# Patient Record
Sex: Male | Born: 1935 | Race: White | Hispanic: No | Marital: Married | Smoking: Never smoker
Health system: Southern US, Community
[De-identification: ages and names within clinical notes are randomized; demographics above are authoritative.]

## PROBLEM LIST (undated history)

## (undated) DIAGNOSIS — G4733 Obstructive sleep apnea (adult) (pediatric): Secondary | ICD-10-CM

## (undated) DIAGNOSIS — I10 Essential (primary) hypertension: Secondary | ICD-10-CM

## (undated) DIAGNOSIS — F32A Depression, unspecified: Secondary | ICD-10-CM

## (undated) DIAGNOSIS — G2581 Restless legs syndrome: Secondary | ICD-10-CM

## (undated) DIAGNOSIS — K810 Acute cholecystitis: Secondary | ICD-10-CM

## (undated) DIAGNOSIS — G4700 Insomnia, unspecified: Secondary | ICD-10-CM

## (undated) DIAGNOSIS — N179 Acute kidney failure, unspecified: Secondary | ICD-10-CM

## (undated) DIAGNOSIS — F0390 Unspecified dementia without behavioral disturbance: Secondary | ICD-10-CM

## (undated) DIAGNOSIS — R6520 Severe sepsis without septic shock: Secondary | ICD-10-CM

## (undated) DIAGNOSIS — A419 Sepsis, unspecified organism: Secondary | ICD-10-CM

## (undated) DIAGNOSIS — K219 Gastro-esophageal reflux disease without esophagitis: Secondary | ICD-10-CM

## (undated) DIAGNOSIS — F0280 Dementia in other diseases classified elsewhere without behavioral disturbance: Secondary | ICD-10-CM

## (undated) DIAGNOSIS — G4730 Sleep apnea, unspecified: Secondary | ICD-10-CM

## (undated) DIAGNOSIS — F329 Major depressive disorder, single episode, unspecified: Secondary | ICD-10-CM

## (undated) DIAGNOSIS — E785 Hyperlipidemia, unspecified: Secondary | ICD-10-CM

## (undated) DIAGNOSIS — R001 Bradycardia, unspecified: Secondary | ICD-10-CM

## (undated) DIAGNOSIS — I2510 Atherosclerotic heart disease of native coronary artery without angina pectoris: Secondary | ICD-10-CM

## (undated) DIAGNOSIS — G309 Alzheimer's disease, unspecified: Secondary | ICD-10-CM

## (undated) HISTORY — PX: EYE SURGERY: SHX253

## (undated) HISTORY — DX: Atherosclerotic heart disease of native coronary artery without angina pectoris: I25.10

## (undated) HISTORY — PX: CATARACT EXTRACTION, BILATERAL: SHX1313

## (undated) HISTORY — DX: Other disorders of bilirubin metabolism: E80.6

## (undated) HISTORY — DX: Severe sepsis without septic shock: R65.20

## (undated) HISTORY — PX: CORONARY ARTERY BYPASS GRAFT: SHX141

## (undated) HISTORY — DX: Acute kidney failure, unspecified: N17.9

## (undated) HISTORY — DX: Obstructive sleep apnea (adult) (pediatric): G47.33

## (undated) HISTORY — DX: Unspecified dementia without behavioral disturbance: F03.90

## (undated) HISTORY — DX: Sepsis, unspecified organism: A41.9

## (undated) HISTORY — PX: CARDIAC CATHETERIZATION: SHX172

## (undated) HISTORY — DX: Bradycardia, unspecified: R00.1

## (undated) HISTORY — DX: Acute cholecystitis: K81.0

## (undated) HISTORY — PX: BACK SURGERY: SHX140

---

## 2016-12-17 ENCOUNTER — Emergency Department (HOSPITAL_COMMUNITY): Payer: Medicare Other

## 2016-12-17 ENCOUNTER — Encounter (HOSPITAL_COMMUNITY): Payer: Self-pay | Admitting: Obstetrics and Gynecology

## 2016-12-17 ENCOUNTER — Inpatient Hospital Stay (HOSPITAL_COMMUNITY)
Admission: EM | Admit: 2016-12-17 | Discharge: 2016-12-25 | DRG: 854 | Disposition: A | Discharge status: Home / Self Care | Payer: Medicare Other | Attending: Internal Medicine | Admitting: Internal Medicine

## 2016-12-17 DIAGNOSIS — K829 Disease of gallbladder, unspecified: Secondary | ICD-10-CM

## 2016-12-17 DIAGNOSIS — G4700 Insomnia, unspecified: Secondary | ICD-10-CM | POA: Diagnosis present

## 2016-12-17 DIAGNOSIS — G309 Alzheimer's disease, unspecified: Secondary | ICD-10-CM | POA: Diagnosis present

## 2016-12-17 DIAGNOSIS — K812 Acute cholecystitis with chronic cholecystitis: Secondary | ICD-10-CM | POA: Diagnosis present

## 2016-12-17 DIAGNOSIS — I2510 Atherosclerotic heart disease of native coronary artery without angina pectoris: Secondary | ICD-10-CM | POA: Diagnosis present

## 2016-12-17 DIAGNOSIS — F0390 Unspecified dementia without behavioral disturbance: Secondary | ICD-10-CM

## 2016-12-17 DIAGNOSIS — Z6830 Body mass index (BMI) 30.0-30.9, adult: Secondary | ICD-10-CM | POA: Diagnosis not present

## 2016-12-17 DIAGNOSIS — R410 Disorientation, unspecified: Secondary | ICD-10-CM

## 2016-12-17 DIAGNOSIS — K810 Acute cholecystitis: Secondary | ICD-10-CM | POA: Diagnosis present

## 2016-12-17 DIAGNOSIS — K830 Cholangitis: Secondary | ICD-10-CM | POA: Diagnosis present

## 2016-12-17 DIAGNOSIS — K219 Gastro-esophageal reflux disease without esophagitis: Secondary | ICD-10-CM | POA: Diagnosis present

## 2016-12-17 DIAGNOSIS — E785 Hyperlipidemia, unspecified: Secondary | ICD-10-CM | POA: Diagnosis present

## 2016-12-17 DIAGNOSIS — I10 Essential (primary) hypertension: Secondary | ICD-10-CM | POA: Diagnosis present

## 2016-12-17 DIAGNOSIS — F03918 Unspecified dementia, unspecified severity, with other behavioral disturbance: Secondary | ICD-10-CM | POA: Diagnosis present

## 2016-12-17 DIAGNOSIS — G2581 Restless legs syndrome: Secondary | ICD-10-CM | POA: Diagnosis present

## 2016-12-17 DIAGNOSIS — G4730 Sleep apnea, unspecified: Secondary | ICD-10-CM | POA: Diagnosis present

## 2016-12-17 DIAGNOSIS — E872 Acidosis: Secondary | ICD-10-CM | POA: Diagnosis present

## 2016-12-17 DIAGNOSIS — K819 Cholecystitis, unspecified: Secondary | ICD-10-CM

## 2016-12-17 DIAGNOSIS — G4733 Obstructive sleep apnea (adult) (pediatric): Secondary | ICD-10-CM | POA: Diagnosis present

## 2016-12-17 DIAGNOSIS — K8309 Other cholangitis: Secondary | ICD-10-CM | POA: Diagnosis present

## 2016-12-17 DIAGNOSIS — Z79899 Other long term (current) drug therapy: Secondary | ICD-10-CM | POA: Diagnosis not present

## 2016-12-17 DIAGNOSIS — E876 Hypokalemia: Secondary | ICD-10-CM | POA: Diagnosis present

## 2016-12-17 DIAGNOSIS — Z7982 Long term (current) use of aspirin: Secondary | ICD-10-CM

## 2016-12-17 DIAGNOSIS — E669 Obesity, unspecified: Secondary | ICD-10-CM | POA: Diagnosis present

## 2016-12-17 DIAGNOSIS — R4182 Altered mental status, unspecified: Secondary | ICD-10-CM | POA: Diagnosis not present

## 2016-12-17 DIAGNOSIS — A419 Sepsis, unspecified organism: Secondary | ICD-10-CM | POA: Diagnosis present

## 2016-12-17 DIAGNOSIS — Z951 Presence of aortocoronary bypass graft: Secondary | ICD-10-CM | POA: Diagnosis not present

## 2016-12-17 DIAGNOSIS — R6520 Severe sepsis without septic shock: Secondary | ICD-10-CM | POA: Diagnosis present

## 2016-12-17 DIAGNOSIS — R400 Somnolence: Secondary | ICD-10-CM | POA: Diagnosis not present

## 2016-12-17 DIAGNOSIS — F0280 Dementia in other diseases classified elsewhere without behavioral disturbance: Secondary | ICD-10-CM | POA: Diagnosis present

## 2016-12-17 DIAGNOSIS — Z792 Long term (current) use of antibiotics: Secondary | ICD-10-CM | POA: Diagnosis not present

## 2016-12-17 DIAGNOSIS — F0391 Unspecified dementia with behavioral disturbance: Secondary | ICD-10-CM | POA: Diagnosis present

## 2016-12-17 DIAGNOSIS — R32 Unspecified urinary incontinence: Secondary | ICD-10-CM | POA: Diagnosis present

## 2016-12-17 DIAGNOSIS — K651 Peritoneal abscess: Secondary | ICD-10-CM

## 2016-12-17 DIAGNOSIS — N179 Acute kidney failure, unspecified: Secondary | ICD-10-CM

## 2016-12-17 DIAGNOSIS — R001 Bradycardia, unspecified: Secondary | ICD-10-CM

## 2016-12-17 HISTORY — DX: Atherosclerotic heart disease of native coronary artery without angina pectoris: I25.10

## 2016-12-17 HISTORY — DX: Alzheimer's disease, unspecified: G30.9

## 2016-12-17 HISTORY — DX: Acute cholecystitis: K81.0

## 2016-12-17 HISTORY — DX: Sepsis, unspecified organism: A41.9

## 2016-12-17 HISTORY — DX: Obstructive sleep apnea (adult) (pediatric): G47.33

## 2016-12-17 HISTORY — DX: Dementia in other diseases classified elsewhere, unspecified severity, without behavioral disturbance, psychotic disturbance, mood disturbance, and anxiety: F02.80

## 2016-12-17 HISTORY — DX: Restless legs syndrome: G25.81

## 2016-12-17 HISTORY — DX: Sleep apnea, unspecified: G47.30

## 2016-12-17 HISTORY — DX: Major depressive disorder, single episode, unspecified: F32.9

## 2016-12-17 HISTORY — DX: Insomnia, unspecified: G47.00

## 2016-12-17 HISTORY — DX: Unspecified dementia, unspecified severity, without behavioral disturbance, psychotic disturbance, mood disturbance, and anxiety: F03.90

## 2016-12-17 HISTORY — DX: Essential (primary) hypertension: I10

## 2016-12-17 HISTORY — DX: Gastro-esophageal reflux disease without esophagitis: K21.9

## 2016-12-17 HISTORY — DX: Hyperlipidemia, unspecified: E78.5

## 2016-12-17 HISTORY — DX: Depression, unspecified: F32.A

## 2016-12-17 HISTORY — DX: Sepsis, unspecified organism: R65.20

## 2016-12-17 LAB — CBC WITH DIFFERENTIAL/PLATELET
BASOS ABS: 0 10*3/uL (ref 0.0–0.1)
Basophils Relative: 0 %
EOS PCT: 0 %
Eosinophils Absolute: 0 10*3/uL (ref 0.0–0.7)
HEMATOCRIT: 44.8 % (ref 39.0–52.0)
Hemoglobin: 14.7 g/dL (ref 13.0–17.0)
LYMPHS ABS: 0.5 10*3/uL — AB (ref 0.7–4.0)
LYMPHS PCT: 4 %
MCH: 30.6 pg (ref 26.0–34.0)
MCHC: 32.8 g/dL (ref 30.0–36.0)
MCV: 93.1 fL (ref 78.0–100.0)
Monocytes Absolute: 0.7 10*3/uL (ref 0.1–1.0)
Monocytes Relative: 5 %
NEUTROS ABS: 11.7 10*3/uL — AB (ref 1.7–7.7)
Neutrophils Relative %: 91 %
PLATELETS: 159 10*3/uL (ref 150–400)
RBC: 4.81 MIL/uL (ref 4.22–5.81)
RDW: 13.5 % (ref 11.5–15.5)
WBC: 12.9 10*3/uL — AB (ref 4.0–10.5)

## 2016-12-17 LAB — URINALYSIS, ROUTINE W REFLEX MICROSCOPIC
BILIRUBIN URINE: NEGATIVE
Glucose, UA: NEGATIVE mg/dL
HGB URINE DIPSTICK: NEGATIVE
KETONES UR: NEGATIVE mg/dL
LEUKOCYTES UA: NEGATIVE
Nitrite: NEGATIVE
PROTEIN: 30 mg/dL — AB
SPECIFIC GRAVITY, URINE: 1.026 (ref 1.005–1.030)
Squamous Epithelial / HPF: NONE SEEN
pH: 5 (ref 5.0–8.0)

## 2016-12-17 LAB — COMPREHENSIVE METABOLIC PANEL
ALBUMIN: 3.7 g/dL (ref 3.5–5.0)
ALT: 16 U/L — ABNORMAL LOW (ref 17–63)
ALT: 14 U/L — AB (ref 17–63)
ANION GAP: 9 (ref 5–15)
AST: 26 U/L (ref 15–41)
AST: 24 U/L (ref 15–41)
Albumin: 3.1 g/dL — ABNORMAL LOW (ref 3.5–5.0)
Alkaline Phosphatase: 105 U/L (ref 38–126)
Alkaline Phosphatase: 86 U/L (ref 38–126)
Anion gap: 9 (ref 5–15)
BILIRUBIN TOTAL: 1.4 mg/dL — AB (ref 0.3–1.2)
BUN: 26 mg/dL — ABNORMAL HIGH (ref 6–20)
BUN: 26 mg/dL — AB (ref 6–20)
CALCIUM: 8 mg/dL — AB (ref 8.9–10.3)
CHLORIDE: 104 mmol/L (ref 101–111)
CO2: 24 mmol/L (ref 22–32)
CO2: 24 mmol/L (ref 22–32)
CREATININE: 1.45 mg/dL — AB (ref 0.61–1.24)
Calcium: 8.7 mg/dL — ABNORMAL LOW (ref 8.9–10.3)
Chloride: 106 mmol/L (ref 101–111)
Creatinine, Ser: 1.42 mg/dL — ABNORMAL HIGH (ref 0.61–1.24)
GFR calc Af Amer: 51 mL/min — ABNORMAL LOW (ref >60)
GFR calc Af Amer: 50 mL/min — ABNORMAL LOW (ref >60)
GFR, EST NON AFRICAN AMERICAN: 44 mL/min — AB (ref >60)
GFR, EST NON AFRICAN AMERICAN: 43 mL/min — AB (ref >60)
GLUCOSE: 143 mg/dL — AB (ref 65–99)
Glucose, Bld: 141 mg/dL — ABNORMAL HIGH (ref 65–99)
POTASSIUM: 4.8 mmol/L (ref 3.5–5.1)
POTASSIUM: 4.6 mmol/L (ref 3.5–5.1)
Sodium: 137 mmol/L (ref 135–145)
Sodium: 139 mmol/L (ref 135–145)
TOTAL PROTEIN: 7.2 g/dL (ref 6.5–8.1)
TOTAL PROTEIN: 6.4 g/dL — AB (ref 6.5–8.1)
Total Bilirubin: 1.9 mg/dL — ABNORMAL HIGH (ref 0.3–1.2)

## 2016-12-17 LAB — BLOOD GAS, VENOUS
Acid-base deficit: 0.4 mmol/L (ref 0.0–2.0)
BICARBONATE: 24.4 mmol/L (ref 20.0–28.0)
O2 Saturation: 62.1 %
PATIENT TEMPERATURE: 98.6
PH VEN: 7.373 (ref 7.250–7.430)
pCO2, Ven: 43 mmHg — ABNORMAL LOW (ref 44.0–60.0)
pO2, Ven: 32.3 mmHg (ref 32.0–45.0)

## 2016-12-17 LAB — I-STAT CG4 LACTIC ACID, ED
LACTIC ACID, VENOUS: 2.23 mmol/L — AB (ref 0.5–1.9)
Lactic Acid, Venous: 2.25 mmol/L (ref 0.5–1.9)

## 2016-12-17 LAB — PROTIME-INR
INR: 1.17
Prothrombin Time: 15 seconds (ref 11.4–15.2)

## 2016-12-17 LAB — LACTIC ACID, PLASMA: LACTIC ACID, VENOUS: 1.6 mmol/L (ref 0.5–1.9)

## 2016-12-17 LAB — LIPASE, BLOOD: Lipase: 32 U/L (ref 11–51)

## 2016-12-17 LAB — PROCALCITONIN: Procalcitonin: 2.39 ng/mL

## 2016-12-17 LAB — APTT: aPTT: 32 seconds (ref 24–36)

## 2016-12-17 MED ORDER — SODIUM CHLORIDE 0.9% FLUSH
3.0000 mL | Freq: Two times a day (BID) | INTRAVENOUS | Status: DC
  Administered 2016-12-17 – 2016-12-24 (×9): 3 mL via INTRAVENOUS

## 2016-12-17 MED ORDER — HYDROMORPHONE HCL 1 MG/ML IJ SOLN: 0.5000 mg | INTRAMUSCULAR | Status: DC | PRN

## 2016-12-17 MED ORDER — CHLORHEXIDINE GLUCONATE 0.12 % MT SOLN
15.0000 mL | Freq: Two times a day (BID) | OROMUCOSAL | Status: DC
  Administered 2016-12-18 – 2016-12-25 (×12): 15 mL via OROMUCOSAL
  Filled 2016-12-17 (×12): qty 15

## 2016-12-17 MED ORDER — IOPAMIDOL (ISOVUE-300) INJECTION 61%
INTRAVENOUS | Status: AC
  Filled 2016-12-17: qty 75

## 2016-12-17 MED ORDER — VANCOMYCIN HCL 10 G IV SOLR
1500.0000 mg | Freq: Once | INTRAVENOUS | Status: AC
  Administered 2016-12-17: 1500 mg via INTRAVENOUS
  Filled 2016-12-17: qty 1500

## 2016-12-17 MED ORDER — ACETAMINOPHEN 325 MG PO TABS
650.0000 mg | ORAL_TABLET | Freq: Four times a day (QID) | ORAL | Status: DC | PRN
  Administered 2016-12-20: 650 mg via ORAL
  Filled 2016-12-17: qty 2

## 2016-12-17 MED ORDER — ORAL CARE MOUTH RINSE
15.0000 mL | Freq: Two times a day (BID) | OROMUCOSAL | Status: DC
  Administered 2016-12-18 – 2016-12-24 (×8): 15 mL via OROMUCOSAL

## 2016-12-17 MED ORDER — ACETAMINOPHEN 650 MG RE SUPP
650.0000 mg | Freq: Four times a day (QID) | RECTAL | Status: DC | PRN
  Administered 2016-12-18: 650 mg via RECTAL
  Filled 2016-12-17: qty 1

## 2016-12-17 MED ORDER — PIPERACILLIN-TAZOBACTAM 3.375 G IVPB
3.3750 g | Freq: Three times a day (TID) | INTRAVENOUS | Status: AC
  Administered 2016-12-17 – 2016-12-24 (×20): 3.375 g via INTRAVENOUS
  Filled 2016-12-17 (×19): qty 50

## 2016-12-17 MED ORDER — PIPERACILLIN-TAZOBACTAM 3.375 G IVPB 30 MIN
3.3750 g | Freq: Once | INTRAVENOUS | Status: AC
  Administered 2016-12-17: 3.375 g via INTRAVENOUS
  Filled 2016-12-17: qty 50

## 2016-12-17 MED ORDER — ONDANSETRON HCL 4 MG PO TABS
4.0000 mg | ORAL_TABLET | Freq: Four times a day (QID) | ORAL | Status: DC | PRN
  Filled 2016-12-17: qty 1

## 2016-12-17 MED ORDER — SODIUM CHLORIDE 0.9 % IV SOLN
INTRAVENOUS | Status: AC
  Administered 2016-12-17: 23:00:00 via INTRAVENOUS

## 2016-12-17 MED ORDER — ONDANSETRON HCL 4 MG/2ML IJ SOLN: 4.0000 mg | Freq: Four times a day (QID) | INTRAMUSCULAR | Status: DC | PRN

## 2016-12-17 MED ORDER — SODIUM CHLORIDE 0.9 % IV SOLN
Freq: Once | INTRAVENOUS | Status: AC
  Administered 2016-12-17: 22:00:00 via INTRAVENOUS

## 2016-12-17 MED ORDER — SODIUM CHLORIDE 0.9 % IV BOLUS (SEPSIS)
500.0000 mL | Freq: Once | INTRAVENOUS | Status: AC
  Administered 2016-12-17: 500 mL via INTRAVENOUS

## 2016-12-17 MED ORDER — IOPAMIDOL (ISOVUE-300) INJECTION 61%
75.0000 mL | Freq: Once | INTRAVENOUS | Status: AC | PRN
  Administered 2016-12-17: 75 mL via INTRAVENOUS

## 2016-12-17 MED ORDER — QUETIAPINE FUMARATE 50 MG PO TABS: 25.0000 mg | ORAL_TABLET | Freq: Every day | ORAL | Status: DC

## 2016-12-17 NOTE — ED Notes (Signed)
Hospitalist at bedside 

## 2016-12-17 NOTE — ED Notes (Signed)
Patient transported to CT 

## 2016-12-17 NOTE — Progress Notes (Signed)
Pharmacy Antibiotic Note  Travis MoloneyJohn Wallace is a 83 y.o. male with hx dementia presented to the ED on 12/17/2016 with c/o AMS. Abd CT with findings consistent with acute cholecystitis.  To start zosyn for intra-abdominal infection.   Plan: - zosyn 3.375 gm IV q8h (infuse over 4 hours) - monitor renal function  ______________________________  Height: 5\' 4"  (162.6 cm) Weight: 180 lb (81.6 kg) IBW/kg (Calculated) : 59.2  Temp (24hrs), Avg:97.7 F (36.5 C), Min:97.7 F (36.5 C), Max:97.7 F (36.5 C)   Recent Labs Lab 12/17/16 1154 12/17/16 1205  WBC 12.9*  --   CREATININE 1.42*  --   LATICACIDVEN  --  2.25*    Estimated Creatinine Clearance: 38 mL/min (A) (by C-G formula based on SCr of 1.42 mg/dL (H)).    No Known Allergies  Thank you for allowing pharmacy to be a part of this patient's care.  Travis Wallace, Travis Wallace 12/17/2016 4:36 PM

## 2016-12-17 NOTE — ED Notes (Signed)
Attempted an IV x 2 with no success. Will place for IV team since two nurses has attempted to obtain an IV.

## 2016-12-17 NOTE — ED Provider Notes (Signed)
Notified by surgery recommendation to admit to hospitalist. Labs, records reviewed in detail. Patient is here with acute, uncomplicated cholecystitis with concern for developing cholangitis, with fever, leukocytosis, and lactic acidosis. I have ordered IVF, mIVF, and Zosyn has been given. Awaiting Hospitalist admit.   Shaune PollackIsaacs, Leyland Kenna, MD 12/17/16 [phone]
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
## 2016-12-17 NOTE — ED Notes (Signed)
Attempted IV X2. Unsuccessful. 

## 2016-12-17 NOTE — ED Triage Notes (Signed)
Per EMS:  Pt coming from [place]: Pt is lethargic and has been sleeping more Pt is able to get up and walk around normally, but today he is not   116/47 97% on 2L 64 HR 136 CBG Temp: 101.4

## 2016-12-17 NOTE — ED Notes (Signed)
Pt's family reports that the pt "is sun downing" and he needs his Seroquel and something for restless leg.

## 2016-12-17 NOTE — H&P (Signed)
Travis Wallace QIO:[id number] DOB: 08/02/1930 DOA: 12/17/2016     PCP: Doreen Salvage, PA-C   Outpatient Specialists: none Patient coming from:  From facility [facility]  Chief Complaint: Fever increased confusion and weakness generalized  HPI: Travis Wallace is a 83 y.o. male with medical history significant of CAD,  HTN, HLD, dementia  Presented with 1-2 day history of worsening confusion and lethargy fevers he has not been abdomen endorsing chest pain or abdominal pain but unable to provide detailed history. Family 2 days ago she was noted to be more lethargic and was seen at [facility] hospital day 5 that he may have some sludge in his gallbladder but at that time showed no acute cholecystitis he was at that point complaining of some epigastric/chest pain and evaluated for that small from a standpoint of ACS rule out. He continued to she'll poorly and developed a fever he was too weak to stand up    Regarding pertinent Chronic problems: History of coronary artery disease had CABG at age 30 his been living in memory unit for past 3 years secondary to dementia   IN ER:  Temp (24hrs), Avg:98.5 F (36.9 C), Min:97.7 F (36.5 C), Max:99.3 F (37.4 C)      on arrival  ED Triage Vitals  Enc Vitals Group     BP 12/17/16 1147 (!) 135/48     Pulse Rate 12/17/16 1147 71     Resp 12/17/16 1147 17     Temp 12/17/16 1147 97.7 F (36.5 C)     Temp Source 12/17/16 1147 Oral     SpO2 12/17/16 1147 93 %     Weight 12/17/16 1437 180 lb (81.6 kg)     Height 12/17/16 1437 5\' 4"  (1.626 m)     Head Circumference --      Peak Flow --      Pain Score --      Pain Loc --      Pain Edu? --      Excl. in GC? --   RRR 12 satting 91% HR 77 BP 106/59 Lactic acid 2.25 now 2.23 Sodium 139  K4.6 creatinine 1.4 to a plan 1.1   2 days ago WBC 12.9 hemoglobin 14.7 VBG 7.373/43 CT abdomen acute cholecystitis Chest x-ray nonacute CT non acute Following Medications were ordered in ER: Medications    iopamidol (ISOVUE-300) 61 % injection (not administered)  piperacillin-tazobactam (ZOSYN) IVPB 3.375 g (not administered)  QUEtiapine (SEROQUEL) tablet 25 mg (not administered)  0.9 %  sodium chloride infusion (not administered)  sodium chloride 0.9 % bolus 500 mL (not administered)  piperacillin-tazobactam (ZOSYN) IVPB 3.375 g (0 g Intravenous Stopped 12/17/16 1533)  vancomycin (VANCOCIN) 1,500 mg in sodium chloride 0.9 % 500 mL IVPB (0 mg Intravenous Stopped 12/17/16 1833)  iopamidol (ISOVUE-300) 61 % injection 75 mL (75 mLs Intravenous Contrast Given 12/17/16 1406)     ER provider discussed case with:  General surgery who recommended admission to medicine on  Antibiotics and they will follow  Hospitalist was called for admission for acute cholecystitis  Review of Systems:    Pertinent positives include: Fevers, chills, fatigue,   Constitutional:  No weight loss, night sweats,weight loss  HEENT:  No headaches, Difficulty swallowing,Tooth/dental problems,Sore throat,  No sneezing, itching, ear ache, nasal congestion, post nasal drip,  Cardio-vascular:  No chest pain, Orthopnea, PND, anasarca, dizziness, palpitations.no Bilateral lower extremity swelling  GI:  No heartburn, indigestion, abdominal pain, nausea, vomiting, diarrhea, change in bowel habits,  loss of appetite, melena, blood in stool, hematemesis Resp:  no shortness of breath at rest. No dyspnea on exertion, No excess mucus, no productive cough, No non-productive cough, No coughing up of blood.No change in color of mucus.No wheezing. Skin:  no rash or lesions. No jaundice GU:  no dysuria, change in color of urine, no urgency or frequency. No straining to urinate.  No flank pain.  Musculoskeletal:  No joint pain or no joint swelling. No decreased range of motion. No back pain.  Psych:  No change in mood or affect. No depression or anxiety. No memory loss.  Neuro: no localizing neurological complaints, no tingling, no  weakness, no double vision, no gait abnormality, no slurred speech, no confusion  As per HPI otherwise 10 point review of systems negative.   Past Medical History: Past Medical History:  Diagnosis Date  . Alzheimer's dementia   . Coronary artery disease   . Depression   . Dyslipidemia   . GERD (gastroesophageal reflux disease)   . Hypertension   . Insomnia   . Restless leg   . Sleep apnea    Past Surgical History:  Procedure Laterality Date  . BACK SURGERY     x2  . CARDIAC CATHETERIZATION    . CATARACT EXTRACTION, BILATERAL    . CORONARY ARTERY BYPASS GRAFT     X 3 VESSELS  . EYE SURGERY       Social History:  Ambulatory  walker       reports that he has never smoked. He has never used smokeless tobacco. He reports that he does not drink alcohol or use drugs.  Allergies:  No Known Allergies     Family History:   Family History  Problem Relation Age of Onset  . Pancreatic cancer Mother   . Hypertension Father   . CAD Father   . Stroke Father   . CAD Sister   . Diabetes Neg Hx     Medications: Prior to Admission medications   Medication Sig Start Date End Date Taking? Authorizing Provider  amLODipine-benazepril (LOTREL) 5-20 MG capsule Take 1 capsule by mouth daily. 11/03/14  Yes [provider]  aspirin EC 81 MG tablet Take 81 mg by mouth daily.   Yes [provider]  atorvastatin (LIPITOR) 20 MG tablet Take 20 mg by mouth at bedtime.   Yes [provider]  cetirizine (ZYRTEC) 10 MG tablet Take 10 mg by mouth daily.   Yes [provider]  doxazosin (CARDURA) 4 MG tablet Take 4 mg by mouth daily.   Yes [provider]  lactulose (CHRONULAC) 10 GM/15ML solution Take 30 mLs by mouth daily. 11/13/16  Yes [provider]  lansoprazole (PREVACID) 30 MG capsule Take 30 mg by mouth daily.   Yes [provider]  magnesium gluconate (MAGONATE) 500 MG tablet Take 500 mg by mouth daily.   Yes [provider]  Memantine HCl-Donepezil HCl (NAMZARIC) 28-10 MG CP24 Take 1 capsule by mouth daily.   Yes [provider]  niacin 500 MG tablet Take 500 mg by mouth daily.   Yes [provider]  QUEtiapine (SEROQUEL) 25 MG tablet Take 75 mg by mouth at bedtime. 12/05/16  Yes [provider]  QUEtiapine (SEROQUEL) 25 MG tablet Take 25 mg by mouth daily as needed (restlessness).   Yes [provider]  REQUIP 1 MG tablet Take 1 mg by mouth at bedtime.  11/07/16  Yes [provider]  traMADol Janean Sark) 50  MG tablet Take 50 mg by mouth 2 (two) times daily. 12/04/16  Yes [provider]    Physical Exam: Patient Vitals for the past 24 hrs:  BP Temp Temp src Pulse Resp SpO2 Height Weight  12/17/16 1943 (!) 104/59 99.3 F (37.4 C) Oral 77 12 91 % - -  12/17/16 1530 116/62 - - 85 (!) 25 92 % - -  12/17/16 1506 114/62 - - - 17 - - -  12/17/16 1449 (!) 139/54 - - 68 19 94 % - -  12/17/16 1437 - - - - - - 5\' 4"  (1.626 m) 81.6 kg (180 lb)  12/17/16 1147 (!) 135/48 97.7 F (36.5 C) Oral 71 17 93 % - -    1. General: Patient moaning 2. Psychological: Alert but not Oriented 3. Head/ENT:   Dry Mucous Membranes                          Head Non traumatic, neck supple                          Poor Dentition 4. SKIN: decreased Skin turgor,  Skin clean Dry and intact no rash 5. Heart: Regular rate and rhythm no Murmur, Rub or gallop 6. Lungs:   no wheezes or crackles   7. Abdomen: Soft, RUQ tenderness,  Distended With diminished bowel sounds 8. Lower extremities: no clubbing, cyanosis, or edema 9. Neurologically Grossly intact, moving all 4 extremities equally  10. MSK: Normal range of motion   body mass index is 30.9 kg/m.  Labs on Admission:   Labs on Admission: I have personally reviewed following labs and imaging studies  CBC:  Recent Labs Lab 12/17/16 1154  WBC 12.9*  NEUTROABS 11.7*  HGB 14.7  HCT 44.8  MCV 93.1  PLT 159   Basic  Metabolic Panel:  Recent Labs Lab 12/17/16 1154 12/17/16 1700  NA 137 139  K 4.8 4.6  CL 104 106  CO2 24 24  GLUCOSE 143* 141*  BUN 26* 26*  CREATININE 1.42* 1.45*  CALCIUM 8.7* 8.0*   GFR: Estimated Creatinine Clearance: 37.2 mL/min (A) (by C-G formula based on SCr of 1.45 mg/dL (H)). Liver Function Tests:  Recent Labs Lab 12/17/16 1154 12/17/16 1700  AST 26 24  ALT 16* 14*  ALKPHOS 105 86  BILITOT 1.9* 1.4*  PROT 7.2 6.4*  ALBUMIN 3.7 3.1*    Recent Labs Lab 12/17/16 1154  LIPASE 32   No results for input(s): AMMONIA in the last 168 hours. Coagulation Profile: No results for input(s): INR, PROTIME in the last 168 hours. Cardiac Enzymes: No results for input(s): CKTOTAL, CKMB, CKMBINDEX, TROPONINI in the last 168 hours. BNP (last 3 results) No results for input(s): PROBNP in the last 8760 hours. HbA1C: No results for input(s): HGBA1C in the last 72 hours. CBG: No results for input(s): GLUCAP in the last 168 hours. Lipid Profile: No results for input(s): CHOL, HDL, LDLCALC, TRIG, CHOLHDL, LDLDIRECT in the last 72 hours. Thyroid Function Tests: No results for input(s): TSH, T4TOTAL, FREET4, T3FREE, THYROIDAB in the last 72 hours. Anemia Panel: No results for input(s): VITAMINB12, FOLATE, FERRITIN, TIBC, IRON, RETICCTPCT in the last 72 hours. Urine analysis:    Component Value Date/Time   COLORURINE YELLOW 12/17/2016 1154   APPEARANCEUR CLEAR 12/17/2016 1154   LABSPEC 1.026 12/17/2016 1154   PHURINE 5.0 12/17/2016 1154   GLUCOSEU NEGATIVE 12/17/2016 1154  HGBUR NEGATIVE 12/17/2016 1154   BILIRUBINUR NEGATIVE 12/17/2016 1154   KETONESUR NEGATIVE 12/17/2016 1154   PROTEINUR 30 (A) 12/17/2016 1154   NITRITE NEGATIVE 12/17/2016 1154   LEUKOCYTESUR NEGATIVE 12/17/2016 1154   Sepsis Labs: @LABRCNTIP (procalcitonin:4,lacticidven:4) )No results found for this or any previous visit (from the past 240 hour(s)).     UA no evidence of UTI    No results  found for: HGBA1C  Estimated Creatinine Clearance: 37.2 mL/min (A) (by C-G formula based on SCr of 1.45 mg/dL (H)).  BNP (last 3 results) No results for input(s): PROBNP in the last 8760 hours.   ECG REPORT  Independently reviewed Rate:57  Rhythm: Sinus bradycardia and PACs ST&T Change: No acute ischemic changes  QTC 372  Filed Weights   12/17/16 1437  Weight: 81.6 kg (180 lb)     Cultures: No results found for: SDES, SPECREQUEST, CULT, REPTSTATUS   Radiological Exams on Admission: Dg Chest 2 View  Result Date: 12/17/2016 CLINICAL DATA:  Sepsis.  Lethargic. EXAM: CHEST  2 VIEW COMPARISON:  None FINDINGS: Two views of the chest demonstrate low lung volumes. No focal airspace disease or frank pulmonary edema. Prior median sternotomy. Surgical hardware in the lumbar spine. Aortic atherosclerosis. Heart size is within normal limits. IMPRESSION: Low lung volumes.  No acute findings. Electronically Signed   By: Richarda OverlieAdam  Henn M.D.   On: 12/17/2016 13:17   Ct Head Wo Contrast  Result Date: 12/17/2016 CLINICAL DATA:  Sepsis EXAM: CT HEAD WITHOUT CONTRAST TECHNIQUE: Contiguous axial images were obtained from the base of the skull through the vertex without intravenous contrast. COMPARISON:  None. FINDINGS: Brain: Moderate atrophy. Negative for hydrocephalus. Mild chronic microvascular ischemic change in the white matter and basal ganglia. Negative for acute infarct.  Negative for hemorrhage or mass. Vascular: Negative for hyperdense vessel Skull: Negative Sinuses/Orbits: Mucous retention cyst right maxillary sinus. Air-fluid level in the sphenoid sinus. Other: None IMPRESSION: Atrophy and chronic microvascular ischemic change. No acute intracranial abnormality Air-fluid level sphenoid sinus. Electronically Signed   By: Marlan Palauharles  Clark M.D.   On: 12/17/2016 12:37   Ct Abdomen Pelvis W Contrast  Result Date: 12/17/2016 CLINICAL DATA:  Upper quadrant pain. EXAM: CT ABDOMEN AND PELVIS WITH CONTRAST  TECHNIQUE: Multidetector CT imaging of the abdomen and pelvis was performed using the standard protocol following bolus administration of intravenous contrast. CONTRAST:  75mL ISOVUE-300 IOPAMIDOL (ISOVUE-300) INJECTION 61% COMPARISON:  None available FINDINGS: Lower chest: Trace bilateral effusions greater on the RIGHT. Hepatobiliary: Several low-density lesions in liver consistent benign hepatic cyst. No biliary duct dilatation. Gallbladder is distended to 4 cm. There is pericholecystic inflammation in the fat adjacent to the gallbladder. There is a calculus in the neck of the gallbladder measuring 10 mm (image 25, series 3). Small amount pericholecystic fluid. The distal common bile duct is upper limits normal at 6 mm. No common bile duct stone identified by CT. Pancreas: No pancreatic inflammation or duct dilatation. Spleen: Normal spleen Adrenals/urinary tract: Adrenal glands and kidneys are normal. The ureters and bladder normal. Stomach/Bowel: Stomach and small bowel are normal. Appendix not identified. Surgical clips in the cecal region. Colon rectosigmoid colon normal. Vascular/Lymphatic: Abdominal aorta is normal caliber with atherosclerotic calcification. There is no retroperitoneal or periportal lymphadenopathy. No pelvic lymphadenopathy. Reproductive: Prostate normal Other: No free fluid. Musculoskeletal: No aggressive osseous lesion. IMPRESSION: 1. Findings consistent with acute cholecystitis. 2. Common bile duct upper limits of normal. 3.  Aortic Atherosclerosis (ICD10-I70.0). Electronically Signed   By: Genevive BiStewart  Edmunds  M.D.   On: 12/17/2016 14:35    Chart has been reviewed    Assessment/Plan  83 y.o. male with medical history significant of CAD,  HTN, HLD, dementia Admitted for sepsis due to cholecystitis surgery is aware  Present on Admission: . Acute cholecystitis - appreciate surgical consult, will treat with IV antibiotics were today continue Zosyn, await results of blood cultures,  keep nothing by mouth and rehydrate. defer to surgery  regarding    timing of cholecystectomy  . Severe sepsis (HCC) -  Admit per Sepsis protocol likely source being  intra-abdominal infection  - rehydrate with 25ml/kg  - initiate broad spectrum antibiotics  Zosyn  -  obtain blood cultures  - Obtain serial lactic acid  - Obtain procalcitonin level  - Admit and monitor vital signs closely  - PCCM has been made aware  Sepsis - Repeat Assessment  Performed at:    2100  Vitals     Blood pressure (!) 128/110, pulse 72, temperature 98.8 F (37.1 C), temperature source Oral, resp. rate (!) 21, height 5\' 4"  (1.626 m), weight 80.2 kg (176 lb 12.9 oz), SpO2 96 %.  Heart:     Tachycardic  Lungs:    CTA  Capillary Refill:   <2 sec  Peripheral Pulse:   Radial pulse palpable  Skin:     Flushed    . CAD (coronary artery disease) - stable, hold home medications while NPO, per wife has been asymptomatic since original CABG years ago,  . HTN (hypertension) - hold home medications while septic allow  permissive hypertension rehydrate . OSA (obstructive sleep apnea)- patient does not tolerate C Pap  . Dementia - expect some degree of sundowning, patient altered at baseline but due to infection have been more significantly confused.   Other plan as per orders.  DVT prophylaxis:  SCD    Code Status:  FULL COD as per family   Family Communication:   Family   at  Bedside  plan of care was discussed with   Daughter,   Disposition Plan:                              Back to current facility when stable                                                   Social Work    consulted                          Consults called: General surgery, PCCM aware for E-link  Admission status:   inpatient      Level of care       SDU      I have spent a total of 66 min on this admission  time was taken to discuss case with consultants  Natally Ribera 12/18/2016, 12:29 AM    [facility]    Pager [phone]   after 2 AM please page floor coverage PA If 7AM-7PM, please contact the day team taking care of the patient  [email or web address]  Password [email or web address]

## 2016-12-17 NOTE — ED Provider Notes (Signed)
[facility]-EMERGENCY DEPT Provider Note   CSN: [id number] Arrival date & time: 12/17/16  1131     History   Chief Complaint Chief Complaint  Patient presents with  . Fatigue    HPI Travis MoloneyJohn Wallace is a 83 y.o. male.  83 yo M with chief complaints of fatigue. Level V caveat altered mental status. Per the nursing home the patient has been febrile and sleeping more than normal. No further history obtained by the nursing home.   The history is provided by the patient.  Illness  This is a new problem. The current episode started 12 to 24 hours ago. The problem occurs constantly. The problem has not changed since onset.Pertinent negatives include no chest pain, no abdominal pain, no headaches and no shortness of breath. Nothing aggravates the symptoms. Nothing relieves the symptoms. He has tried nothing for the symptoms. The treatment provided no relief.    History reviewed. No pertinent past medical history.  There are no active problems to display for this patient.   History reviewed. No pertinent surgical history.     Home Medications    Prior to Admission medications   Medication Sig Start Date End Date Taking? Authorizing Provider  amLODipine-benazepril (LOTREL) 5-20 MG capsule Take 1 capsule by mouth daily. 11/03/14  Yes [provider]  aspirin EC 81 MG tablet Take 81 mg by mouth daily.   Yes [provider]  atorvastatin (LIPITOR) 20 MG tablet Take 20 mg by mouth at bedtime.   Yes [provider]  cetirizine (ZYRTEC) 10 MG tablet Take 10 mg by mouth daily.   Yes [provider]  doxazosin (CARDURA) 4 MG tablet Take 4 mg by mouth daily.   Yes [provider]  lactulose (CHRONULAC) 10 GM/15ML solution Take 30 mLs by mouth daily. 11/13/16  Yes [provider]  lansoprazole (PREVACID) 30 MG capsule Take 30 mg by mouth daily.   Yes [provider]  magnesium gluconate (MAGONATE) 500 MG tablet Take 500 mg by mouth daily.    Yes [provider]  Memantine HCl-Donepezil HCl (NAMZARIC) 28-10 MG CP24 Take 1 capsule by mouth daily.   Yes [provider]  niacin 500 MG tablet Take 500 mg by mouth daily.   Yes [provider]  QUEtiapine (SEROQUEL) 25 MG tablet Take 75 mg by mouth at bedtime. 12/05/16  Yes [provider]  QUEtiapine (SEROQUEL) 25 MG tablet Take 25 mg by mouth daily as needed (restlessness).   Yes [provider]  REQUIP 1 MG tablet Take 1 mg by mouth at bedtime.  11/07/16  Yes [provider]  traMADol (ULTRAM) 50 MG tablet Take 50 mg by mouth 2 (two) times daily. 12/04/16  Yes [provider]    Family History No family history on file.  Social History Social History  Substance Use Topics  . Smoking status: Not on file  . Smokeless tobacco: Not on file  . Alcohol use No     Allergies   Patient has no known allergies.   Review of Systems Review of Systems  Unable to perform ROS: Mental status change  Constitutional: Negative for chills and fever.  HENT: Negative for congestion and facial swelling.   Eyes: Negative for discharge and visual disturbance.  Respiratory: Negative for shortness of breath.   Cardiovascular: Negative for chest pain and palpitations.  Gastrointestinal: Negative for abdominal pain, diarrhea and vomiting.  Musculoskeletal: Negative for arthralgias and myalgias.  Skin: Negative for color change and rash.  Neurological: Negative for tremors, syncope and headaches.  Psychiatric/Behavioral: Negative for confusion and dysphoric mood.     Physical Exam Updated Vital Signs BP (!) 139/54 (BP Location: Right Arm)   Pulse 68   Temp 97.7 F (36.5 C) (Oral)   Resp 19   Ht 5\' 4"  (1.626 m)   Wt 81.6 kg (180 lb)   SpO2 94%   BMI 30.90 kg/m   Physical Exam  Constitutional: He is oriented to person, place, and time. He appears well-developed and well-nourished.  HENT:  Head: Normocephalic and atraumatic.    Eyes: EOM are normal. Pupils are equal, round, and reactive to light.  Neck: Normal range of motion. Neck supple. No JVD present.  Cardiovascular: Normal rate and regular rhythm.  Exam reveals no gallop and no friction rub.   No murmur heard. Pulmonary/Chest: No respiratory distress. He has no wheezes.  Abdominal: He exhibits no distension and no mass. There is tenderness (RUQ). There is no rebound and no guarding.  Musculoskeletal: Normal range of motion.  Neurological: He is alert and oriented to person, place, and time.  Skin: No rash noted. No pallor.  Psychiatric: He has a normal mood and affect. His behavior is normal.  Nursing note and vitals reviewed.    ED Treatments / Results  Labs (all labs ordered are listed, but only abnormal results are displayed) Labs Reviewed  COMPREHENSIVE METABOLIC PANEL - Abnormal; Notable for the following:       Result Value   Glucose, Bld 143 (*)    BUN 26 (*)    Creatinine, Ser 1.42 (*)    Calcium 8.7 (*)    ALT 16 (*)    Total Bilirubin 1.9 (*)    GFR calc non Af Amer 44 (*)    GFR calc Af Amer 51 (*)    All other components within normal limits  CBC WITH DIFFERENTIAL/PLATELET - Abnormal; Notable for the following:    WBC 12.9 (*)    Neutro Abs 11.7 (*)    Lymphs Abs 0.5 (*)    All other components within normal limits  URINALYSIS, ROUTINE W REFLEX MICROSCOPIC - Abnormal; Notable for the following:    Protein, ur 30 (*)    Bacteria, UA MANY (*)    All other components within normal limits  BLOOD GAS, VENOUS - Abnormal; Notable for the following:    pCO2, Ven 43.0 (*)    All other components within normal limits  I-STAT CG4 LACTIC ACID, ED - Abnormal; Notable for the following:    Lactic Acid, Venous 2.25 (*)    All other components within normal limits  CULTURE, BLOOD (ROUTINE X 2)  CULTURE, BLOOD (ROUTINE X 2)  URINE CULTURE  LIPASE, BLOOD  I-STAT CG4 LACTIC ACID, ED    EKG  EKG Interpretation  Date/Time:  Wednesday  December 17 2016 12:50:51 EDT Ventricular Rate:  57 PR Interval:    QRS Duration: 85 QT Interval:  382 QTC Calculation: 372 R Axis:   4 Text Interpretation:  Sinus rhythm Atrial premature complexes No old tracing to compare Confirmed by Melene Plan [phone]) on 12/17/2016 2:19:27 PM       Radiology Dg Chest 2 View  Result Date: 12/17/2016 CLINICAL DATA:  Sepsis.  Lethargic. EXAM: CHEST  2 VIEW COMPARISON:  None FINDINGS: Two views of the chest demonstrate low lung volumes. No focal airspace disease or frank pulmonary edema. Prior median sternotomy. Surgical hardware in the lumbar spine. Aortic atherosclerosis. Heart size is within normal  limits. IMPRESSION: Low lung volumes.  No acute findings. Electronically Signed   By: Richarda Overlie M.D.   On: 12/17/2016 13:17   Ct Head Wo Contrast  Result Date: 12/17/2016 CLINICAL DATA:  Sepsis EXAM: CT HEAD WITHOUT CONTRAST TECHNIQUE: Contiguous axial images were obtained from the base of the skull through the vertex without intravenous contrast. COMPARISON:  None. FINDINGS: Brain: Moderate atrophy. Negative for hydrocephalus. Mild chronic microvascular ischemic change in the white matter and basal ganglia. Negative for acute infarct.  Negative for hemorrhage or mass. Vascular: Negative for hyperdense vessel Skull: Negative Sinuses/Orbits: Mucous retention cyst right maxillary sinus. Air-fluid level in the sphenoid sinus. Other: None IMPRESSION: Atrophy and chronic microvascular ischemic change. No acute intracranial abnormality Air-fluid level sphenoid sinus. Electronically Signed   By: Marlan Palau M.D.   On: 12/17/2016 12:37   Ct Abdomen Pelvis W Contrast  Result Date: 12/17/2016 CLINICAL DATA:  Upper quadrant pain. EXAM: CT ABDOMEN AND PELVIS WITH CONTRAST TECHNIQUE: Multidetector CT imaging of the abdomen and pelvis was performed using the standard protocol following bolus administration of intravenous contrast. CONTRAST:  75mL ISOVUE-300 IOPAMIDOL  (ISOVUE-300) INJECTION 61% COMPARISON:  None available FINDINGS: Lower chest: Trace bilateral effusions greater on the RIGHT. Hepatobiliary: Several low-density lesions in liver consistent benign hepatic cyst. No biliary duct dilatation. Gallbladder is distended to 4 cm. There is pericholecystic inflammation in the fat adjacent to the gallbladder. There is a calculus in the neck of the gallbladder measuring 10 mm (image 25, series 3). Small amount pericholecystic fluid. The distal common bile duct is upper limits normal at 6 mm. No common bile duct stone identified by CT. Pancreas: No pancreatic inflammation or duct dilatation. Spleen: Normal spleen Adrenals/urinary tract: Adrenal glands and kidneys are normal. The ureters and bladder normal. Stomach/Bowel: Stomach and small bowel are normal. Appendix not identified. Surgical clips in the cecal region. Colon rectosigmoid colon normal. Vascular/Lymphatic: Abdominal aorta is normal caliber with atherosclerotic calcification. There is no retroperitoneal or periportal lymphadenopathy. No pelvic lymphadenopathy. Reproductive: Prostate normal Other: No free fluid. Musculoskeletal: No aggressive osseous lesion. IMPRESSION: 1. Findings consistent with acute cholecystitis. 2. Common bile duct upper limits of normal. 3.  Aortic Atherosclerosis (ICD10-I70.0). Electronically Signed   By: Genevive Bi M.D.   On: 12/17/2016 14:35    Procedures Procedures (including critical care time) Procedure note: Ultrasound Guided Peripheral IV Ultrasound guided peripheral 1.88 inch angiocath IV placement performed by me. Indications: Nursing unable to place IV. Details: The antecubital fossa and upper arm were evaluated with a multifrequency linear probe. Patent brachial veins were noted. 1 attempt was made to cannulate a vein under realtime US guidance with successful cannulation of the vein and catheter placement. There is return of non-pulsatile dark red blood. The patient  tolerated the procedure well without complications. Images archived electronically.  CPT codes: 11914 and [phone]  Medications Ordered in ED Medications  piperacillin-tazobactam (ZOSYN) IVPB 3.375 g (3.375 g Intravenous New Bag/Given 12/17/16 1441)  vancomycin (VANCOCIN) 1,500 mg in sodium chloride 0.9 % 500 mL IVPB (not administered)  iopamidol (ISOVUE-300) 61 % injection (not administered)  iopamidol (ISOVUE-300) 61 % injection 75 mL (75 mLs Intravenous Contrast Given 12/17/16 1406)     Initial Impression / Assessment and Plan / ED Course  I have reviewed the triage vital signs and the nursing notes.  Pertinent labs & imaging results that were available during my care of the patient were reviewed by me and considered in my medical decision making (see chart for  details).     83 yo M with fever.  Will obtain cxr, ua, labs, reassess.    Further history from the family who arrives. Stated that the patient a couple days ago had severe right upper quadrant pain and went to the [facility] emergency department. Had an ultrasound done there that showed sludge but no acute cholecystitis. At that point he was discharged home. He has right upper quadrant tenderness on my repeat exam. Will obtain a CT of the abdomen and pelvis.   CT With acute cholecystitis. Discussed with general surgery, Will Marlyne Beards, PA,  He'll come and see the patient. Recommended medical admission.  The patients results and plan were reviewed and discussed.   Any x-rays performed were independently reviewed by myself.   Differential diagnosis were considered with the presenting HPI.  Medications  piperacillin-tazobactam (ZOSYN) IVPB 3.375 g (3.375 g Intravenous New Bag/Given 12/17/16 1441)  vancomycin (VANCOCIN) 1,500 mg in sodium chloride 0.9 % 500 mL IVPB (not administered)  iopamidol (ISOVUE-300) 61 % injection (not administered)  iopamidol (ISOVUE-300) 61 % injection 75 mL (75 mLs Intravenous Contrast Given  12/17/16 1406)    Vitals:   12/17/16 1147 12/17/16 1437 12/17/16 1449  BP: (!) 135/48  (!) 139/54  Pulse: 71  68  Resp: 17  19  Temp: 97.7 F (36.5 C)    TempSrc: Oral    SpO2: 93%  94%  Weight:  81.6 kg (180 lb)   Height:  5\' 4"  (1.626 m)     Final diagnoses:  Cholecystitis  Altered mental status, unspecified altered mental status type    Admission/ observation were discussed with the admitting physician, patient and/or family and they are comfortable with the plan.      Final Clinical Impressions(s) / ED Diagnoses   Final diagnoses:  Cholecystitis  Altered mental status, unspecified altered mental status type    New Prescriptions New Prescriptions   No medications on file     Melene Plan, DO 12/17/16 1504

## 2016-12-18 DIAGNOSIS — R4182 Altered mental status, unspecified: Secondary | ICD-10-CM

## 2016-12-18 DIAGNOSIS — K819 Cholecystitis, unspecified: Secondary | ICD-10-CM

## 2016-12-18 LAB — MRSA PCR SCREENING: MRSA by PCR: NEGATIVE

## 2016-12-18 LAB — COMPREHENSIVE METABOLIC PANEL
ALT: 13 U/L — AB (ref 17–63)
AST: 21 U/L (ref 15–41)
Albumin: 2.8 g/dL — ABNORMAL LOW (ref 3.5–5.0)
Alkaline Phosphatase: 76 U/L (ref 38–126)
Anion gap: 6 (ref 5–15)
BILIRUBIN TOTAL: 1.3 mg/dL — AB (ref 0.3–1.2)
BUN: 22 mg/dL — ABNORMAL HIGH (ref 6–20)
CHLORIDE: 110 mmol/L (ref 101–111)
CO2: 24 mmol/L (ref 22–32)
CREATININE: 1.19 mg/dL (ref 0.61–1.24)
Calcium: 7.7 mg/dL — ABNORMAL LOW (ref 8.9–10.3)
GFR, EST NON AFRICAN AMERICAN: 55 mL/min — AB (ref >60)
Glucose, Bld: 124 mg/dL — ABNORMAL HIGH (ref 65–99)
POTASSIUM: 4.3 mmol/L (ref 3.5–5.1)
Sodium: 140 mmol/L (ref 135–145)
TOTAL PROTEIN: 5.7 g/dL — AB (ref 6.5–8.1)

## 2016-12-18 LAB — CBC
HEMATOCRIT: 37.3 % — AB (ref 39.0–52.0)
Hemoglobin: 12 g/dL — ABNORMAL LOW (ref 13.0–17.0)
MCH: 30.2 pg (ref 26.0–34.0)
MCHC: 32.2 g/dL (ref 30.0–36.0)
MCV: 93.7 fL (ref 78.0–100.0)
Platelets: 139 10*3/uL — ABNORMAL LOW (ref 150–400)
RBC: 3.98 MIL/uL — AB (ref 4.22–5.81)
RDW: 13.4 % (ref 11.5–15.5)
WBC: 11.2 10*3/uL — ABNORMAL HIGH (ref 4.0–10.5)

## 2016-12-18 LAB — CREATININE, URINE, RANDOM: CREATININE, URINE: 250.92 mg/dL

## 2016-12-18 LAB — MAGNESIUM: Magnesium: 2.1 mg/dL (ref 1.7–2.4)

## 2016-12-18 LAB — LACTIC ACID, PLASMA
Lactic Acid, Venous: 1.3 mmol/L (ref 0.5–1.9)
Lactic Acid, Venous: 1.9 mmol/L (ref 0.5–1.9)

## 2016-12-18 LAB — PHOSPHORUS: Phosphorus: 2.5 mg/dL (ref 2.5–4.6)

## 2016-12-18 LAB — TSH: TSH: 1.394 u[IU]/mL (ref 0.350–4.500)

## 2016-12-18 LAB — URINE CULTURE: CULTURE: NO GROWTH

## 2016-12-18 LAB — SODIUM, URINE, RANDOM: Sodium, Ur: 12 mmol/L

## 2016-12-18 LAB — LIPASE, BLOOD: Lipase: 32 U/L (ref 11–51)

## 2016-12-18 MED ORDER — SODIUM CHLORIDE 0.9 % IV SOLN
INTRAVENOUS | Status: AC
  Administered 2016-12-18: 20:00:00 via INTRAVENOUS

## 2016-12-18 MED ORDER — SODIUM CHLORIDE 0.9 % IV BOLUS (SEPSIS)
1000.0000 mL | Freq: Once | INTRAVENOUS | Status: AC
  Administered 2016-12-18: 1000 mL via INTRAVENOUS

## 2016-12-18 MED ORDER — LORAZEPAM 2 MG/ML IJ SOLN
0.5000 mg | Freq: Four times a day (QID) | INTRAMUSCULAR | Status: DC | PRN
  Administered 2016-12-22: 0.5 mg via INTRAVENOUS
  Filled 2016-12-18: qty 1

## 2016-12-18 NOTE — Progress Notes (Signed)
PROGRESS NOTE    Travis Wallace  ZOX:[id number] DOB: 1930-10-08 DOA: 12/17/2016 PCP: Doreen Salvage, PA-C   Brief Narrative:  Travis Wallace is a 83 y.o. male with medical history significant of CAD,  HTN, HLD, dementia and other comorbids who presented with 1-2 day history of worsening confusion and lethargy. He has had fevers but unable to provide detailed history. He was evaluated 5 days ago for Chest Pain and was found to have gallbladder sludge but no active cholecystitis at that time in [facility]. He presented to [facility] for worsening confusion and lethargy was worked up and found to be septic 2/2 to Acute Cholecystitis. General Surgery Consulted and are planning on taking the patient for Surgery later today or tomorrow.   Assessment & Plan:   Active Problems:   Acute cholecystitis   Cholangitis   Severe sepsis (HCC)   CAD (coronary artery disease)   HTN (hypertension)   OSA (obstructive sleep apnea)   Sepsis (HCC)   Dementia  Severe sepsis (HCC) from Acute Cholecystitis  -Admitted per Sepsis protocol likely source being  intra-abdominal infection -Rehydrated with 6ml/kg -Initiated Broad spectrum antibiotics with IV Zosyn -Obtained Blood cultures -Lactic Acid level went from 2.23 -> 1.9 -Obtain procalcitonin level -Admit and monitor vital signs closely -PCCM has been made aware -CT Abdomen and Pelvis shows Gallbladder is distended to 4 cm. There is pericholecystic inflammation in the fat adjacent to the gallbladder. There is a calculus in the neck of the gallbladder measuring 10 mm. Small amount pericholecystic fluid. The distal common bile duct is upper limits normal at 6 mm. No common bile duct stone identified by CT. Findings were consistent with acute cholecystitis.  -Pain Control with 0.5 mg IV Dilaudid q4hprn for Severe Pain and Zofran po/IV for Nausea/Vomiting -C/w IVF Rehydration with NS at 100 mL/hr x 10 hours and then 75 mL/hr x 10 hours -General Surgery Consulted  for further evaluation and Management -General Surgery to take the patient for cholecystectomy later today or Friday December 19, 2016  Acute cholecystitis  -Appreciated surgical consult,  -Will treat with IV antibiotics and continue Zosyn,  -Await results of blood cultures, k -Keep nothing by mouth and rehydrate.  -Defer to surgery  regarding timing of cholecystectomy  Lactic Acidosis 2/2 to Sepsis -Improved.  -Lactic Acid level went from 2.23 -> 1.9 -C/w IVF Rehydration with NS at 100 mL/hr x 10 hours and then 75 mL/hr x 10 hours  CAD (coronary artery disease)  -Stable,  -Hold home medications while NPO,  -per wife has been asymptomatic since original CABG years ago,   HTN (hypertension)  -Hold home medications while septic  -Allow permissive hypertension  -Rehydrate  OSA (obstructive sleep apnea) -Patient does not tolerate CPAP  Dementia  -Expect some degree of sundowning,  -Patient altered at baseline but due to infection have been more significantly confused.  DVT prophylaxis: SCDs Code Status: FULL CODE Family Communication: No Family present at bedside Disposition Plan: Remain inpatient in SDU as patient is ill-appearing and will go for Cholecystectomy later today or tomorrow.   Consultants:   General Surgery Dr. Ezzard Standing   Procedures:    Antimicrobials:  Anti-infectives    Start     Dose/Rate Route Frequency Ordered Stop   12/17/16 2100  piperacillin-tazobactam (ZOSYN) IVPB 3.375 g     3.375 g 12.5 mL/hr over 240 Minutes Intravenous Every 8 hours 12/17/16 1640     12/17/16 1345  piperacillin-tazobactam (ZOSYN) IVPB 3.375 g  3.375 g 100 mL/hr over 30 Minutes Intravenous  Once 12/17/16 1338 12/17/16 1533   12/17/16 1345  vancomycin (VANCOCIN) 1,500 mg in sodium chloride 0.9 % 500 mL IVPB     1,500 mg 250 mL/hr over 120 Minutes Intravenous  Once 12/17/16 1338 12/17/16 1833     Subjective: Seen and examined and was still confused. Had some abdominal pain  and was incontinent. No nausea or vomiting per patient.   Objective: Vitals:   12/18/16 0331 12/18/16 0400 12/18/16 0500 12/18/16 0600  BP:  (!) 127/36 (!) 121/49 (!) 119/46  Pulse:  68 (!) 36 (!) 42  Resp:  (!) 21 20 (!) 24  Temp: 98.2 F (36.8 C)     TempSrc: Axillary     SpO2:  94% 96% 95%  Weight:      Height:        Intake/Output Summary (Last 24 hours) at 12/18/16 0728 Last data filed at 12/18/16 0600  Gross per 24 hour  Intake          1303.33 ml  Output                0 ml  Net          1303.33 ml   Filed Weights   12/17/16 1437 12/17/16 2235  Weight: 81.6 kg (180 lb) 80.2 kg (176 lb 12.9 oz)   Examination: Physical Exam:  Constitutional:  NAD and appears calm and comfortable laying in bed Eyes: Lids and conjunctivae normal, sclerae anicteric  ENMT: External Ears, Nose appear normal. Grossly normal hearing.  Neck: Appears normal, supple, no cervical masses, normal ROM, no appreciable thyromegaly, no JVD Respiratory: Diminished to auscultation bilaterally, no wheezing, rales, rhonchi or crackles. Normal respiratory effort and patient is not tachypenic. No accessory muscle use.  Cardiovascular: RRR, no murmurs / rubs / gallops. S1 and S2 auscultated. No extremity edema.  Abdomen: Soft, tender to palpate especially RUQ, non-distended. No masses palpated. No appreciable hepatosplenomegaly. Bowel sounds positive.  GU: Deferred. Musculoskeletal: No clubbing / cyanosis of digits/nails. No joint deformity upper and lower extremities.  Skin: No rashes, lesions, ulcers on a limited skin eval. No induration; Warm and dry.  Neurologic: CN 2-12 grossly intact with no focal deficits. Romberg sign cerebellar reflexes not assessed.  Psychiatric: Impaired judgment and insight. Alert but not oriented x 3.   Data Reviewed: I have personally reviewed following labs and imaging studies  CBC:  Recent Labs Lab 12/17/16 1154 12/18/16 0327  WBC 12.9* 11.2*  NEUTROABS 11.7*  --     HGB 14.7 12.0*  HCT 44.8 37.3*  MCV 93.1 93.7  PLT 159 139*   Basic Metabolic Panel:  Recent Labs Lab 12/17/16 1154 12/17/16 1700 12/18/16 0327  NA 137 139 140  K 4.8 4.6 4.3  CL 104 106 110  CO2 24 24 24   GLUCOSE 143* 141* 124*  BUN 26* 26* 22*  CREATININE 1.42* 1.45* 1.19  CALCIUM 8.7* 8.0* 7.7*  MG  --   --  2.1  PHOS  --   --  2.5   GFR: Estimated Creatinine Clearance: 45 mL/min (by C-G formula based on SCr of 1.19 mg/dL). Liver Function Tests:  Recent Labs Lab 12/17/16 1154 12/17/16 1700 12/18/16 0327  AST 26 24 21   ALT 16* 14* 13*  ALKPHOS 105 86 76  BILITOT 1.9* 1.4* 1.3*  PROT 7.2 6.4* 5.7*  ALBUMIN 3.7 3.1* 2.8*    Recent Labs Lab 12/17/16 1154 12/18/16 0327  LIPASE 32  32   No results for input(s): AMMONIA in the last 168 hours. Coagulation Profile:  Recent Labs Lab 12/17/16 2223  INR 1.17   Cardiac Enzymes: No results for input(s): CKTOTAL, CKMB, CKMBINDEX, TROPONINI in the last 168 hours. BNP (last 3 results) No results for input(s): PROBNP in the last 8760 hours. HbA1C: No results for input(s): HGBA1C in the last 72 hours. CBG: No results for input(s): GLUCAP in the last 168 hours. Lipid Profile: No results for input(s): CHOL, HDL, LDLCALC, TRIG, CHOLHDL, LDLDIRECT in the last 72 hours. Thyroid Function Tests:  Recent Labs  12/18/16 0327  TSH 1.394   Anemia Panel: No results for input(s): VITAMINB12, FOLATE, FERRITIN, TIBC, IRON, RETICCTPCT in the last 72 hours. Sepsis Labs:  Recent Labs Lab 12/17/16 1205 12/17/16 1724 12/17/16 2043 12/17/16 2210 12/18/16 0015  PROCALCITON  --   --  2.39  --   --   LATICACIDVEN 2.25* 2.23*  --  1.6 1.9    Recent Results (from the past 240 hour(s))  MRSA PCR Screening     Status: None   Collection Time: 12/17/16 10:36 PM  Result Value Ref Range Status   MRSA by PCR NEGATIVE NEGATIVE Final    Comment:        The GeneXpert MRSA Assay (FDA approved for NASAL specimens only), is  one component of a comprehensive MRSA colonization surveillance program. It is not intended to diagnose MRSA infection nor to guide or monitor treatment for MRSA infections.     Radiology Studies: Dg Chest 2 View  Result Date: 12/17/2016 CLINICAL DATA:  Sepsis.  Lethargic. EXAM: CHEST  2 VIEW COMPARISON:  None FINDINGS: Two views of the chest demonstrate low lung volumes. No focal airspace disease or frank pulmonary edema. Prior median sternotomy. Surgical hardware in the lumbar spine. Aortic atherosclerosis. Heart size is within normal limits. IMPRESSION: Low lung volumes.  No acute findings. Electronically Signed   By: Richarda Overlie M.D.   On: 12/17/2016 13:17   Ct Head Wo Contrast  Result Date: 12/17/2016 CLINICAL DATA:  Sepsis EXAM: CT HEAD WITHOUT CONTRAST TECHNIQUE: Contiguous axial images were obtained from the base of the skull through the vertex without intravenous contrast. COMPARISON:  None. FINDINGS: Brain: Moderate atrophy. Negative for hydrocephalus. Mild chronic microvascular ischemic change in the white matter and basal ganglia. Negative for acute infarct.  Negative for hemorrhage or mass. Vascular: Negative for hyperdense vessel Skull: Negative Sinuses/Orbits: Mucous retention cyst right maxillary sinus. Air-fluid level in the sphenoid sinus. Other: None IMPRESSION: Atrophy and chronic microvascular ischemic change. No acute intracranial abnormality Air-fluid level sphenoid sinus. Electronically Signed   By: Marlan Palau M.D.   On: 12/17/2016 12:37   Ct Abdomen Pelvis W Contrast  Result Date: 12/17/2016 CLINICAL DATA:  Upper quadrant pain. EXAM: CT ABDOMEN AND PELVIS WITH CONTRAST TECHNIQUE: Multidetector CT imaging of the abdomen and pelvis was performed using the standard protocol following bolus administration of intravenous contrast. CONTRAST:  75mL ISOVUE-300 IOPAMIDOL (ISOVUE-300) INJECTION 61% COMPARISON:  None available FINDINGS: Lower chest: Trace bilateral effusions  greater on the RIGHT. Hepatobiliary: Several low-density lesions in liver consistent benign hepatic cyst. No biliary duct dilatation. Gallbladder is distended to 4 cm. There is pericholecystic inflammation in the fat adjacent to the gallbladder. There is a calculus in the neck of the gallbladder measuring 10 mm (image 25, series 3). Small amount pericholecystic fluid. The distal common bile duct is upper limits normal at 6 mm. No common bile duct stone identified by CT.  Pancreas: No pancreatic inflammation or duct dilatation. Spleen: Normal spleen Adrenals/urinary tract: Adrenal glands and kidneys are normal. The ureters and bladder normal. Stomach/Bowel: Stomach and small bowel are normal. Appendix not identified. Surgical clips in the cecal region. Colon rectosigmoid colon normal. Vascular/Lymphatic: Abdominal aorta is normal caliber with atherosclerotic calcification. There is no retroperitoneal or periportal lymphadenopathy. No pelvic lymphadenopathy. Reproductive: Prostate normal Other: No free fluid. Musculoskeletal: No aggressive osseous lesion. IMPRESSION: 1. Findings consistent with acute cholecystitis. 2. Common bile duct upper limits of normal. 3.  Aortic Atherosclerosis (ICD10-I70.0). Electronically Signed   By: Genevive Bi M.D.   On: 12/17/2016 14:35   Scheduled Meds: . chlorhexidine  15 mL Mouth Rinse BID  . mouth rinse  15 mL Mouth Rinse q12n4p  . sodium chloride flush  3 mL Intravenous Q12H   Continuous Infusions: . sodium chloride 100 mL/hr at 12/18/16 0600  . piperacillin-tazobactam (ZOSYN)  IV 3.375 g (12/18/16 0512)    LOS: 1 day   Merlene Laughter, DO [facility] Pager [phone]  If 7PM-7AM, please contact night-coverage [email or web address] Password [facility] 12/18/2016, 7:28 AM

## 2016-12-18 NOTE — Progress Notes (Signed)
LOS: 1 day   Subjective: Per RN patient has complained of abdominal pain on and off, no pain currently. Patient sleeping in bed. Patient making urine but is incontinent.    Objective: Vital signs in last 24 hours: Temp:  [97.7 F (36.5 C)-99.3 F (37.4 C)] 98.2 F (36.8 C) (06/21 0331) Pulse Rate:  [36-85] 42 (06/21 0600) Resp:  [12-25] 24 (06/21 0600) BP: (102-139)/(36-110) 119/46 (06/21 0600) SpO2:  [91 %-96 %] 95 % (06/21 0600) Weight:  [80.2 kg (176 lb 12.9 oz)-81.6 kg (180 lb)] 80.2 kg (176 lb 12.9 oz) (06/20 2235)    Laboratory CBC  Recent Labs  12/17/16 1154 12/18/16 0327  WBC 12.9* 11.2*  HGB 14.7 12.0*  HCT 44.8 37.3*  PLT 159 139*   BMET  Recent Labs  12/17/16 1700 12/18/16 0327  NA 139 140  K 4.6 4.3  CL 106 110  CO2 24 24  GLUCOSE 141* 124*  BUN 26* 22*  CREATININE 1.45* 1.19  CALCIUM 8.0* 7.7*   Lactic Acid, Venous    Component Value Date/Time   LATICACIDVEN 1.9 12/18/2016 0015    Radiology EXAM: CT ABDOMEN AND PELVIS WITH CONTRAST  TECHNIQUE: Multidetector CT imaging of the abdomen and pelvis was performed using the standard protocol following bolus administration of intravenous contrast.  CONTRAST:  75mL ISOVUE-300 IOPAMIDOL (ISOVUE-300) INJECTION 61%  COMPARISON:  None available  FINDINGS: Lower chest: Trace bilateral effusions greater on the RIGHT.  Hepatobiliary: Several low-density lesions in liver consistent benign hepatic cyst. No biliary duct dilatation.  Gallbladder is distended to 4 cm. There is pericholecystic inflammation in the fat adjacent to the gallbladder. There is a calculus in the neck of the gallbladder measuring 10 mm (image 25, series 3). Small amount pericholecystic fluid.  The distal common bile duct is upper limits normal at 6 mm. No common bile duct stone identified by CT.  Pancreas: No pancreatic inflammation or duct dilatation.  Spleen: Normal spleen  Adrenals/urinary tract:  Adrenal glands and kidneys are normal. The ureters and bladder normal.  Stomach/Bowel: Stomach and small bowel are normal. Appendix not identified. Surgical clips in the cecal region. Colon rectosigmoid colon normal.  Vascular/Lymphatic: Abdominal aorta is normal caliber with atherosclerotic calcification. There is no retroperitoneal or periportal lymphadenopathy. No pelvic lymphadenopathy.  Reproductive: Prostate normal  Other: No free fluid.  Musculoskeletal: No aggressive osseous lesion.  IMPRESSION: 1. Findings consistent with acute cholecystitis. 2. Common bile duct upper limits of normal. 3.  Aortic Atherosclerosis (ICD10-I70.0).   Electronically Signed   By: Genevive Bi M.D.   On: 12/17/2016 14:35  Physical Exam General: Sleeping, easily roused. NAD Cardio: regular rate and rhythm. No M/G/R appreciated. Pulm: normal effort, CTAB GI: soft, generalized TTP, distended and tympanic. Bowel sounds present. Skin: Warm, dry, intact   ID Results for orders placed or performed during the hospital encounter of 12/17/16  MRSA PCR Screening     Status: None   Collection Time: 12/17/16 10:36 PM  Result Value Ref Range Status   MRSA by PCR NEGATIVE NEGATIVE Final    Comment:        The GeneXpert MRSA Assay (FDA approved for NASAL specimens only), is one component of a comprehensive MRSA colonization surveillance program. It is not intended to diagnose MRSA infection nor to guide or monitor treatment for MRSA infections.     Assessment/Plan: Acute Cholecystitis - WBC 11.2 - continue IV Zosyn - keep patient NPO - plan for laparoscopic cholecystectomy late today or early tomorrow -  consent patient  Sepsis  FEN - NPO, IVF.  VTE - SCD ID - IV Zosyn (6/20>>)  Plan: to OR for cholecystectomy either today or tomorrow  Wells GuilesKelly Rayburn , [facility] 12/18/2016, 7:37 AM Pager: [phone] Consults:[phone]  Surgery delayed till  tomorrow because I ran out of time in the OR.  Ovidio Kinavid Jaspal Pultz, MD, [facility] Pager: [phone] Office phone:  [phone]

## 2016-12-18 NOTE — Care Management Note (Signed)
Case Management Note  Patient Details  Name: Travis Wallace MRN: [id number] Date of Birth: 07/28/1930  Subjective/Objective:                  83 y.o. male with medical history significant of CAD,  HTN, HLD, dementia  Presented with 1-2 day history of worsening confusion and lethargy fevers he has not been abdomen endorsing chest pain or abdominal pain but unable to provide detailed history. Family 2 days ago she was noted to be more lethargic and [facility] that he may have some sludge in his gallbladder but at that time showed no acute cholecystitis he was at that point complaining of some epigastric/chest pain and evaluated for that small from a standpoint of ACS rule out. He continued to she'll poorly and developed a fever he was too weak to stand up   Action/Plan: Date:  December 18, 2016 Chart reviewed for concurrent status and case management needs. Will continue to follow patient progress. Discharge Planning: following for needs Expected discharge date: [id number] Marcelle SmilingRhonda Maston Wight, BSN, [place]ardRN3, [place]CCM   [phone]  Expected Discharge Date:   (UNKNOWN)               Expected Discharge Plan:  Assisted Living / Rest Home  In-House Referral:  Clinical Social Work  Discharge planning Services  CM Consult  Post Acute Care Choice:    Choice offered to:     DME Arranged:    DME Agency:     HH Arranged:    HH Agency:     Status of Service:  In process, will continue to follow  If discussed at Long Length of Stay Meetings, dates discussed:    Additional Comments:  Golda AcreDavis, Tierre Netto Lynn, RN 12/18/2016, 9:38 AM

## 2016-12-19 ENCOUNTER — Encounter (HOSPITAL_COMMUNITY)
Admission: EM | Disposition: A | Discharge status: Home / Self Care | Payer: Self-pay | Source: Home / Self Care | Attending: Internal Medicine

## 2016-12-19 ENCOUNTER — Inpatient Hospital Stay (HOSPITAL_COMMUNITY): Payer: Medicare Other

## 2016-12-19 ENCOUNTER — Encounter (HOSPITAL_COMMUNITY): Payer: Self-pay | Admitting: Certified Registered"

## 2016-12-19 ENCOUNTER — Inpatient Hospital Stay (HOSPITAL_COMMUNITY): Payer: Medicare Other | Admitting: Certified Registered"

## 2016-12-19 DIAGNOSIS — I2510 Atherosclerotic heart disease of native coronary artery without angina pectoris: Secondary | ICD-10-CM

## 2016-12-19 DIAGNOSIS — K829 Disease of gallbladder, unspecified: Secondary | ICD-10-CM

## 2016-12-19 DIAGNOSIS — N179 Acute kidney failure, unspecified: Secondary | ICD-10-CM

## 2016-12-19 DIAGNOSIS — K810 Acute cholecystitis: Secondary | ICD-10-CM

## 2016-12-19 DIAGNOSIS — R001 Bradycardia, unspecified: Secondary | ICD-10-CM

## 2016-12-19 DIAGNOSIS — F0280 Dementia in other diseases classified elsewhere without behavioral disturbance: Secondary | ICD-10-CM

## 2016-12-19 DIAGNOSIS — K819 Cholecystitis, unspecified: Secondary | ICD-10-CM

## 2016-12-19 HISTORY — PX: CHOLECYSTECTOMY: SHX55

## 2016-12-19 HISTORY — DX: Acute cholecystitis: K81.0

## 2016-12-19 LAB — CBC WITH DIFFERENTIAL/PLATELET
BASOS ABS: 0 10*3/uL (ref 0.0–0.1)
BASOS PCT: 0 %
Eosinophils Absolute: 0 10*3/uL (ref 0.0–0.7)
Eosinophils Relative: 0 %
HEMATOCRIT: 37.4 % — AB (ref 39.0–52.0)
Hemoglobin: 12 g/dL — ABNORMAL LOW (ref 13.0–17.0)
Lymphocytes Relative: 6 %
Lymphs Abs: 0.6 10*3/uL — ABNORMAL LOW (ref 0.7–4.0)
MCH: 30 pg (ref 26.0–34.0)
MCHC: 32.1 g/dL (ref 30.0–36.0)
MCV: 93.5 fL (ref 78.0–100.0)
MONO ABS: 0.8 10*3/uL (ref 0.1–1.0)
Monocytes Relative: 8 %
NEUTROS ABS: 8.7 10*3/uL — AB (ref 1.7–7.7)
NEUTROS PCT: 86 %
Platelets: 142 10*3/uL — ABNORMAL LOW (ref 150–400)
RBC: 4 MIL/uL — AB (ref 4.22–5.81)
RDW: 13.2 % (ref 11.5–15.5)
WBC: 10.1 10*3/uL (ref 4.0–10.5)

## 2016-12-19 LAB — PHOSPHORUS: Phosphorus: 3 mg/dL (ref 2.5–4.6)

## 2016-12-19 LAB — COMPREHENSIVE METABOLIC PANEL
ALBUMIN: 2.4 g/dL — AB (ref 3.5–5.0)
ALT: 14 U/L — AB (ref 17–63)
AST: 26 U/L (ref 15–41)
Alkaline Phosphatase: 67 U/L (ref 38–126)
Anion gap: 5 (ref 5–15)
BILIRUBIN TOTAL: 1.5 mg/dL — AB (ref 0.3–1.2)
BUN: 26 mg/dL — AB (ref 6–20)
CHLORIDE: 115 mmol/L — AB (ref 101–111)
CO2: 22 mmol/L (ref 22–32)
Calcium: 7.4 mg/dL — ABNORMAL LOW (ref 8.9–10.3)
Creatinine, Ser: 1.22 mg/dL (ref 0.61–1.24)
GFR calc Af Amer: >60 mL/min (ref >60)
GFR calc non Af Amer: 53 mL/min — ABNORMAL LOW (ref >60)
GLUCOSE: 110 mg/dL — AB (ref 65–99)
POTASSIUM: 4.1 mmol/L (ref 3.5–5.1)
Sodium: 142 mmol/L (ref 135–145)
TOTAL PROTEIN: 5.1 g/dL — AB (ref 6.5–8.1)

## 2016-12-19 LAB — MAGNESIUM: Magnesium: 2.3 mg/dL (ref 1.7–2.4)

## 2016-12-19 LAB — LACTIC ACID, PLASMA: Lactic Acid, Venous: 1.1 mmol/L (ref 0.5–1.9)

## 2016-12-19 SURGERY — LAPAROSCOPIC CHOLECYSTECTOMY WITH INTRAOPERATIVE CHOLANGIOGRAM
Anesthesia: General | Site: Abdomen

## 2016-12-19 MED ORDER — ROCURONIUM BROMIDE 10 MG/ML (PF) SYRINGE
PREFILLED_SYRINGE | INTRAVENOUS | Status: DC | PRN
Start: 2016-12-19 — End: 2016-12-19
  Administered 2016-12-19: 50 mg via INTRAVENOUS
  Administered 2016-12-19: 10 mg via INTRAVENOUS
  Administered 2016-12-19: 5 mg via INTRAVENOUS

## 2016-12-19 MED ORDER — LACTATED RINGERS IR SOLN
Status: DC | PRN
  Administered 2016-12-19: 3000 mL

## 2016-12-19 MED ORDER — LIDOCAINE 2% (20 MG/ML) 5 ML SYRINGE
INTRAMUSCULAR | Status: AC
  Filled 2016-12-19: qty 5

## 2016-12-19 MED ORDER — SODIUM CHLORIDE 0.9 % IV SOLN
INTRAVENOUS | Status: DC | PRN
  Administered 2016-12-19: 11:00:00 via INTRAVENOUS

## 2016-12-19 MED ORDER — ATROPINE SULFATE 1 MG/10ML IJ SOSY
1.0000 mg | PREFILLED_SYRINGE | Freq: Once | INTRAMUSCULAR | Status: AC
  Administered 2016-12-19: 0.2 mg via INTRAVENOUS
  Filled 2016-12-19: qty 10

## 2016-12-19 MED ORDER — TRAMADOL HCL 50 MG PO TABS: 100.0000 mg | ORAL_TABLET | Freq: Two times a day (BID) | ORAL | Status: DC | PRN

## 2016-12-19 MED ORDER — ROCURONIUM BROMIDE 50 MG/5ML IV SOSY
PREFILLED_SYRINGE | INTRAVENOUS | Status: AC
  Filled 2016-12-19: qty 10

## 2016-12-19 MED ORDER — ETOMIDATE 2 MG/ML IV SOLN
INTRAVENOUS | Status: DC | PRN
  Administered 2016-12-19: 14 mg via INTRAVENOUS

## 2016-12-19 MED ORDER — FENTANYL CITRATE (PF) 100 MCG/2ML IJ SOLN: 25.0000 ug | INTRAMUSCULAR | Status: DC | PRN

## 2016-12-19 MED ORDER — SUCCINYLCHOLINE CHLORIDE 200 MG/10ML IV SOSY
PREFILLED_SYRINGE | INTRAVENOUS | Status: AC
  Filled 2016-12-19: qty 10

## 2016-12-19 MED ORDER — IOPAMIDOL (ISOVUE-300) INJECTION 61%
INTRAVENOUS | Status: AC
  Filled 2016-12-19: qty 50

## 2016-12-19 MED ORDER — ROCURONIUM BROMIDE 50 MG/5ML IV SOSY
PREFILLED_SYRINGE | INTRAVENOUS | Status: AC
  Filled 2016-12-19: qty 5

## 2016-12-19 MED ORDER — DOPAMINE-DEXTROSE 3.2-5 MG/ML-% IV SOLN
INTRAVENOUS | Status: AC
  Filled 2016-12-19: qty 250

## 2016-12-19 MED ORDER — FENTANYL CITRATE (PF) 250 MCG/5ML IJ SOLN
INTRAMUSCULAR | Status: AC
  Filled 2016-12-19: qty 5

## 2016-12-19 MED ORDER — SUGAMMADEX SODIUM 200 MG/2ML IV SOLN
INTRAVENOUS | Status: AC
  Filled 2016-12-19: qty 2

## 2016-12-19 MED ORDER — LIDOCAINE 2% (20 MG/ML) 5 ML SYRINGE
INTRAMUSCULAR | Status: DC | PRN
  Administered 2016-12-19: 80 mg via INTRAVENOUS

## 2016-12-19 MED ORDER — ONDANSETRON HCL 4 MG/2ML IJ SOLN
INTRAMUSCULAR | Status: AC
  Filled 2016-12-19: qty 2

## 2016-12-19 MED ORDER — ONDANSETRON HCL 4 MG/2ML IJ SOLN
INTRAMUSCULAR | Status: DC | PRN
  Administered 2016-12-19: 4 mg via INTRAVENOUS

## 2016-12-19 MED ORDER — PIPERACILLIN-TAZOBACTAM 3.375 G IVPB
INTRAVENOUS | Status: AC
  Filled 2016-12-19: qty 50

## 2016-12-19 MED ORDER — BUPIVACAINE-EPINEPHRINE 0.25% -1:200000 IJ SOLN
INTRAMUSCULAR | Status: DC | PRN
  Administered 2016-12-19: 30 mL

## 2016-12-19 MED ORDER — SUGAMMADEX SODIUM 200 MG/2ML IV SOLN
INTRAVENOUS | Status: DC | PRN
  Administered 2016-12-19: 175 mg via INTRAVENOUS

## 2016-12-19 MED ORDER — SODIUM CHLORIDE 0.9 % IV SOLN
INTRAVENOUS | Status: AC
  Administered 2016-12-19: 16:00:00 via INTRAVENOUS

## 2016-12-19 MED ORDER — PROMETHAZINE HCL 25 MG/ML IJ SOLN: 6.2500 mg | INTRAMUSCULAR | Status: DC | PRN

## 2016-12-19 MED ORDER — PROPOFOL 10 MG/ML IV BOLUS
INTRAVENOUS | Status: AC
  Filled 2016-12-19: qty 20

## 2016-12-19 MED ORDER — LACTATED RINGERS IV SOLN
INTRAVENOUS | Status: DC | PRN
  Administered 2016-12-19: 13:00:00 via INTRAVENOUS

## 2016-12-19 MED ORDER — SODIUM CHLORIDE 0.9 % IV SOLN
INTRAVENOUS | Status: DC | PRN
  Administered 2016-12-19: 5 mL

## 2016-12-19 MED ORDER — FENTANYL CITRATE (PF) 250 MCG/5ML IJ SOLN
INTRAMUSCULAR | Status: DC | PRN
  Administered 2016-12-19 (×3): 25 ug via INTRAVENOUS
  Administered 2016-12-19 (×2): 50 ug via INTRAVENOUS

## 2016-12-19 MED ORDER — BUPIVACAINE-EPINEPHRINE (PF) 0.25% -1:200000 IJ SOLN
INTRAMUSCULAR | Status: AC
  Filled 2016-12-19: qty 30

## 2016-12-19 MED ORDER — DEXAMETHASONE SODIUM PHOSPHATE 10 MG/ML IJ SOLN
INTRAMUSCULAR | Status: DC | PRN
  Administered 2016-12-19: 5 mg via INTRAVENOUS

## 2016-12-19 SURGICAL SUPPLY — 46 items
APPLIER CLIP 5 13 M/L LIGAMAX5 (MISCELLANEOUS)
APPLIER CLIP ROT 10 11.4 M/L (STAPLE)
BENZOIN TINCTURE PRP APPL 2/3 (GAUZE/BANDAGES/DRESSINGS) IMPLANT
BINDER ABDOMINAL 12 ML 46-62 (SOFTGOODS) ×2 IMPLANT
CABLE HIGH FREQUENCY MONO STRZ (ELECTRODE) ×2 IMPLANT
CHLORAPREP W/TINT 26ML (MISCELLANEOUS) ×2 IMPLANT
CHOLANGIOGRAM CATH TAUT (CATHETERS) ×2 IMPLANT
CLIP APPLIE 5 13 M/L LIGAMAX5 (MISCELLANEOUS) IMPLANT
CLIP APPLIE ROT 10 11.4 M/L (STAPLE) IMPLANT
COVER MAYO STAND STRL (DRAPES) ×2 IMPLANT
COVER SURGICAL LIGHT HANDLE (MISCELLANEOUS) ×2 IMPLANT
DECANTER SPIKE VIAL GLASS SM (MISCELLANEOUS) ×2 IMPLANT
DERMABOND ADVANCED (GAUZE/BANDAGES/DRESSINGS)
DERMABOND ADVANCED .7 DNX12 (GAUZE/BANDAGES/DRESSINGS) IMPLANT
DRAIN CHANNEL 19F RND (DRAIN) ×2 IMPLANT
DRAPE C-ARM 42X120 X-RAY (DRAPES) ×2 IMPLANT
DRAPE SHEET LG 3/4 BI-LAMINATE (DRAPES) ×2 IMPLANT
ELECT REM PT RETURN 15FT ADLT (MISCELLANEOUS) ×2 IMPLANT
EVACUATOR SILICONE 100CC (DRAIN) ×2 IMPLANT
FILTER SMOKE EVAC LAPAROSHD (FILTER) ×2 IMPLANT
GLOVE SURG SIGNA 7.5 PF LTX (GLOVE) ×2 IMPLANT
GOWN STRL REUS W/TWL XL LVL3 (GOWN DISPOSABLE) ×6 IMPLANT
HEMOSTAT SURGICEL 4X8 (HEMOSTASIS) ×2 IMPLANT
IRRIG SUCT STRYKERFLOW 2 WTIP (MISCELLANEOUS) ×2
IRRIGATION SUCT STRKRFLW 2 WTP (MISCELLANEOUS) ×1 IMPLANT
IV CATH 14GX2 1/4 (CATHETERS) ×2 IMPLANT
IV SET EXTENSION CATH 6 NF (IV SETS) ×2 IMPLANT
KIT BASIN OR (CUSTOM PROCEDURE TRAY) ×2 IMPLANT
POUCH RETRIEVAL ECOSAC 10 (ENDOMECHANICALS) ×1 IMPLANT
POUCH RETRIEVAL ECOSAC 10MM (ENDOMECHANICALS) ×1
POUCH SPECIMEN RETRIEVAL 10MM (ENDOMECHANICALS) ×2 IMPLANT
SCISSORS LAP 5X35 DISP (ENDOMECHANICALS) ×2 IMPLANT
SLEEVE ADV FIXATION 5X100MM (TROCAR) ×2 IMPLANT
SLEEVE SURGEON STRL (DRAPES) ×2 IMPLANT
STOPCOCK 4 WAY LG BORE MALE ST (IV SETS) ×2 IMPLANT
STRIP CLOSURE SKIN 1/4X4 (GAUZE/BANDAGES/DRESSINGS) IMPLANT
SUT ETHILON 2 0 PS N (SUTURE) ×2 IMPLANT
SUT MNCRL AB 4-0 PS2 18 (SUTURE) ×4 IMPLANT
SYR 10ML ECCENTRIC (SYRINGE) ×2 IMPLANT
TOWEL OR 17X26 10 PK STRL BLUE (TOWEL DISPOSABLE) ×2 IMPLANT
TOWEL OR NON WOVEN STRL DISP B (DISPOSABLE) ×2 IMPLANT
TRAY LAPAROSCOPIC (CUSTOM PROCEDURE TRAY) ×2 IMPLANT
TROCAR ADV FIXATION 11X100MM (TROCAR) ×4 IMPLANT
TROCAR ADV FIXATION 5X100MM (TROCAR) ×2 IMPLANT
TROCAR XCEL BLUNT TIP 100MML (ENDOMECHANICALS) ×2 IMPLANT
TUBING INSUF HEATED (TUBING) ×2 IMPLANT

## 2016-12-19 NOTE — Progress Notes (Signed)
CSW consulted to assist with d/c planning. Pt is presently out of room having surgery. CSW will return to assessed and assist with d/c planning needs.  Cori RazorJamie Konrad Hoak LCSW [phone]

## 2016-12-19 NOTE — Anesthesia Procedure Notes (Signed)
Procedure Name: Intubation Date/Time: 12/19/2016 12:18 PM Performed by: Minerva EndsMIRARCHI, Wister Hoefle M Pre-anesthesia Checklist: Patient identified, Emergency Drugs available, Suction available and Patient being monitored Patient Re-evaluated:Patient Re-evaluated prior to inductionOxygen Delivery Method: Circle System Utilized Preoxygenation: Pre-oxygenation with 100% oxygen Intubation Type: IV induction Ventilation: Mask ventilation without difficulty Laryngoscope Size: Miller and 2 Tube type: Oral Number of attempts: 1 Airway Equipment and Method: Stylet Placement Confirmation: ETT inserted through vocal cords under direct vision,  positive ETCO2 and breath sounds checked- equal and bilateral Secured at: 22 cm Tube secured with: Tape Dental Injury: Teeth and Oropharynx as per pre-operative assessment  Comments: Smooth IV induction Rose--- Roc--- intubation AM CRNA atraumatic--- teeth and mouth as preop --- bilat BS Rose

## 2016-12-19 NOTE — Anesthesia Preprocedure Evaluation (Addendum)
Anesthesia Evaluation  Patient identified by MRN, date of birth, ID band Patient confused    Reviewed: Allergy & Precautions, NPO status , Patient's Chart, lab work & pertinent test results, Unable to perform ROS - Chart review only  Airway Mallampati: II  TM Distance: >3 FB Neck ROM: Limited    Dental no notable dental hx. (+) Dental Advisory Given, Partial Upper   Pulmonary neg pulmonary ROS,    Pulmonary exam normal breath sounds clear to auscultation       Cardiovascular hypertension, + CAD and + CABG   Rhythm:Regular Rate:Bradycardia  Severe bradycardia into 30's  Cleared by cardiology   Neuro/Psych Dementia severe negative psych ROS   GI/Hepatic negative GI ROS, Neg liver ROS,   Endo/Other  negative endocrine ROS  Renal/GU negative Renal ROS  negative genitourinary   Musculoskeletal negative musculoskeletal ROS (+)   Abdominal   Peds negative pediatric ROS (+)  Hematology negative hematology ROS (+)   Anesthesia Other Findings   Reproductive/Obstetrics negative OB ROS                            Anesthesia Physical Anesthesia Plan  ASA: IV  Anesthesia Plan: General   Post-op Pain Management:    Induction: Intravenous  PONV Risk Score and Plan: 1 and Ondansetron, Dexamethasone and Treatment may vary due to age or medical condition  Airway Management Planned: Oral ETT  Additional Equipment:   Intra-op Plan:   Post-operative Plan: Extubation in OR  Informed Consent: I have reviewed the patients History and Physical, chart, labs and discussed the procedure including the risks, benefits and alternatives for the proposed anesthesia with the patient or authorized representative who has indicated his/her understanding and acceptance.   Dental advisory given  Plan Discussed with: CRNA and Surgeon  Anesthesia Plan Comments:         Anesthesia Quick Evaluation

## 2016-12-19 NOTE — Progress Notes (Addendum)
[facility] Progress Note     Subjective: CC: abdominal pain Patient with pain when anyone touches abdomen, no n/v. Per RN not making much urine Bradycardic overnight, DBP very soft  Objective: Vital signs in last 24 hours: Temp:  [97.4 F (36.3 C)-101.9 F (38.8 C)] 98.4 F (36.9 C) (06/22 0322) Pulse Rate:  [31-88] 32 (06/22 0700) Resp:  [12-29] 14 (06/22 0700) BP: (75-160)/(23-46) 138/40 (06/22 0700) SpO2:  [88 %-100 %] 96 % (06/22 0700)    Intake/Output from previous day: 06/21 0701 - 06/22 0700 In: 1270.5 [I.V.:680.5; IV Piggyback:150] Out: -  Intake/Output this shift: No intake/output data recorded.  PE: General: Sleeping, easily roused. NAD Cardio: bradycardia and irregular rhythm. No M/G/R appreciated. Distal pulses all 2+ Pulm: normal effort, CTAB GI: soft, generalized TTP, distended and tympanic. Bowel sounds present. Skin: Warm, dry, intact  Lab Results:   Recent Labs  12/18/16 0327 12/19/16 0026  WBC 11.2* 10.1  HGB 12.0* 12.0*  HCT 37.3* 37.4*  PLT 139* 142*   BMET  Recent Labs  12/18/16 0327 12/19/16 0026  NA 140 142  K 4.3 4.1  CL 110 115*  CO2 24 22  GLUCOSE 124* 110*  BUN 22* 26*  CREATININE 1.19 1.22  CALCIUM 7.7* 7.4*   PT/INR  Recent Labs  12/17/16 2223  LABPROT 15.0  INR 1.17   CMP     Component Value Date/Time   NA 142 12/19/2016 0026   K 4.1 12/19/2016 0026   CL 115 (H) 12/19/2016 0026   CO2 22 12/19/2016 0026   GLUCOSE 110 (H) 12/19/2016 0026   BUN 26 (H) 12/19/2016 0026   CREATININE 1.22 12/19/2016 0026   CALCIUM 7.4 (L) 12/19/2016 0026   PROT 5.1 (L) 12/19/2016 0026   ALBUMIN 2.4 (L) 12/19/2016 0026   AST 26 12/19/2016 0026   ALT 14 (L) 12/19/2016 0026   ALKPHOS 67 12/19/2016 0026   BILITOT 1.5 (H) 12/19/2016 0026   GFRNONAA 53 (L) 12/19/2016 0026   GFRAA >60 12/19/2016 0026   Lipase     Component Value Date/Time   LIPASE 32 12/18/2016 0327     Studies/Results: Dg Chest 2  View  Result Date: 12/17/2016 CLINICAL DATA:  Sepsis.  Lethargic. EXAM: CHEST  2 VIEW COMPARISON:  None FINDINGS: Two views of the chest demonstrate low lung volumes. No focal airspace disease or frank pulmonary edema. Prior median sternotomy. Surgical hardware in the lumbar spine. Aortic atherosclerosis. Heart size is within normal limits. IMPRESSION: Low lung volumes.  No acute findings. Electronically Signed   By: Richarda Overlie M.D.   On: 12/17/2016 13:17   Ct Head Wo Contrast  Result Date: 12/17/2016 CLINICAL DATA:  Sepsis EXAM: CT HEAD WITHOUT CONTRAST TECHNIQUE: Contiguous axial images were obtained from the base of the skull through the vertex without intravenous contrast. COMPARISON:  None. FINDINGS: Brain: Moderate atrophy. Negative for hydrocephalus. Mild chronic microvascular ischemic change in the white matter and basal ganglia. Negative for acute infarct.  Negative for hemorrhage or mass. Vascular: Negative for hyperdense vessel Skull: Negative Sinuses/Orbits: Mucous retention cyst right maxillary sinus. Air-fluid level in the sphenoid sinus. Other: None IMPRESSION: Atrophy and chronic microvascular ischemic change. No acute intracranial abnormality Air-fluid level sphenoid sinus. Electronically Signed   By: Marlan Palau M.D.   On: 12/17/2016 12:37   Ct Abdomen Pelvis W Contrast  Result Date: 12/17/2016 CLINICAL DATA:  Upper quadrant pain. EXAM: CT ABDOMEN AND PELVIS WITH CONTRAST TECHNIQUE: Multidetector CT imaging of the abdomen  and pelvis was performed using the standard protocol following bolus administration of intravenous contrast. CONTRAST:  75mL ISOVUE-300 IOPAMIDOL (ISOVUE-300) INJECTION 61% COMPARISON:  None available FINDINGS: Lower chest: Trace bilateral effusions greater on the RIGHT. Hepatobiliary: Several low-density lesions in liver consistent benign hepatic cyst. No biliary duct dilatation. Gallbladder is distended to 4 cm. There is pericholecystic inflammation in the fat  adjacent to the gallbladder. There is a calculus in the neck of the gallbladder measuring 10 mm (image 25, series 3). Small amount pericholecystic fluid. The distal common bile duct is upper limits normal at 6 mm. No common bile duct stone identified by CT. Pancreas: No pancreatic inflammation or duct dilatation. Spleen: Normal spleen Adrenals/urinary tract: Adrenal glands and kidneys are normal. The ureters and bladder normal. Stomach/Bowel: Stomach and small bowel are normal. Appendix not identified. Surgical clips in the cecal region. Colon rectosigmoid colon normal. Vascular/Lymphatic: Abdominal aorta is normal caliber with atherosclerotic calcification. There is no retroperitoneal or periportal lymphadenopathy. No pelvic lymphadenopathy. Reproductive: Prostate normal Other: No free fluid. Musculoskeletal: No aggressive osseous lesion. IMPRESSION: 1. Findings consistent with acute cholecystitis. 2. Common bile duct upper limits of normal. 3.  Aortic Atherosclerosis (ICD10-I70.0). Electronically Signed   By: Genevive BiStewart  Edmunds M.D.   On: 12/17/2016 14:35    Anti-infectives: Anti-infectives    Start     Dose/Rate Route Frequency Ordered Stop   12/17/16 2100  piperacillin-tazobactam (ZOSYN) IVPB 3.375 g     3.375 g 12.5 mL/hr over 240 Minutes Intravenous Every 8 hours 12/17/16 1640     12/17/16 1345  piperacillin-tazobactam (ZOSYN) IVPB 3.375 g     3.375 g 100 mL/hr over 30 Minutes Intravenous  Once 12/17/16 1338 12/17/16 1533   12/17/16 1345  vancomycin (VANCOCIN) 1,500 mg in sodium chloride 0.9 % 500 mL IVPB     1,500 mg 250 mL/hr over 120 Minutes Intravenous  Once 12/17/16 1338 12/17/16 1833       Assessment/Plan Acute Cholecystitis - WBC 10.1, afebrile  - continue IV Zosyn - LFTs ok, Tbili 1.5 - keep patient NPO - spoke with Anesthesia about bradycardia and soft DBP - they recommend cards consult Sepsis Creatinine up - 1.22 Bradycardia/soft BP  FEN - NPO, IVF.  VTE - SCD ID - IV  Zosyn (6/20>>)  Plan: Cardiology consulted. Delay lap chole until patient is cleared by cardiology  LOS: 2 days    Wells GuilesKelly Rayburn , [facility] 12/19/2016, 7:45 AM Pager: [phone] Consults: [phone]  Agree with above. Wife,Sara, and daughter, Jay SchlichterDina Kuisting, at bedside. According to them, they think he looks and is acting better.  Addendum: Appreciate Dr. Anne FuSkains input.  Increased risk, but with acute cholecystitis - this seems the best option.  Talked to daughter about a perc drain, but we agree that he would pull out any tube in short order.  Will go ahead with cholecystectomy today.  Ovidio Kinavid Antwane Grose, MD, [facility] Pager: [phone] Office phone:  [phone]

## 2016-12-19 NOTE — Transfer of Care (Signed)
Immediate Anesthesia Transfer of Care Note  Patient: Travis MoloneyJohn Wallace  Procedure(s) Performed: Procedure(s): LAPAROSCOPIC CHOLECYSTECTOMY WITH INTRAOPERATIVE CHOLANGIOGRAM (N/A)  Patient Location: PACU  Anesthesia Type:General  Level of Consciousness: sedated  Airway & Oxygen Therapy: Patient Spontanous Breathing and Patient connected to face mask oxygen  Post-op Assessment: Report given to RN and Post -op Vital signs reviewed and stable  Post vital signs: Reviewed and stable  Last Vitals:  Vitals:   12/19/16 1200 12/19/16 1445  BP: (!) 140/53 (!) 99/43  Pulse: (!) 37   Resp: (!) 0 17  Temp:  36.7 C    Last Pain:  Vitals:   12/19/16 0800  TempSrc:   PainSc: 0-No pain         Complications: No apparent anesthesia complications

## 2016-12-19 NOTE — Op Note (Signed)
12/17/2016 - 12/19/2016  2:10 PM  PATIENT:  Travis MoloneyJohn Babe, 83 y.o., male, MRN: [id number]  PREOP DIAGNOSIS:  Cholecystitis, cholelithiasis  POSTOP DIAGNOSIS:   Gangrenous cholecystitis with focal abscess, cholelithiasis  PROCEDURE:   Procedure(s): LAPAROSCOPIC CHOLECYSTECTOMY WITH INTRAOPERATIVE CHOLANGIOGRAM (5 port)  SURGEON:   Ovidio Kinavid Savian Mazon, M.D.  Threasa HeadsASSISTANElveria Rising:   T. Rosenbower, M.D.  ANESTHESIA:   general  Anesthesiologist: Eilene Ghaziose, George, MD CRNA: Theodosia QuayKey, Kristopher, CRNA; Minerva EndsMirarchi, Angela M, CRNA  General  ASA: 4E  EBL:  75  ml  BLOOD ADMINISTERED: none  DRAINS: 19 F Blake drain  LOCAL MEDICATIONS USED:   30 cc 1/4% marcaine  SPECIMEN:   Gall bladder  COUNTS CORRECT:  YES  INDICATIONS FOR PROCEDURE:  Travis Wallace is a 83 y.o. (DOB: 01/07/1931) white male whose primary care physician is Bulla, Dorinda Hillonald, [place]-C and comes for cholecystectomy.  The patient has dementia and the conversations peri-operatively have been with his wife and daughter.   Because of bradycardia, he was seen by Dr. Michele RockersM. Skains, cardiology, pre op.  It was considered that he had a moderate risk for surgery, but the benefit exceeded the risk.  Because of his dementia, it was thought that he would not keep a percutaneous drain alone.   The indications and risks of the gall bladder surgery were explained to the patient.  The risks include, but are not limited to, infection, bleeding, common bile duct injury and open surgery.  SURGERY:  The [facility]  The abdomen was prepped with chloroprep.  The patient was on Zosyn prior to the beginning of the operation.   A time out was held and the surgical checklist run.   An infraumbilical incision was made into the abdominal cavity.  A 12 mm Hasson trocar was inserted into the abdominal cavity through the infraumbilical incision and secured with a 0 Vicryl suture.  Three additional trocars were inserted: a 10 mm trocar in the  sub-xiphoid location, a 5 mm trocar in the right mid subcostal area, and a 5 mm trocar in the right lateral subcostal area.  A 5th 5 mm port was placed in the upper abdomen between the umbilicus and xiphoid.   The abdomen was explored and the liver, stomach, and bowel that could be seen were unremarkable.  He has a lot of central obesity and omental fat.   The gall bladder was caked in the omentum.  I had to peel the omentum off the gall bladder.  The gall bladder was gangrenous, was infarcted with a focal abscess, was chronically and acutely inflamed.  I decompressed the gall bladder.  I grasped the gall bladder and rotated it cephalad.  Disssection was carried down to the gall bladder/cystic duct junction and the cystic duct isolated.  The cystic duct was partially necrotic.  A clip was placed on the gall bladder side of the cystic duct.   An intra-operative cholangiogram was shot.   The intra-operative cholangiogram was shot using a cut off Taut catheter placed through a 14 gauge angiocath in the RUQ.  The Taut catheter was inserted in the cut cystic duct and secured with an endoclip.  A cholangiogram was shot twice with 15 cc of 1/2 strength Isoview.  Using fluoroscopy, the cholangiogram showed the flow of contrast into the common bile duct, up the hepatic radicals, and into the duodenum.  The flow was poor, but showed normal anatomy.  The cholangiogram could have missed small stones.  There  was no mass or obstruction.  This was a normal intra-operative cholangiogram.   The Taut catheter was removed.  The cystic duct was tripley endoclipped and the cystic artery was identified and clipped.  The gall bladder was bluntly and sharpley dissected from the gall bladder bed.   After the gall bladder was removed from the liver, the gall bladder bed and Tri[place] of [place] were inspected.  There was no bleeding or bile leak.  But the gall bladder bed was beat up.  I placed Surgicel in the bad of the gall bladder.   The gall bladder was placed in a endocatch bag and delivered through the umbilicus.  The abdomen was irrigated with 3,000 cc saline.   The trocars were then removed.  I placed a 45 F Blake drain through the lateral infracostal port.  The tip of the drain was placed in the bed of the gall bladder.  I sewed the drain in with 2-0 nylon.   I infiltrated 30cc of 1/4% Marcaine into the incisions.  The umbilical port closed with a 0 Vicryl suture and the skin closed with 4-0 Monocryl.  The skin was painted with DermaBond.  The patient's sponge and needle count were correct.  The patient was transported to the RR in good condition.  Ovidio Kin, MD, [facility] Pager: [phone] Office phone:  [phone]

## 2016-12-19 NOTE — Progress Notes (Signed)
Initial Nutrition Assessment  DOCUMENTATION CODES:   Not applicable  INTERVENTION:  - Diet advancement as medically feasible. - RD will monitor for nutrition needs at follow-up.   NUTRITION DIAGNOSIS:   Inadequate oral intake related to inability to eat as evidenced by NPO status.  GOAL:   Patient will meet greater than or equal to 90% of their needs  MONITOR:   Diet advancement, Weight trends, Labs, Skin  REASON FOR ASSESSMENT:   Low Braden  ASSESSMENT:   83 y.o. male with medical history significant of CAD, HTN, HLD, dementia. Presented with 1-2 day history of worsening confusion and lethargy and fevers. Two days PTA family noted pt to be more lethargic. [facility] PTA and at that time was told he may have some sludge in his gallbladder but at that time showed no acute cholecystitis he was at that point complaining of some epigastric/chest pain.  Pt seen for low Braden. BMI indicates obesity. No family/visitors present at this time. Per doc flow sheet, pt a/o to self only. He is currently out of the room to diagnostic radiology and then to go to OR for cholecystectomy today; reported during rounds this AM.   Physical assessment unable to be done at this time but will be attempted at follow-up. Per chart review, [facility] and no other weight hx available in the chart.  Medications reviewed. Labs reviewed; Cl: 115 mmol/L, BUN: 26 mg/dL, Ca: 7.4 mg/dL, GFR: 53 mL/min.    Diet Order:  Diet NPO time specified  Skin:  Reviewed, no issues  Last BM:  PTA/unknown  Height:   Ht Readings from Last 1 Encounters:  12/17/16 5\' 4"  (1.626 m)    Weight:   Wt Readings from Last 1 Encounters:  12/17/16 176 lb 12.9 oz (80.2 kg)    Ideal Body Weight:  59.09 kg  BMI:  Body mass index is 30.35 kg/m.  Estimated Nutritional Needs:   Kcal:  1445-1685 (18-21 kcal/kg)  Protein:  70-80 grams  Fluid:  >/= 1.7  L/day  EDUCATION NEEDS:   No education needs identified at this time    Trenton GammonJessica Airi Copado, MS, RD, LDN, CNSC Inpatient Clinical Dietitian Pager # [phone] After hours/weekend pager # [phone]

## 2016-12-19 NOTE — Progress Notes (Signed)
PROGRESS NOTE    Travis Wallace  FAO:[id number] DOB: May 25, 1931 DOA: 12/17/2016 PCP: Doreen Salvage, PA-C   Brief Narrative:  Travis Wallace is a 83 y.o. male with medical history significant of CAD,  HTN, HLD, dementia and other comorbids who presented with 1-2 day history of worsening confusion and lethargy. He has had fevers but unable to provide detailed history. He was evaluated 5 days ago for Chest Pain and was found to have gallbladder sludge but no active cholecystitis at that time in [facility]. He presented to [facility] for worsening confusion and lethargy was worked up and found to be septic 2/2 to Acute Cholecystitis. General Surgery Consulted and are planning on taking the patient for Surgery later today (12/19/16). Patient became bradycardic overnight so Cardiology was consulted by General Surgery prior to his surgery for Cardiac Clearance.  Assessment & Plan:   Active Problems:   Acute cholecystitis   Cholangitis   Severe sepsis (HCC)   CAD (coronary artery disease)   HTN (hypertension)   OSA (obstructive sleep apnea)   Sepsis (HCC)   Dementia   Altered mental status   Cholecystitis   Sinus bradycardia   Hyperbilirubinemia   AKI (acute kidney injury) (HCC)  Severe sepsis (HCC) from Acute Cholecystitis  -Admitted per Sepsis protocol likely source being  intra-abdominal infection -Sepsis Physiology improving -Rehydrated with 58ml/kg -Initiated Broad spectrum antibiotics with IV Zosyn and will continue  -Obtained Blood Cultures and showed NGTD at 2 days -Lactic Acid level went from 2.23 -> 1.9 -> 1.3 -> 1.1 -Obtained procalcitonin level and was 2.39 -Admit and monitor vital signs closely -PCCM has been made aware -CT Abdomen and Pelvis shows Gallbladder is distended to 4 cm. There is pericholecystic inflammation in the fat adjacent to the gallbladder. There is a calculus in the neck of the gallbladder measuring 10 mm. Small amount pericholecystic fluid. The distal common  bile duct is upper limits normal at 6 mm. No common bile duct stone identified by CT. Findings were consistent with acute cholecystitis.  -WBC went from 11.2 -> 10.1 -Pain Control with 0.5 mg IV Dilaudid q4hprn for Severe Pain and Zofran po/IV for Nausea/Vomiting -C/w IVF Rehydration with NS at 100 mL/hr x 10 hours and then 75 mL/hr x 10 hours -General Surgery Consulted for further evaluation and Management -General Surgery to take the patient for cholecystectomy later today on Friday December 19, 2016 -Cardiology was consulted for Pre-Op Clearance by General Surgery and he is at a moderate risk from a Cardiac Standpoint for Cholecystectomy -Repeat CBC in AM  Acute cholecystitis  -Appreciated surgical consult,  -Will treat with IV antibiotics and continue Zosyn,  -Await results of blood cultures; Showed NGTD at 1 Day -Keep nothing by mouth and rehydrate.  -Defer to Surgery regarding timing of cholecystectomy -As Above  Lactic Acidosis 2/2 to Sepsis -Improved.  -Lactic Acid level went from 2.23 -> 1.9 -> 1.3 -> 1.1 -C/w IVF Rehydration with NS at 100 mL/hr x 10 hours and then 75 mL/hr x 10 hours  CAD (coronary artery disease)  -Stable,  -Hold home medications while NPO,  -per wife has been asymptomatic since original CABG years ago -Cardiology Evaluated and appreciated Recc's    Transient Hypotension but has Hx of HTN  -Hold home medications while septic  -Allow permissive hypertension  -Was Bolused with 1 Liter of NS last night -Most Recent BP was 140/53 -Continue to Monitor  Sinus Bradycardia -Not on any AV Nodal Blocking Agents -Donepezil discontinued -Cardiology  feels as if it is likely enhanced by increased vagal tone -May need Atropine at times or a Beta Agonist such as Dopamine -Patient received 1 mg Atropine preeoperatively -Possible ECHOCardiogram but will defer to Cardiology  OSA (obstructive sleep apnea) -Patient does not tolerate CPAP  Dementia  -Expect some  degree of sundowning,  -Patient altered at baseline but due to infection have been more significantly confused. -Donepezil Held during admission   Mild Hyperbilirubinemia -T Bili went from 1.4 -> 1.3 -> 1.5 -Likely from Cholecystitis  -Continue to Monitor and repeat CMP in AM  ? AKI -No Prior Baseline -Patient's BUN/Cr went from 26/1.45 -> 22/1.19 -> 26/1.22 -Continue to Monitor and Repeat CMP in AM  DVT prophylaxis: SCDs Code Status: FULL CODE Family Communication: No Family present at bedside Disposition Plan: Remain inpatient in SDU as patient is going for Cholecystectomy    Consultants:   General Surgery Dr. Ezzard StandingNewman  Cardiology Dr. Anne FuSkains   Procedures:    Antimicrobials:  Anti-infectives    Start     Dose/Rate Route Frequency Ordered Stop   12/19/16 1201  piperacillin-tazobactam (ZOSYN) 3.375 GM/50ML IVPB    Comments:  Wylene SimmerShepherd, Karen   : cabinet override      12/19/16 1201 12/19/16 1239   12/17/16 2100  [MAR Hold]  piperacillin-tazobactam (ZOSYN) IVPB 3.375 g     (MAR Hold since 12/19/16 1119)   3.375 g 12.5 mL/hr over 240 Minutes Intravenous Every 8 hours 12/17/16 1640     12/17/16 1345  piperacillin-tazobactam (ZOSYN) IVPB 3.375 g     3.375 g 100 mL/hr over 30 Minutes Intravenous  Once 12/17/16 1338 12/17/16 1533   12/17/16 1345  vancomycin (VANCOCIN) 1,500 mg in sodium chloride 0.9 % 500 mL IVPB     1,500 mg 250 mL/hr over 120 Minutes Intravenous  Once 12/17/16 1338 12/17/16 1833     Subjective: Seen and examined and was was more alert but still confused. Per family he appeared improved. Complained of Abdominal Pain when touched. He started having Sinus Bradycardia overnight so Cardiology was consulted by General Surgery. Patient denied any Chest Pain currently.   Objective: Vitals:   12/19/16 1130 12/19/16 1131 12/19/16 1132 12/19/16 1200  BP:    (!) 140/53  Pulse: 62 (!) 57 60 (!) 37  Resp: 16 (!) 23 (!) 21 (!) 0  Temp:      TempSrc:      SpO2: 94%  93% 93% 93%  Weight:      Height:        Intake/Output Summary (Last 24 hours) at 12/19/16 1248 Last data filed at 12/19/16 1229  Gross per 24 hour  Intake           1797.5 ml  Output                0 ml  Net           1797.5 ml   Filed Weights   12/17/16 1437 12/17/16 2235  Weight: 81.6 kg (180 lb) 80.2 kg (176 lb 12.9 oz)   Examination: Physical Exam:  Constitutional: Pleasantly confused and demented elderly Caucasian male appears to be in NAD.  Eyes: Sclerae Anicteric. Conjunctiva Non-injected ENMT:  Grossly normal hearing. External ears and nose appear normal Neck: Supple with no visible JVD. Respiratory: CTAB. No wheezing/rales/rhonchi. Patient not tachypenic or using any accessory muscles to breathe Cardiovascular: Bradycardic rate with some PAC's. No appreciable lower extremity edema Abdomen: Soft. Tender to palpate diffusely. Non-distended. GU: Deferred Musculoskeletal:  No contractures. No cyanosis Skin: Warm and Dry. No rashes or lesions on a limited skin evaluation. Neurologic: CN 2-12 grossly intact. No appreciable focal deficits Psychiatric: Confused. Awake but not oriented.   Data Reviewed: I have personally reviewed following labs and imaging studies  CBC:  Recent Labs Lab 12/17/16 1154 12/18/16 0327 12/19/16 0026  WBC 12.9* 11.2* 10.1  NEUTROABS 11.7*  --  8.7*  HGB 14.7 12.0* 12.0*  HCT 44.8 37.3* 37.4*  MCV 93.1 93.7 93.5  PLT 159 139* 142*   Basic Metabolic Panel:  Recent Labs Lab 12/17/16 1154 12/17/16 1700 12/18/16 0327 12/19/16 0026  NA 137 139 140 142  K 4.8 4.6 4.3 4.1  CL 104 106 110 115*  CO2 24 24 24 22   GLUCOSE 143* 141* 124* 110*  BUN 26* 26* 22* 26*  CREATININE 1.42* 1.45* 1.19 1.22  CALCIUM 8.7* 8.0* 7.7* 7.4*  MG  --   --  2.1 2.3  PHOS  --   --  2.5 3.0   GFR: Estimated Creatinine Clearance: 43.9 mL/min (by C-G formula based on SCr of 1.22 mg/dL). Liver Function Tests:  Recent Labs Lab 12/17/16 1154  12/17/16 1700 12/18/16 0327 12/19/16 0026  AST 26 24 21 26   ALT 16* 14* 13* 14*  ALKPHOS 105 86 76 67  BILITOT 1.9* 1.4* 1.3* 1.5*  PROT 7.2 6.4* 5.7* 5.1*  ALBUMIN 3.7 3.1* 2.8* 2.4*    Recent Labs Lab 12/17/16 1154 12/18/16 0327  LIPASE 32 32   No results for input(s): AMMONIA in the last 168 hours. Coagulation Profile:  Recent Labs Lab 12/17/16 2223  INR 1.17   Cardiac Enzymes: No results for input(s): CKTOTAL, CKMB, CKMBINDEX, TROPONINI in the last 168 hours. BNP (last 3 results) No results for input(s): PROBNP in the last 8760 hours. HbA1C: No results for input(s): HGBA1C in the last 72 hours. CBG: No results for input(s): GLUCAP in the last 168 hours. Lipid Profile: No results for input(s): CHOL, HDL, LDLCALC, TRIG, CHOLHDL, LDLDIRECT in the last 72 hours. Thyroid Function Tests:  Recent Labs  12/18/16 0327  TSH 1.394   Anemia Panel: No results for input(s): VITAMINB12, FOLATE, FERRITIN, TIBC, IRON, RETICCTPCT in the last 72 hours. Sepsis Labs:  Recent Labs Lab 12/17/16 2043 12/17/16 2210 12/18/16 0015 12/18/16 2208 12/19/16 0026  PROCALCITON 2.39  --   --   --   --   LATICACIDVEN  --  1.6 1.9 1.3 1.1    Recent Results (from the past 240 hour(s))  Urine culture     Status: None   Collection Time: 12/17/16 11:54 AM  Result Value Ref Range Status   Specimen Description URINE, RANDOM  Final   Special Requests NONE  Final   Culture   Final    NO GROWTH Performed at [facility], [address] [place], [place] [address]    Report Status 12/18/2016 FINAL  Final  Blood Culture (routine x 2)     Status: None (Preliminary result)   Collection Time: 12/17/16 12:00 PM  Result Value Ref Range Status   Specimen Description BLOOD RIGHT WRIST  Final   Special Requests   Final    BOTTLES DRAWN AEROBIC AND ANAEROBIC Blood Culture adequate volume   Culture   Final    NO GROWTH 1 DAY Performed at [facility], [address]  [place], [place] [address]    Report Status PENDING  Incomplete  Blood Culture (routine x 2)  Status: None (Preliminary result)   Collection Time: 12/17/16  1:20 PM  Result Value Ref Range Status   Specimen Description BLOOD LEFT ANTECUBITAL  Final   Special Requests   Final    BOTTLES DRAWN AEROBIC AND ANAEROBIC Blood Culture adequate volume   Culture   Final    NO GROWTH 1 DAY Performed at [facility], [address], [place], [place] [address]    Report Status PENDING  Incomplete  MRSA PCR Screening     Status: None   Collection Time: 12/17/16 10:36 PM  Result Value Ref Range Status   MRSA by PCR NEGATIVE NEGATIVE Final    Comment:        The GeneXpert MRSA Assay (FDA approved for NASAL specimens only), is one component of a comprehensive MRSA colonization surveillance program. It is not intended to diagnose MRSA infection nor to guide or monitor treatment for MRSA infections.     Radiology Studies: Dg Chest 2 View  Result Date: 12/17/2016 CLINICAL DATA:  Sepsis.  Lethargic. EXAM: CHEST  2 VIEW COMPARISON:  None FINDINGS: Two views of the chest demonstrate low lung volumes. No focal airspace disease or frank pulmonary edema. Prior median sternotomy. Surgical hardware in the lumbar spine. Aortic atherosclerosis. Heart size is within normal limits. IMPRESSION: Low lung volumes.  No acute findings. Electronically Signed   By: Richarda Overlie M.D.   On: 12/17/2016 13:17   Ct Abdomen Pelvis W Contrast  Result Date: 12/17/2016 CLINICAL DATA:  Upper quadrant pain. EXAM: CT ABDOMEN AND PELVIS WITH CONTRAST TECHNIQUE: Multidetector CT imaging of the abdomen and pelvis was performed using the standard protocol following bolus administration of intravenous contrast. CONTRAST:  75mL ISOVUE-300 IOPAMIDOL (ISOVUE-300) INJECTION 61% COMPARISON:  None available FINDINGS: Lower chest: Trace bilateral effusions greater on the RIGHT. Hepatobiliary: Several low-density lesions in liver consistent  benign hepatic cyst. No biliary duct dilatation. Gallbladder is distended to 4 cm. There is pericholecystic inflammation in the fat adjacent to the gallbladder. There is a calculus in the neck of the gallbladder measuring 10 mm (image 25, series 3). Small amount pericholecystic fluid. The distal common bile duct is upper limits normal at 6 mm. No common bile duct stone identified by CT. Pancreas: No pancreatic inflammation or duct dilatation. Spleen: Normal spleen Adrenals/urinary tract: Adrenal glands and kidneys are normal. The ureters and bladder normal. Stomach/Bowel: Stomach and small bowel are normal. Appendix not identified. Surgical clips in the cecal region. Colon rectosigmoid colon normal. Vascular/Lymphatic: Abdominal aorta is normal caliber with atherosclerotic calcification. There is no retroperitoneal or periportal lymphadenopathy. No pelvic lymphadenopathy. Reproductive: Prostate normal Other: No free fluid. Musculoskeletal: No aggressive osseous lesion. IMPRESSION: 1. Findings consistent with acute cholecystitis. 2. Common bile duct upper limits of normal. 3.  Aortic Atherosclerosis (ICD10-I70.0). Electronically Signed   By: Genevive Bi M.D.   On: 12/17/2016 14:35   Scheduled Meds: . [MAR Hold] chlorhexidine  15 mL Mouth Rinse BID  . [MAR Hold] mouth rinse  15 mL Mouth Rinse q12n4p  . [MAR Hold] sodium chloride flush  3 mL Intravenous Q12H   Continuous Infusions: . DOPamine    . [MAR Hold] piperacillin-tazobactam (ZOSYN)  IV 0 g (12/19/16 0921)    LOS: 2 days   Merlene Laughter, DO [facility] Pager [phone]  If 7PM-7AM, please contact night-coverage [email or web address] Password [email or web address] 12/19/2016, 12:48 PM

## 2016-12-19 NOTE — Consult Note (Signed)
Patient ID: Travis Wallace MRN: [id number], DOB/AGE: [birth year]   Admit date: 12/17/2016  Reason for Consult: Pre-operative Assessment/Cardiac Clearance  Requesting Physician: Dr. Marland Mcalpine, Internal Medicine   Primary Physician: Doreen Salvage, PA-C Primary Cardiologist: New (Dr. Anne Fu )   Pt. Profile:  Travis Wallace is a 83 y.o. male, with a reported h/o CAD s/p CABG at age 55 (no cardiac records on file), who was admitted for acute cholecystitis, awaiting lap chole, who is being seen today for preoperative assessment for cardiac clearance, given bradycardia and hypotension, at the request of Dr. Marland Mcalpine, Internal Medicine.   Problem List  Past Medical History:  Diagnosis Date  . Alzheimer's dementia   . Coronary artery disease   . Depression   . Dyslipidemia   . GERD (gastroesophageal reflux disease)   . Hypertension   . Insomnia   . Restless leg   . Sleep apnea     Past Surgical History:  Procedure Laterality Date  . BACK SURGERY     x2  . CARDIAC CATHETERIZATION    . CATARACT EXTRACTION, BILATERAL    . CORONARY ARTERY BYPASS GRAFT     X 3 VESSELS  . EYE SURGERY       Allergies  No Known Allergies  HPI  Travis Wallace is a 83 y.o. male, with a reported h/o CAD s/p CABG at age 29 (no cardiac records on file), who was admitted for acute cholecystitis, awaiting lap chole, who is being seen today for preoperative assessment for cardiac clearance, given bradycardia and hypotension, at the request of Dr. Marland Mcalpine, Internal Medicine.   The patient has dementia and resides at [name] in the Memory Care Unit. His wife and daughter are currently by his bedside and provide all of his history. He had bypass surgery nearly 30 years ago at [facility] in [place]. His daughter denies any h/o MI. They report he has done well since his bypass w/o recurrent CP or major limitations from a cardiac standpoint. His wife notes that he had a repeat LHC 18 years following his  bypass and was noted to be stable w/o requiring any further intervention. He was moved to [place] 3 years ago by his daughter, given his advancing dementia. Since his move, he has not established care with a local cardiologist. He is followed medically at [facility]. They deny any history of syncope.   Per H&P, he initially presented to the hospital with fever, increased confusion, generalized weakness and abdominal pain. CT of the abdomen showed findings c/w acute cholecystitis. Plan was to take to the OR for lap chole, however given his vital signs, the anesthesiologist recommended cardiac consultation for surgical clearance.   His lowest pulse rate has been documented in the 30s. Systolic BPs have been mostly stable, however diastolic BPs have been as low as the 30s and 40s. K is WNL at 4.1. Mg is 2.3. TSH is normal at 1.39. Renal function is normal. He has mild anemia with Hgb at 12. Calcium is low at 7.4. EKG shows bradycardia with rate in the upper 50s with PACs. Telemetry shows sinus brady with rate in the mid 40s. No echocardiogram on file. His home meds have been reviewed. He is not on any AV nodal blocking agents. However he is on Memantine HCL-Donepezil, which can cause significant bradycardia. He is also on several meds affecting BP including amlodipine and benazepril, 5/20 mg, for BP as well as Cardura.    Home Medications  Prior to  Admission medications   Medication Sig Start Date End Date Taking? Authorizing Provider  amLODipine-benazepril (LOTREL) 5-20 MG capsule Take 1 capsule by mouth daily. 11/03/14  Yes [provider]  aspirin EC 81 MG tablet Take 81 mg by mouth daily.   Yes [provider]  atorvastatin (LIPITOR) 20 MG tablet Take 20 mg by mouth at bedtime.   Yes [provider]  cetirizine (ZYRTEC) 10 MG tablet Take 10 mg by mouth daily.   Yes [provider]  doxazosin (CARDURA) 4 MG tablet Take 4 mg by mouth daily.   Yes [provider]  lactulose (CHRONULAC) 10 GM/15ML solution Take 30 mLs by mouth daily. 11/13/16  Yes [provider]  lansoprazole (PREVACID) 30 MG capsule Take 30 mg by mouth daily.   Yes [provider]  magnesium gluconate (MAGONATE) 500 MG tablet Take 500 mg by mouth daily.   Yes [provider]  Memantine HCl-Donepezil HCl (NAMZARIC) 28-10 MG CP24 Take 1 capsule by mouth daily.   Yes [provider]  niacin 500 MG tablet Take 500 mg by mouth daily.   Yes [provider]  QUEtiapine (SEROQUEL) 25 MG tablet Take 75 mg by mouth at bedtime. 12/05/16  Yes [provider]  QUEtiapine (SEROQUEL) 25 MG tablet Take 25 mg by mouth daily as needed (restlessness).   Yes [provider]  REQUIP 1 MG tablet Take 1 mg by mouth at bedtime.  11/07/16  Yes [provider]  traMADol (ULTRAM) 50 MG tablet Take 50 mg by mouth 2 (two) times daily. 12/04/16  Yes [provider]    Hospital Medications  . chlorhexidine  15 mL Mouth Rinse BID  . mouth rinse  15 mL Mouth Rinse q12n4p  . sodium chloride flush  3 mL Intravenous Q12H   . piperacillin-tazobactam (ZOSYN)  IV 3.375 g (12/19/16 0521)   acetaminophen **OR** acetaminophen, HYDROmorphone (DILAUDID) injection, LORazepam, ondansetron **OR** ondansetron (ZOFRAN) IV  Family History  Family History  Problem Relation Age of Onset  . Pancreatic cancer Mother   . Hypertension Father   . CAD Father   . Stroke Father   . CAD Sister   . Diabetes Neg Hx     Social History  Social History   Social History  . Marital status: Married    Spouse name: N/A  . Number of children: N/A  . Years of education: N/A   Occupational History  . Not on file.   Social History Main Topics  . Smoking status: Never Smoker  . Smokeless tobacco: Never Used  . Alcohol use No  . Drug use: No  . Sexual activity: No   Other Topics Concern  . Not on file   Social History Narrative  . No  narrative on file     Review of Systems General:  No chills, fever, night sweats or weight changes.  Cardiovascular:  No chest pain, dyspnea on exertion, edema, orthopnea, palpitations, paroxysmal nocturnal dyspnea. Dermatological: No rash, lesions/masses Respiratory: No cough, dyspnea Urologic: No hematuria, dysuria Abdominal:   No nausea, vomiting, diarrhea, bright red blood per rectum, melena, or hematemesis Neurologic:  No visual changes, wkns, changes in mental status. All other systems reviewed and are otherwise negative except as noted above.  Physical Exam  Blood pressure (!) 138/40, pulse (!) 32, temperature 98.4 F (36.9 C), temperature source Axillary, resp. rate 14, height 5\' 4"  (1.626 m), weight 176 lb 12.9 oz (80.2 kg), SpO2 96 %.  General: Pleasant,  NAD, dementia at baseline Psych: Normal affect. Neuro: Alert and oriented X 3. Moves all extremities spontaneously. HEENT: Normal  Neck: Supple without bruits or JVD. Lungs:  Resp regular and unlabored, CTA. Heart: Regular rhythm, bradycardia Abdomen: Soft, non-tender, non-distended, BS + x 4.  Extremities: No clubbing, cyanosis or edema. DP/PT/Radials 2+ and equal bilaterally.  Labs  Troponin (Point of Care Test) No results for input(s): TROPIPOC in the last 72 hours. No results for input(s): CKTOTAL, CKMB, TROPONINI in the last 72 hours. Lab Results  Component Value Date   WBC 10.1 12/19/2016   HGB 12.0 (L) 12/19/2016   HCT 37.4 (L) 12/19/2016   MCV 93.5 12/19/2016   PLT 142 (L) 12/19/2016    Recent Labs Lab 12/19/16 0026  NA 142  K 4.1  CL 115*  CO2 22  BUN 26*  CREATININE 1.22  CALCIUM 7.4*  PROT 5.1*  BILITOT 1.5*  ALKPHOS 67  ALT 14*  AST 26  GLUCOSE 110*   No results found for: CHOL, HDL, LDLCALC, TRIG No results found for: DDIMER   Radiology/Studies  Dg Chest 2 View  Result Date: 12/17/2016 CLINICAL DATA:  Sepsis.  Lethargic. EXAM: CHEST  2 VIEW COMPARISON:  None FINDINGS: Two views of  the chest demonstrate low lung volumes. No focal airspace disease or frank pulmonary edema. Prior median sternotomy. Surgical hardware in the lumbar spine. Aortic atherosclerosis. Heart size is within normal limits. IMPRESSION: Low lung volumes.  No acute findings. Electronically Signed   By: Richarda Overlie M.D.   On: 12/17/2016 13:17   Ct Head Wo Contrast  Result Date: 12/17/2016 CLINICAL DATA:  Sepsis EXAM: CT HEAD WITHOUT CONTRAST TECHNIQUE: Contiguous axial images were obtained from the base of the skull through the vertex without intravenous contrast. COMPARISON:  None. FINDINGS: Brain: Moderate atrophy. Negative for hydrocephalus. Mild chronic microvascular ischemic change in the white matter and basal ganglia. Negative for acute infarct.  Negative for hemorrhage or mass. Vascular: Negative for hyperdense vessel Skull: Negative Sinuses/Orbits: Mucous retention cyst right maxillary sinus. Air-fluid level in the sphenoid sinus. Other: None IMPRESSION: Atrophy and chronic microvascular ischemic change. No acute intracranial abnormality Air-fluid level sphenoid sinus. Electronically Signed   By: Marlan Palau M.D.   On: 12/17/2016 12:37   Ct Abdomen Pelvis W Contrast  Result Date: 12/17/2016 CLINICAL DATA:  Upper quadrant pain. EXAM: CT ABDOMEN AND PELVIS WITH CONTRAST TECHNIQUE: Multidetector CT imaging of the abdomen and pelvis was performed using the standard protocol following bolus administration of intravenous contrast. CONTRAST:  75mL ISOVUE-300 IOPAMIDOL (ISOVUE-300) INJECTION 61% COMPARISON:  None available FINDINGS: Lower chest: Trace bilateral effusions greater on the RIGHT. Hepatobiliary: Several low-density lesions in liver consistent benign hepatic cyst. No biliary duct dilatation. Gallbladder is distended to 4 cm. There is pericholecystic inflammation in the fat adjacent to the gallbladder. There is a calculus in the neck of the gallbladder measuring 10 mm (image 25, series 3). Small amount  pericholecystic fluid. The distal common bile duct is upper limits normal at 6 mm. No common bile duct stone identified by CT. Pancreas: No pancreatic inflammation or duct dilatation. Spleen: Normal spleen Adrenals/urinary tract: Adrenal glands and kidneys are normal. The ureters and bladder normal. Stomach/Bowel: Stomach and small bowel are normal. Appendix not identified. Surgical clips in the cecal region. Colon rectosigmoid colon normal. Vascular/Lymphatic: Abdominal aorta is normal caliber with atherosclerotic calcification. There is no retroperitoneal or periportal lymphadenopathy. No pelvic lymphadenopathy. Reproductive: Prostate normal Other: No free fluid. Musculoskeletal: No aggressive osseous  lesion. IMPRESSION: 1. Findings consistent with acute cholecystitis. 2. Common bile duct upper limits of normal. 3.  Aortic Atherosclerosis (ICD10-I70.0). Electronically Signed   By: Genevive BiStewart  Edmunds M.D.   On: 12/17/2016 14:35    ECG  Sinus bradycardia with PACs -- personally reviewed  Telemetry  Sinus bradycardia in the 40s with PAC-- personally reviewed    ASSESSMENT AND PLAN  Active Problems:   Acute cholecystitis   Cholangitis   Severe sepsis (HCC)   CAD (coronary artery disease)   HTN (hypertension)   OSA (obstructive sleep apnea)   Sepsis (HCC)   Dementia   Altered mental status   Cholecystitis   Assessment: this is a 83 y/o male with advanced dementia with reported history of CABG nearly 30 years ago, [place] an outside hospital in [place], with subsequent LHC 18 years later showing stable disease/ grafts w/o need for addition intervention, per family report. There is no known h/o HF and no h/o syncope. Given his dementia, we are unable to get any history from the patient himself. He has not been followed by a cardiologist in many years. His family reports that prior to this hospitalization, he was getting around well, ambulating w/o signs of physical limitation, dyspnea or  appearance of pain or discomfort. Physical exam reveals a well developed male in no acute distress, regular rhythm with bradycardia. No significant murmurs. EKG and telemetry have shown sinus bradycardia with rates in the 40s and PACs. K, Mg, TSH all WNL. Home medications reviewed. He has not been on any AV nodal blocking agents, however he is on Memantine HCL-Donepezil for his dementia. Per literature review, cholinesterase inhibitors may have a vasotonic effect which may cause bradycardia and or heart block, with or without h/o cardiac disease. This is likely the cause of his bradycardia. He is also on several agents that impact BP, including amlodipine, benazepril and Cardura. His bradycardia has been as low as the 30s and diastolic BPs in the 30s-40s. We will await MD review for clearance. Plan is for possible lap chole for acute cholecystitis. If cleared for surgery by MD, may need pressor support and possible use of atropine, if needed during surgery, to keep vitals stable. 2D echo may also be useful. Will defer to MD.    Signed, Robbie LisBrittainy Simmons, PA-C, MHS 12/19/2016, 9:05 AM [facility] Pager: [phone]  Personally seen and examined. Agree with above.  83 year old male post bypass surgery several years ago now with cholecystitis with sinus bradycardia transiently at times in the 40s/30s with transient hypotension.  - His bradycardia may be enhanced by pain response/vagal tone given his intra-abdominal process.  - He is currently not on any AV nodal blocking agents.   - Discussed with family that donepezil or memantine sometimes can increase incidence of bradycardia and I would be an advocate for discontinuation of this medication. He has been on it for over 3 years, daughter wonders if it is having any effect currently. He has not taken it since he has been in the hospital.  - There is no report of anginal symptoms prior to this hospitalization. Dimension noted. Unable to obtain history from  he himself.  He is laying comfortably in bed if abdomen is touched, he hurts, does not appear to be in pain currently. Regular rate and rhythm, lungs are clear, abdomen protuberant.  Preoperative cardiac risk  - He is at moderate risk from a cardiac standpoint for cholecystectomy. Given his current situation if he must go to the operating  room he may proceed.  - Atropine may be needed at times or perhaps beta agonist such as dopamine to help improve both heart rate as well as blood pressure. Hopefully this will not be the case. I do not think he needs a temporary pacemaker wire based upon his current clinical situation. Discussed this with family.  - Family is okay with proceeding with surgery and understands potential risks.  Sinus bradycardia  -Normal PR interval, no signs of AV conduction delay.  - Likely enhanced by increased vagal tone.  Wide pulse pressure  - I do not hear any significant murmur such as aortic regurgitation. Wide pulse pressure sometimes seen with elderly patients secondary to increased vascular stiffness. His systolic pressure is adequate.  Please let us know if we be of further assistance.  Donato Schultz, MD

## 2016-12-19 NOTE — Anesthesia Postprocedure Evaluation (Signed)
Anesthesia Post Note  Patient: Travis MoloneyJohn Bonsell  Procedure(s) Performed: Procedure(s) (LRB): LAPAROSCOPIC CHOLECYSTECTOMY WITH INTRAOPERATIVE CHOLANGIOGRAM (N/A)     Patient location during evaluation: PACU Anesthesia Type: General Level of consciousness: awake and alert Pain management: pain level controlled Vital Signs Assessment: post-procedure vital signs reviewed and stable Respiratory status: spontaneous breathing, nonlabored ventilation and respiratory function stable Cardiovascular status: blood pressure returned to baseline and stable Postop Assessment: no signs of nausea or vomiting Anesthetic complications: no    Last Vitals:  Vitals:   12/19/16 1515 12/19/16 1535  BP: (!) 107/44 (!) 110/35  Pulse: (!) 55 (!) 54  Resp: 18 18  Temp: 36.7 C 36.9 C    Last Pain:  Vitals:   12/19/16 0800  TempSrc:   PainSc: 0-No pain                 Lowella CurbWarren Ray Danylle Ouk

## 2016-12-20 ENCOUNTER — Inpatient Hospital Stay (HOSPITAL_COMMUNITY): Payer: Medicare Other

## 2016-12-20 DIAGNOSIS — R001 Bradycardia, unspecified: Secondary | ICD-10-CM

## 2016-12-20 DIAGNOSIS — R740 Nonspecific elevation of levels of transaminase and lactic acid dehydrogenase [LDH]: Secondary | ICD-10-CM

## 2016-12-20 LAB — COMPREHENSIVE METABOLIC PANEL
ALT: 33 U/L (ref 17–63)
AST: 45 U/L — ABNORMAL HIGH (ref 15–41)
Albumin: 2.2 g/dL — ABNORMAL LOW (ref 3.5–5.0)
Alkaline Phosphatase: 60 U/L (ref 38–126)
Anion gap: 6 (ref 5–15)
BUN: 29 mg/dL — ABNORMAL HIGH (ref 6–20)
CHLORIDE: 116 mmol/L — AB (ref 101–111)
CO2: 22 mmol/L (ref 22–32)
CREATININE: 1.2 mg/dL (ref 0.61–1.24)
Calcium: 7.8 mg/dL — ABNORMAL LOW (ref 8.9–10.3)
GFR calc non Af Amer: 54 mL/min — ABNORMAL LOW (ref >60)
Glucose, Bld: 152 mg/dL — ABNORMAL HIGH (ref 65–99)
Potassium: 4.4 mmol/L (ref 3.5–5.1)
SODIUM: 144 mmol/L (ref 135–145)
Total Bilirubin: 0.9 mg/dL (ref 0.3–1.2)
Total Protein: 5.6 g/dL — ABNORMAL LOW (ref 6.5–8.1)

## 2016-12-20 LAB — CREATININE, SERUM: CREATININE: 1.1 mg/dL (ref 0.61–1.24)

## 2016-12-20 LAB — CBC WITH DIFFERENTIAL/PLATELET
BASOS PCT: 0 %
Basophils Absolute: 0 10*3/uL (ref 0.0–0.1)
EOS ABS: 0 10*3/uL (ref 0.0–0.7)
EOS PCT: 0 %
HCT: 35.3 % — ABNORMAL LOW (ref 39.0–52.0)
Hemoglobin: 11.6 g/dL — ABNORMAL LOW (ref 13.0–17.0)
Lymphocytes Relative: 6 %
Lymphs Abs: 0.5 10*3/uL — ABNORMAL LOW (ref 0.7–4.0)
MCH: 30.4 pg (ref 26.0–34.0)
MCHC: 32.9 g/dL (ref 30.0–36.0)
MCV: 92.4 fL (ref 78.0–100.0)
Monocytes Absolute: 0.4 10*3/uL (ref 0.1–1.0)
Monocytes Relative: 5 %
Neutro Abs: 7.9 10*3/uL — ABNORMAL HIGH (ref 1.7–7.7)
Neutrophils Relative %: 89 %
PLATELETS: 189 10*3/uL (ref 150–400)
RBC: 3.82 MIL/uL — AB (ref 4.22–5.81)
RDW: 13.3 % (ref 11.5–15.5)
WBC: 8.8 10*3/uL (ref 4.0–10.5)

## 2016-12-20 LAB — PHOSPHORUS: PHOSPHORUS: 2.9 mg/dL (ref 2.5–4.6)

## 2016-12-20 LAB — MAGNESIUM: Magnesium: 2.6 mg/dL — ABNORMAL HIGH (ref 1.7–2.4)

## 2016-12-20 LAB — ECHOCARDIOGRAM COMPLETE
HEIGHTINCHES: 64 in
WEIGHTICAEL: 2828.94 [oz_av]

## 2016-12-20 MED ORDER — QUETIAPINE FUMARATE 25 MG PO TABS
25.0000 mg | ORAL_TABLET | Freq: Every day | ORAL | Status: DC | PRN
  Filled 2016-12-20: qty 1

## 2016-12-20 NOTE — Progress Notes (Signed)
PROGRESS NOTE    Travis Wallace  LKG:[id number] DOB: [birth year] DOA: 12/17/2016 PCP: Doreen Salvage, PA-C   Brief Narrative:  Travis Wallace is a 83 y.o. male with medical history significant of CAD,  HTN, HLD, dementia and other comorbids who presented with 1-2 day history of worsening confusion and lethargy. He has had fevers but unable to provide detailed history. He was evaluated 5 days ago for Chest Pain and was found to have gallbladder sludge but no active cholecystitis at that time in [facility]. He presented to [facility] for worsening confusion and lethargy was worked up and found to be septic 2/2 to Acute Cholecystitis. General Surgery Consulted and are planning on taking the patient for Surgery later today (12/19/16). Patient became bradycardic on the night of 12/17/16-6/21/18so Cardiology was consulted by General Surgery prior to his surgery for Cardiac Clearance. Patient was cleared and taken to surgery yesterday and found to have a gangrenous cholecystitis with focal abscess and cholelithiasis. He is improving but remained Bradycardic so an ECHOCardiogram was done this AM. Patient was somnolent but arousable during exam. Patient denying any symptoms this AM.    Assessment & Plan:   Active Problems:   Acute cholecystitis   Cholangitis   Severe sepsis (HCC)   CAD (coronary artery disease)   HTN (hypertension)   OSA (obstructive sleep apnea)   Sepsis (HCC)   Dementia   Altered mental status   Cholecystitis   Sinus bradycardia   Hyperbilirubinemia   AKI (acute kidney injury) (HCC)  Severe sepsis (HCC) from Acute Gangrenous Cholecystitis with Focal Abscess and Cholelithiasis POD 1, improving  -Admitted per Sepsis protocol likely source being  intra-abdominal infection -Sepsis Physiology improving -Rehydrated with 65ml/kg -Initiated Broad spectrum antibiotics with IV Zosyn and will continue  -Obtained Blood Cultures and showed NGTD at 3 days -Lactic Acid level went from 2.23 ->  1.9 -> 1.3 -> 1.1 -Obtained procalcitonin level and was 2.39 -CT Abdomen and Pelvis shows Gallbladder is distended to 4 cm. There is pericholecystic inflammation in the fat adjacent to the gallbladder. There is a calculus in the neck of the gallbladder measuring 10 mm. Small amount pericholecystic fluid. The distal common bile duct is upper limits normal at 6 mm. No common bile duct stone identified by CT. Findings were consistent with acute cholecystitis.  -WBC went from 11.2 -> 10.1 -> 8.8 -Pain Control with 0.5 mg IV Dilaudid q4hprn for Severe Pain and Zofran po/IV for Nausea/Vomiting -IVF Rehydration with NS at 100 mL/hr x 10 hours and then 75 mL/hr x 10 hours now D/C'd -General Surgery Consulted for further evaluation and Management -General Surgery took the patient for cholecystectomy on Friday December 19, 2016 -Cardiology was consulted for Pre-Op Clearance by General Surgery and he is at a moderate risk from a Cardiac Standpoint for Cholecystectomy -General Surgery Recommending current Abx for Therapeutic Course and advancing to Full Liquid Diet as there were no apparent post-operative complications  -Repeat CBC in AM  Acute Cholecystitis  -Appreciated surgical consult,  -Will treat with IV antibiotics and continue Zosyn,  -Await results of blood cultures; Showed NGTD at 3 Day -Keep nothing by mouth and rehydrate. -Per Report patient's Gallbladder was gangrenous and necrotic with focal abscess -As Above  Lactic Acidosis 2/2 to Sepsis -Improved.  -Lactic Acid level went from 2.23 -> 1.9 -> 1.3 -> 1.1 -IVF Rehydration with NS at 100 mL/hr x 10 hours and then 75 mL/hr x 10 hours now D/C'd  CAD (coronary artery disease)  -  Stable,  -Held Home medications while po and will add back slowly  -Per wife has been asymptomatic since original CABG years ago -Cardiology Evaluated and appreciated Recc's    Transient Hypotension but has Hx of HTN, improved -Hold home medications while septic    -Allow permissive hypertension  -Was Bolused with 1 Liter -Most Recent BP was 124/43 -Continue to Monitor  Sinus Bradycardia -Not on any AV Nodal Blocking Agents -Donepezil discontinued -Cardiology feels as if it is likely enhanced by increased vagal tone -May need Atropine at times or a Beta Agonist such as Dopamine -Patient received 1 mg Atropine preeoperatively -ECHOCardiogram showed Ejection fraction was in the range of 55%   to 60%. Wall motion was normal; there were no regional wall   motion abnormalities but there was Grade 2 Diastolic Dysfunction. -Cardiology states that there is no indication for pacemaker at this time as HR is slow but asymptomatic; Recommends avoiding Namenda/Memantine -Cardiology recommends if after illness has syncope/ symptoms with Bradycardia could consider pacer for Palliation   OSA (obstructive sleep apnea) -Patient does not tolerate CPAP  Dementia  -Expect some degree of sundowning,  -Patient altered at baseline but due to infection have been more significantly confused. -Donepezil Held during admission   Mild Hyperbilirubinemia, improved -T Bili went from 1.4 -> 1.3 -> 1.5 -> 0.9 -Likely from Cholecystitis  -Continue to Monitor and repeat CMP in AM  ? AKI -No Prior Baseline -Patient's BUN/Cr went from 26/1.45 -> 22/1.19 -> 26/1.22 -> 29/1.22 -Continue to Monitor and Repeat CMP in AM  Mildly Elevated AST -Likely Reactive to Surgery/Hypotension -Continue to Monitor and Repeat CMP in AM  DVT prophylaxis: SCDs Code Status: FULL CODE Family Communication: No Family present at bedside Disposition Plan: Remain inpatient in SDU as patient is still Bradycardic and if medically stable Transfer to General Medical Floor with Telemetry    Consultants:   General Surgery Dr. Rayvon CharNewman/Dr.Hoxworth  Cardiology Dr. Anne FuSkains   Procedures:   LAPAROSCOPIC CHOLECYSTECTOMY WITH INTRAOPERATIVE CHOLANGIOGRAM (5 port) by Dr. Ezzard StandingNewman on  12/19/16  ECHOCARDIOGRAM Study Conclusions  - Left ventricle: The cavity size was normal. Systolic function was   normal. The estimated ejection fraction was in the range of 55%   to 60%. Wall motion was normal; there were no regional wall   motion abnormalities. Features are consistent with a pseudonormal   left ventricular filling pattern, with concomitant abnormal   relaxation and increased filling pressure (grade 2 diastolic   dysfunction). Doppler parameters are consistent with high   ventricular filling pressure. - Aortic valve: Transvalvular velocity was within the normal range.   There was no stenosis. There was no regurgitation. - Mitral valve: Transvalvular velocity was within the normal range.   There was no evidence for stenosis. There was no regurgitation. - Right ventricle: The cavity size was normal. Wall thickness was   normal. Systolic function was normal. - Tricuspid valve: There was no regurgitation.   Antimicrobials:  Anti-infectives    Start     Dose/Rate Route Frequency Ordered Stop   12/19/16 1201  piperacillin-tazobactam (ZOSYN) 3.375 GM/50ML IVPB    Comments:  Wylene SimmerShepherd, Karen   : cabinet override      12/19/16 1201 12/19/16 1239   12/17/16 2100  piperacillin-tazobactam (ZOSYN) IVPB 3.375 g     3.375 g 12.5 mL/hr over 240 Minutes Intravenous Every 8 hours 12/17/16 1640     12/17/16 1345  piperacillin-tazobactam (ZOSYN) IVPB 3.375 g     3.375 g 100 mL/hr over  30 Minutes Intravenous  Once 12/17/16 1338 12/17/16 1533   12/17/16 1345  vancomycin (VANCOCIN) 1,500 mg in sodium chloride 0.9 % 500 mL IVPB     1,500 mg 250 mL/hr over 120 Minutes Intravenous  Once 12/17/16 1338 12/17/16 1833     Subjective: Seen and examined and was very sleepy this AM and slept through most of the exam. He was arousable but would fall back to sleep. Denied any CP but stated he did have some abdominal pain. No other complaints or concerns at this time.   Objective: Vitals:    12/20/16 0400 12/20/16 0450 12/20/16 0500 12/20/16 0600  BP:    (!) 124/43  Pulse: (!) 41  (!) 39 (!) 35  Resp: 19  18 17   Temp:  98.1 F (36.7 C)    TempSrc:  Oral    SpO2: 97%  95% 95%  Weight:      Height:        Intake/Output Summary (Last 24 hours) at 12/20/16 0739 Last data filed at 12/20/16 0542  Gross per 24 hour  Intake             2305 ml  Output              190 ml  Net             2115 ml   Filed Weights   12/17/16 1437 12/17/16 2235  Weight: 81.6 kg (180 lb) 80.2 kg (176 lb 12.9 oz)   Examination: Physical Exam:  Constitutional: Pleasant elderly Caucasian male who is sleepy but arousable. Not in any distress.  Eyes: Sclerae Anicteric. Lids normal ENMT: Grossly normal hearing. External ears appear normal.  Neck: Supple with no JVD.  Respiratory: Diminished to breath sounds. No wheezing/rales/rhonchi Cardiovascular: Bradycardic Rate. Mild LE Edema Abdomen: Soft. Tender to palpate. Bowel Sounds present Abdominal Wall Dressing appeared C/D/I GU: Deferred Musculoskeletal: No contractures. Hands in mitts Skin: Warm and Dry. No rashes or lesions. JP drain in place Neurologic: CN 2-12 grossly intact. No focal deficits appreciated Psychiatric: Pleasant mood and affect but slightly confused. Somnolent but arousable  Data Reviewed: I have personally reviewed following labs and imaging studies  CBC:  Recent Labs Lab 12/17/16 1154 12/18/16 0327 12/19/16 0026  WBC 12.9* 11.2* 10.1  NEUTROABS 11.7*  --  8.7*  HGB 14.7 12.0* 12.0*  HCT 44.8 37.3* 37.4*  MCV 93.1 93.7 93.5  PLT 159 139* 142*   Basic Metabolic Panel:  Recent Labs Lab 12/17/16 1154 12/17/16 1700 12/18/16 0327 12/19/16 0026 12/20/16 0403  NA 137 139 140 142  --   K 4.8 4.6 4.3 4.1  --   CL 104 106 110 115*  --   CO2 24 24 24 22   --   GLUCOSE 143* 141* 124* 110*  --   BUN 26* 26* 22* 26*  --   CREATININE 1.42* 1.45* 1.19 1.22 1.10  CALCIUM 8.7* 8.0* 7.7* 7.4*  --   MG  --   --  2.1 2.3   --   PHOS  --   --  2.5 3.0  --    GFR: Estimated Creatinine Clearance: 48.7 mL/min (by C-G formula based on SCr of 1.1 mg/dL). Liver Function Tests:  Recent Labs Lab 12/17/16 1154 12/17/16 1700 12/18/16 0327 12/19/16 0026  AST 26 24 21 26   ALT 16* 14* 13* 14*  ALKPHOS 105 86 76 67  BILITOT 1.9* 1.4* 1.3* 1.5*  PROT 7.2 6.4* 5.7* 5.1*  ALBUMIN 3.7  3.1* 2.8* 2.4*    Recent Labs Lab 12/17/16 1154 12/18/16 0327  LIPASE 32 32   No results for input(s): AMMONIA in the last 168 hours. Coagulation Profile:  Recent Labs Lab 12/17/16 2223  INR 1.17   Cardiac Enzymes: No results for input(s): CKTOTAL, CKMB, CKMBINDEX, TROPONINI in the last 168 hours. BNP (last 3 results) No results for input(s): PROBNP in the last 8760 hours. HbA1C: No results for input(s): HGBA1C in the last 72 hours. CBG: No results for input(s): GLUCAP in the last 168 hours. Lipid Profile: No results for input(s): CHOL, HDL, LDLCALC, TRIG, CHOLHDL, LDLDIRECT in the last 72 hours. Thyroid Function Tests:  Recent Labs  12/18/16 0327  TSH 1.394   Anemia Panel: No results for input(s): VITAMINB12, FOLATE, FERRITIN, TIBC, IRON, RETICCTPCT in the last 72 hours. Sepsis Labs:  Recent Labs Lab 12/17/16 2043 12/17/16 2210 12/18/16 0015 12/18/16 2208 12/19/16 0026  PROCALCITON 2.39  --   --   --   --   LATICACIDVEN  --  1.6 1.9 1.3 1.1    Recent Results (from the past 240 hour(s))  Urine culture     Status: None   Collection Time: 12/17/16 11:54 AM  Result Value Ref Range Status   Specimen Description URINE, RANDOM  Final   Special Requests NONE  Final   Culture   Final    NO GROWTH Performed at [facility], [address], [place], [place] [address]    Report Status 12/18/2016 FINAL  Final  Blood Culture (routine x 2)     Status: None (Preliminary result)   Collection Time: 12/17/16 12:00 PM  Result Value Ref Range Status   Specimen Description BLOOD RIGHT WRIST  Final   Special  Requests   Final    BOTTLES DRAWN AEROBIC AND ANAEROBIC Blood Culture adequate volume   Culture   Final    NO GROWTH 2 DAYS Performed at [facility], [address] [place], [place] [address]    Report Status PENDING  Incomplete  Blood Culture (routine x 2)     Status: None (Preliminary result)   Collection Time: 12/17/16  1:20 PM  Result Value Ref Range Status   Specimen Description BLOOD LEFT ANTECUBITAL  Final   Special Requests   Final    BOTTLES DRAWN AEROBIC AND ANAEROBIC Blood Culture adequate volume   Culture   Final    NO GROWTH 2 DAYS Performed at [facility], [address], [place], [place] [address]    Report Status PENDING  Incomplete  MRSA PCR Screening     Status: None   Collection Time: 12/17/16 10:36 PM  Result Value Ref Range Status   MRSA by PCR NEGATIVE NEGATIVE Final    Comment:        The GeneXpert MRSA Assay (FDA approved for NASAL specimens only), is one component of a comprehensive MRSA colonization surveillance program. It is not intended to diagnose MRSA infection nor to guide or monitor treatment for MRSA infections.     Radiology Studies: Dg Cholangiogram Operative  Result Date: 12/19/2016 CLINICAL DATA:  83 year old male with a history of cholelithiasis EXAM: INTRAOPERATIVE CHOLANGIOGRAM TECHNIQUE: Cholangiographic images from the C-arm fluoroscopic device were submitted for interpretation post-operatively. Please see the procedural report for the amount of contrast and the fluoroscopy time utilized. COMPARISON:  None. FINDINGS: Surgical instruments project over the upper abdomen. There is cannulation of the cystic duct/gallbladder neck, with antegrade infusion of contrast. Caliber of  the extrahepatic ductal system unremarkable No filling defect identified. Contrast is not observed to cross the ampulla IMPRESSION: Intraoperative cholangiogram demonstrates extrahepatic biliary ducts of unremarkable caliber, with no large filling defect  identified. Note that contrast was not observed to cross the ampulla. Please refer to the dictated operative report for full details of intraoperative findings and procedure. Electronically Signed   By: Gilmer Mor D.O.   On: 12/19/2016 13:52   Scheduled Meds: . chlorhexidine  15 mL Mouth Rinse BID  . mouth rinse  15 mL Mouth Rinse q12n4p  . sodium chloride flush  3 mL Intravenous Q12H   Continuous Infusions: . piperacillin-tazobactam (ZOSYN)  IV 3.375 g (12/20/16 0539)    LOS: 3 days   Merlene Laughter, DO [facility] Pager [phone]  If 7PM-7AM, please contact night-coverage [email or web address] Password [facility] 12/20/2016, 7:39 AM

## 2016-12-20 NOTE — Progress Notes (Signed)
  Echocardiogram 2D Echocardiogram has been performed.  Travis Wallace Travis Wallace 12/20/2016, 9:01 AM

## 2016-12-20 NOTE — Progress Notes (Signed)
Progress Note  Patient Name: Travis Wallace Date of Encounter: 12/20/2016  Primary Cardiologist: Anne Fu New  Subjective   No Cp, no SOB - sleepy now but appears comfortable  Inpatient Medications    Scheduled Meds: . chlorhexidine  15 mL Mouth Rinse BID  . mouth rinse  15 mL Mouth Rinse q12n4p  . sodium chloride flush  3 mL Intravenous Q12H   Continuous Infusions: . piperacillin-tazobactam (ZOSYN)  IV 3.375 g (12/20/16 0539)   PRN Meds: acetaminophen **OR** acetaminophen, HYDROmorphone (DILAUDID) injection, LORazepam, ondansetron **OR** ondansetron (ZOFRAN) IV, traMADol   Vital Signs    Vitals:   12/20/16 0400 12/20/16 0450 12/20/16 0500 12/20/16 0600  BP:    (!) 124/43  Pulse: (!) 41  (!) 39 (!) 35  Resp: 19  18 17   Temp:  98.1 F (36.7 C)    TempSrc:  Oral    SpO2: 97%  95% 95%  Weight:      Height:        Intake/Output Summary (Last 24 hours) at 12/20/16 0749 Last data filed at 12/20/16 0542  Gross per 24 hour  Intake             2305 ml  Output              190 ml  Net             2115 ml   Filed Weights   12/17/16 1437 12/17/16 2235  Weight: 180 lb (81.6 kg) 176 lb 12.9 oz (80.2 kg)    Telemetry    Sinus brady at times in 30's - Personally Reviewed  ECG    SB - Personally Reviewed  Physical Exam   GEN: No acute distress.   Neck: No JVD Cardiac: Huston Foley reg, no murmurs, rubs, or gallops.  Respiratory: Clear to auscultation bilaterally. GI: Soft, nontender, non-distended - post op MS: No edema; No deformity. Neuro:  Nonfocal  Psych: dementia   Labs    Chemistry Recent Labs Lab 12/17/16 1700 12/18/16 0327 12/19/16 0026 12/20/16 0403  NA 139 140 142  --   K 4.6 4.3 4.1  --   CL 106 110 115*  --   CO2 24 24 22   --   GLUCOSE 141* 124* 110*  --   BUN 26* 22* 26*  --   CREATININE 1.45* 1.19 1.22 1.10  CALCIUM 8.0* 7.7* 7.4*  --   PROT 6.4* 5.7* 5.1*  --   ALBUMIN 3.1* 2.8* 2.4*  --   AST 24 21 26   --   ALT 14* 13* 14*  --   ALKPHOS  86 76 67  --   BILITOT 1.4* 1.3* 1.5*  --   GFRNONAA 43* 55* 53* >60  GFRAA 50* >60 >60 >60  ANIONGAP 9 6 5   --      Hematology Recent Labs Lab 12/17/16 1154 12/18/16 0327 12/19/16 0026  WBC 12.9* 11.2* 10.1  RBC 4.81 3.98* 4.00*  HGB 14.7 12.0* 12.0*  HCT 44.8 37.3* 37.4*  MCV 93.1 93.7 93.5  MCH 30.6 30.2 30.0  MCHC 32.8 32.2 32.1  RDW 13.5 13.4 13.2  PLT 159 139* 142*    Cardiac EnzymesNo results for input(s): TROPONINI in the last 168 hours. No results for input(s): TROPIPOC in the last 168 hours.   BNPNo results for input(s): BNP, PROBNP in the last 168 hours.   DDimer No results for input(s): DDIMER in the last 168 hours.   Radiology    Dg Cholangiogram  Operative  Result Date: 12/19/2016 CLINICAL DATA:  83 year old male with a history of cholelithiasis EXAM: INTRAOPERATIVE CHOLANGIOGRAM TECHNIQUE: Cholangiographic images from the C-arm fluoroscopic device were submitted for interpretation post-operatively. Please see the procedural report for the amount of contrast and the fluoroscopy time utilized. COMPARISON:  None. FINDINGS: Surgical instruments project over the upper abdomen. There is cannulation of the cystic duct/gallbladder neck, with antegrade infusion of contrast. Caliber of the extrahepatic ductal system unremarkable No filling defect identified. Contrast is not observed to cross the ampulla IMPRESSION: Intraoperative cholangiogram demonstrates extrahepatic biliary ducts of unremarkable caliber, with no large filling defect identified. Note that contrast was not observed to cross the ampulla. Please refer to the dictated operative report for full details of intraoperative findings and procedure. Electronically Signed   By: Gilmer MorJaime  Wagner D.O.   On: 12/19/2016 13:52    Cardiac Studies   ECG - SB with PAC  Patient Profile     83 y.o. male with CAD post CABG with dementia, bradycardia (sinus with normal PR), necrotic gall bladder s/p  cholecystectomy  Assessment & Plan     - HR slow but asymptomatic  - no indication for pacemaker at this time  - Would avoid Namenda/memantine (can slow rate)  - If after this illness has syncope/ symptoms with brady, could consider pacer for palliation. For now no indication.   Signed, Donato SchultzMark Skains, MD  12/20/2016, 7:49 AM

## 2016-12-20 NOTE — Progress Notes (Signed)
Patient ID: Travis MoloneyJohn Wallace, male   DOB: 06/07/1931, 83 y.o.   MRN: [id number] 1 Day Post-Op   Subjective: Denies abdominal pain. Would like something to eat.  Objective: Vital signs in last 24 hours: Temp:  [96.8 F (36 C)-98.9 F (37.2 C)] 98.1 F (36.7 C) (06/23 0450) Pulse Rate:  [35-102] 35 (06/23 0600) Resp:  [0-26] 17 (06/23 0600) BP: (99-155)/(27-53) 124/43 (06/23 0600) SpO2:  [90 %-97 %] 95 % (06/23 0600)    Intake/Output from previous day: 06/22 0701 - 06/23 0700 In: 2305 [P.O.:60; I.V.:2015; IV Piggyback:150] Out: 190 [Drains:115; Blood:75] Intake/Output this shift: No intake/output data recorded.  General appearance: alert, cooperative and Pleasant, not oriented to place or situation Resp: clear to auscultation bilaterally Cardio: Regular bradycardia, appears unchanged GI: Mild distention. Soft and without tenderness. Incision/Wound: Incisions clean and dry. JP nonbilious.  Lab Results:   Recent Labs  12/18/16 0327 12/19/16 0026  WBC 11.2* 10.1  HGB 12.0* 12.0*  HCT 37.3* 37.4*  PLT 139* 142*   BMET  Recent Labs  12/18/16 0327 12/19/16 0026 12/20/16 0403  NA 140 142  --   K 4.3 4.1  --   CL 110 115*  --   CO2 24 22  --   GLUCOSE 124* 110*  --   BUN 22* 26*  --   CREATININE 1.19 1.22 1.10  CALCIUM 7.7* 7.4*  --      Studies/Results: Dg Cholangiogram Operative  Result Date: 12/19/2016 CLINICAL DATA:  83 year old male with a history of cholelithiasis EXAM: INTRAOPERATIVE CHOLANGIOGRAM TECHNIQUE: Cholangiographic images from the C-arm fluoroscopic device were submitted for interpretation post-operatively. Please see the procedural report for the amount of contrast and the fluoroscopy time utilized. COMPARISON:  None. FINDINGS: Surgical instruments project over the upper abdomen. There is cannulation of the cystic duct/gallbladder neck, with antegrade infusion of contrast. Caliber of the extrahepatic ductal system unremarkable No filling defect  identified. Contrast is not observed to cross the ampulla IMPRESSION: Intraoperative cholangiogram demonstrates extrahepatic biliary ducts of unremarkable caliber, with no large filling defect identified. Note that contrast was not observed to cross the ampulla. Please refer to the dictated operative report for full details of intraoperative findings and procedure. Electronically Signed   By: Gilmer MorJaime  Wagner D.O.   On: 12/19/2016 13:52    Anti-infectives: Anti-infectives    Start     Dose/Rate Route Frequency Ordered Stop   12/19/16 1201  piperacillin-tazobactam (ZOSYN) 3.375 GM/50ML IVPB    Comments:  Wylene SimmerShepherd, Karen   : cabinet override      12/19/16 1201 12/19/16 1239   12/17/16 2100  piperacillin-tazobactam (ZOSYN) IVPB 3.375 g     3.375 g 12.5 mL/hr over 240 Minutes Intravenous Every 8 hours 12/17/16 1640     12/17/16 1345  piperacillin-tazobactam (ZOSYN) IVPB 3.375 g     3.375 g 100 mL/hr over 30 Minutes Intravenous  Once 12/17/16 1338 12/17/16 1533   12/17/16 1345  vancomycin (VANCOCIN) 1,500 mg in sodium chloride 0.9 % 500 mL IVPB     1,500 mg 250 mL/hr over 120 Minutes Intravenous  Once 12/17/16 1338 12/17/16 1833      Assessment/Plan: s/p Procedure(s): LAPAROSCOPIC CHOLECYSTECTOMY WITH INTRAOPERATIVE CHOLANGIOGRAM No apparent postoperative complications. He had a necrotic gallbladder. Would continue antibiotics for therapeutic course. Advance to full liquid diet.   LOS: 3 days    Bryceson Grape T 12/20/2016

## 2016-12-21 ENCOUNTER — Encounter (HOSPITAL_COMMUNITY): Payer: Self-pay | Admitting: Surgery

## 2016-12-21 DIAGNOSIS — G2581 Restless legs syndrome: Secondary | ICD-10-CM

## 2016-12-21 DIAGNOSIS — K219 Gastro-esophageal reflux disease without esophagitis: Secondary | ICD-10-CM

## 2016-12-21 DIAGNOSIS — G309 Alzheimer's disease, unspecified: Secondary | ICD-10-CM

## 2016-12-21 DIAGNOSIS — R410 Disorientation, unspecified: Secondary | ICD-10-CM

## 2016-12-21 DIAGNOSIS — K651 Peritoneal abscess: Secondary | ICD-10-CM

## 2016-12-21 DIAGNOSIS — K830 Cholangitis: Secondary | ICD-10-CM

## 2016-12-21 DIAGNOSIS — F0280 Dementia in other diseases classified elsewhere without behavioral disturbance: Secondary | ICD-10-CM | POA: Diagnosis present

## 2016-12-21 DIAGNOSIS — I10 Essential (primary) hypertension: Secondary | ICD-10-CM | POA: Diagnosis present

## 2016-12-21 DIAGNOSIS — G4700 Insomnia, unspecified: Secondary | ICD-10-CM

## 2016-12-21 DIAGNOSIS — G4730 Sleep apnea, unspecified: Secondary | ICD-10-CM

## 2016-12-21 LAB — CBC WITH DIFFERENTIAL/PLATELET
BASOS ABS: 0 10*3/uL (ref 0.0–0.1)
BASOS PCT: 0 %
Eosinophils Absolute: 0 10*3/uL (ref 0.0–0.7)
Eosinophils Relative: 0 %
HEMATOCRIT: 35.9 % — AB (ref 39.0–52.0)
Hemoglobin: 11.7 g/dL — ABNORMAL LOW (ref 13.0–17.0)
Lymphocytes Relative: 7 %
Lymphs Abs: 0.7 10*3/uL (ref 0.7–4.0)
MCH: 30.1 pg (ref 26.0–34.0)
MCHC: 32.6 g/dL (ref 30.0–36.0)
MCV: 92.3 fL (ref 78.0–100.0)
MONO ABS: 0.6 10*3/uL (ref 0.1–1.0)
Monocytes Relative: 6 %
NEUTROS ABS: 9.2 10*3/uL — AB (ref 1.7–7.7)
Neutrophils Relative %: 87 %
PLATELETS: 228 10*3/uL (ref 150–400)
RBC: 3.89 MIL/uL — ABNORMAL LOW (ref 4.22–5.81)
RDW: 13.3 % (ref 11.5–15.5)
WBC: 10.5 10*3/uL (ref 4.0–10.5)

## 2016-12-21 LAB — COMPREHENSIVE METABOLIC PANEL
ALBUMIN: 2.3 g/dL — AB (ref 3.5–5.0)
ALT: 34 U/L (ref 17–63)
AST: 36 U/L (ref 15–41)
Alkaline Phosphatase: 58 U/L (ref 38–126)
Anion gap: 6 (ref 5–15)
BILIRUBIN TOTAL: 0.7 mg/dL (ref 0.3–1.2)
BUN: 29 mg/dL — ABNORMAL HIGH (ref 6–20)
CHLORIDE: 114 mmol/L — AB (ref 101–111)
CO2: 25 mmol/L (ref 22–32)
Calcium: 8 mg/dL — ABNORMAL LOW (ref 8.9–10.3)
Creatinine, Ser: 1.03 mg/dL (ref 0.61–1.24)
GFR calc Af Amer: >60 mL/min (ref >60)
GFR calc non Af Amer: >60 mL/min (ref >60)
GLUCOSE: 128 mg/dL — AB (ref 65–99)
POTASSIUM: 3.7 mmol/L (ref 3.5–5.1)
Sodium: 145 mmol/L (ref 135–145)
Total Protein: 5.5 g/dL — ABNORMAL LOW (ref 6.5–8.1)

## 2016-12-21 LAB — MAGNESIUM: Magnesium: 2.4 mg/dL (ref 1.7–2.4)

## 2016-12-21 LAB — PHOSPHORUS: Phosphorus: 2.4 mg/dL — ABNORMAL LOW (ref 2.5–4.6)

## 2016-12-21 MED ORDER — PROCHLORPERAZINE EDISYLATE 5 MG/ML IJ SOLN: 5.0000 mg | INTRAMUSCULAR | Status: DC | PRN

## 2016-12-21 MED ORDER — AMLODIPINE BESYLATE 5 MG PO TABS
5.0000 mg | ORAL_TABLET | Freq: Every day | ORAL | Status: DC
  Administered 2016-12-21 – 2016-12-25 (×5): 5 mg via ORAL
  Filled 2016-12-21 (×5): qty 1

## 2016-12-21 MED ORDER — SODIUM CHLORIDE 0.9% FLUSH
3.0000 mL | Freq: Two times a day (BID) | INTRAVENOUS | Status: DC
  Administered 2016-12-22 – 2016-12-24 (×3): 3 mL via INTRAVENOUS

## 2016-12-21 MED ORDER — ALUM & MAG HYDROXIDE-SIMETH 200-200-20 MG/5ML PO SUSP: 30.0000 mL | Freq: Four times a day (QID) | ORAL | Status: DC | PRN

## 2016-12-21 MED ORDER — HYDROCORTISONE 1 % EX CREA: 1.0000 "application " | TOPICAL_CREAM | Freq: Three times a day (TID) | CUTANEOUS | Status: DC | PRN

## 2016-12-21 MED ORDER — DOXAZOSIN MESYLATE 4 MG PO TABS
4.0000 mg | ORAL_TABLET | Freq: Every day | ORAL | Status: DC
  Administered 2016-12-21 – 2016-12-25 (×5): 4 mg via ORAL
  Filled 2016-12-21: qty 4
  Filled 2016-12-21 (×4): qty 1

## 2016-12-21 MED ORDER — ATORVASTATIN CALCIUM 20 MG PO TABS
20.0000 mg | ORAL_TABLET | Freq: Every day | ORAL | Status: DC
  Administered 2016-12-21 – 2016-12-24 (×4): 20 mg via ORAL
  Filled 2016-12-21 (×4): qty 1

## 2016-12-21 MED ORDER — LACTULOSE 10 GM/15ML PO SOLN
20.0000 g | Freq: Every day | ORAL | Status: DC
  Administered 2016-12-21 – 2016-12-25 (×3): 20 g via ORAL
  Filled 2016-12-21 (×4): qty 30

## 2016-12-21 MED ORDER — DEXTROSE 5 % IV SOLN
1000.0000 mg | Freq: Four times a day (QID) | INTRAVENOUS | Status: DC | PRN
  Filled 2016-12-21: qty 10

## 2016-12-21 MED ORDER — SODIUM CHLORIDE 0.9% FLUSH: 3.0000 mL | INTRAVENOUS | Status: DC | PRN

## 2016-12-21 MED ORDER — MAGNESIUM HYDROXIDE 400 MG/5ML PO SUSP: 30.0000 mL | Freq: Two times a day (BID) | ORAL | Status: DC | PRN

## 2016-12-21 MED ORDER — AMLODIPINE BESY-BENAZEPRIL HCL 5-20 MG PO CAPS: 1.0000 | ORAL_CAPSULE | Freq: Every day | ORAL | Status: DC

## 2016-12-21 MED ORDER — HYDROCORTISONE 2.5 % RE CREA: 1.0000 "application " | TOPICAL_CREAM | Freq: Four times a day (QID) | RECTAL | Status: DC | PRN

## 2016-12-21 MED ORDER — PSYLLIUM 95 % PO PACK
1.0000 | PACK | Freq: Every day | ORAL | Status: DC
  Administered 2016-12-21 – 2016-12-25 (×4): 1 via ORAL
  Filled 2016-12-21 (×5): qty 1

## 2016-12-21 MED ORDER — DONEPEZIL HCL 10 MG PO TABS
10.0000 mg | ORAL_TABLET | Freq: Every day | ORAL | Status: DC
  Administered 2016-12-21 – 2016-12-24 (×4): 10 mg via ORAL
  Filled 2016-12-21 (×4): qty 1

## 2016-12-21 MED ORDER — PHENOL 1.4 % MT LIQD: 1.0000 | OROMUCOSAL | Status: DC | PRN

## 2016-12-21 MED ORDER — MAGNESIUM GLUCONATE 500 MG PO TABS
500.0000 mg | ORAL_TABLET | Freq: Every day | ORAL | Status: DC
  Administered 2016-12-21 – 2016-12-25 (×5): 500 mg via ORAL
  Filled 2016-12-21 (×5): qty 1

## 2016-12-21 MED ORDER — GUAIFENESIN-DM 100-10 MG/5ML PO SYRP: 10.0000 mL | ORAL_SOLUTION | ORAL | Status: DC | PRN

## 2016-12-21 MED ORDER — LIP MEDEX EX OINT
1.0000 "application " | TOPICAL_OINTMENT | Freq: Two times a day (BID) | CUTANEOUS | Status: DC
  Administered 2016-12-21 – 2016-12-24 (×8): 1 via TOPICAL
  Filled 2016-12-21 (×2): qty 7

## 2016-12-21 MED ORDER — SACCHAROMYCES BOULARDII 250 MG PO CAPS
250.0000 mg | ORAL_CAPSULE | Freq: Two times a day (BID) | ORAL | Status: DC
  Administered 2016-12-21 – 2016-12-25 (×8): 250 mg via ORAL
  Filled 2016-12-21 (×8): qty 1

## 2016-12-21 MED ORDER — PANTOPRAZOLE SODIUM 40 MG PO TBEC
40.0000 mg | DELAYED_RELEASE_TABLET | Freq: Every day | ORAL | Status: DC
  Administered 2016-12-21 – 2016-12-25 (×5): 40 mg via ORAL
  Filled 2016-12-21 (×5): qty 1

## 2016-12-21 MED ORDER — TRAMADOL HCL 50 MG PO TABS: 50.0000 mg | ORAL_TABLET | Freq: Two times a day (BID) | ORAL | Status: DC

## 2016-12-21 MED ORDER — ACETAMINOPHEN 500 MG PO TABS
1000.0000 mg | ORAL_TABLET | Freq: Three times a day (TID) | ORAL | Status: DC
  Administered 2016-12-21 – 2016-12-25 (×9): 1000 mg via ORAL
  Filled 2016-12-21 (×11): qty 2

## 2016-12-21 MED ORDER — ENSURE SURGERY PO LIQD
237.0000 mL | Freq: Two times a day (BID) | ORAL | Status: DC
Start: 2016-12-21 — End: 2016-12-25
  Administered 2016-12-21 – 2016-12-24 (×4): 237 mL via ORAL
  Filled 2016-12-21 (×10): qty 237

## 2016-12-21 MED ORDER — FUROSEMIDE 10 MG/ML IJ SOLN
40.0000 mg | Freq: Once | INTRAMUSCULAR | Status: AC
  Administered 2016-12-21: 40 mg via INTRAVENOUS
  Filled 2016-12-21: qty 4

## 2016-12-21 MED ORDER — LACTATED RINGERS IV BOLUS (SEPSIS): 1000.0000 mL | Freq: Three times a day (TID) | INTRAVENOUS | Status: AC | PRN

## 2016-12-21 MED ORDER — QUETIAPINE FUMARATE 50 MG PO TABS
75.0000 mg | ORAL_TABLET | Freq: Every day | ORAL | Status: DC
  Administered 2016-12-21 – 2016-12-24 (×4): 75 mg via ORAL
  Filled 2016-12-21 (×4): qty 1

## 2016-12-21 MED ORDER — SODIUM CHLORIDE 0.9 % IV SOLN: 250.0000 mL | INTRAVENOUS | Status: DC | PRN

## 2016-12-21 MED ORDER — ROPINIROLE HCL 1 MG PO TABS
1.0000 mg | ORAL_TABLET | Freq: Every day | ORAL | Status: DC
  Administered 2016-12-21 – 2016-12-24 (×4): 1 mg via ORAL
  Filled 2016-12-21 (×4): qty 1

## 2016-12-21 MED ORDER — MEMANTINE HCL-DONEPEZIL HCL ER 28-10 MG PO CP24: 1.0000 | ORAL_CAPSULE | Freq: Every day | ORAL | Status: DC

## 2016-12-21 MED ORDER — BENAZEPRIL HCL 20 MG PO TABS
20.0000 mg | ORAL_TABLET | Freq: Every day | ORAL | Status: DC
Start: 2016-12-21 — End: 2016-12-25
  Administered 2016-12-21 – 2016-12-25 (×5): 20 mg via ORAL
  Filled 2016-12-21 (×5): qty 1

## 2016-12-21 MED ORDER — LORATADINE 10 MG PO TABS
10.0000 mg | ORAL_TABLET | Freq: Every day | ORAL | Status: DC
  Administered 2016-12-21 – 2016-12-25 (×5): 10 mg via ORAL
  Filled 2016-12-21 (×5): qty 1

## 2016-12-21 MED ORDER — ASPIRIN EC 81 MG PO TBEC
81.0000 mg | DELAYED_RELEASE_TABLET | Freq: Every day | ORAL | Status: DC
  Administered 2016-12-21 – 2016-12-25 (×5): 81 mg via ORAL
  Filled 2016-12-21 (×5): qty 1

## 2016-12-21 MED ORDER — BISACODYL 10 MG RE SUPP: 10.0000 mg | Freq: Two times a day (BID) | RECTAL | Status: DC | PRN

## 2016-12-21 MED ORDER — NIACIN 500 MG PO TABS
500.0000 mg | ORAL_TABLET | Freq: Every day | ORAL | Status: DC
  Administered 2016-12-21 – 2016-12-25 (×5): 500 mg via ORAL
  Filled 2016-12-21 (×5): qty 1

## 2016-12-21 MED ORDER — MAGIC MOUTHWASH
15.0000 mL | Freq: Four times a day (QID) | ORAL | Status: DC | PRN
  Filled 2016-12-21: qty 15

## 2016-12-21 MED ORDER — TRAMADOL HCL 50 MG PO TABS: 50.0000 mg | ORAL_TABLET | Freq: Four times a day (QID) | ORAL | Status: DC | PRN

## 2016-12-21 MED ORDER — MENTHOL 3 MG MT LOZG
1.0000 | LOZENGE | OROMUCOSAL | Status: DC | PRN
  Filled 2016-12-21: qty 9

## 2016-12-21 NOTE — Progress Notes (Signed)
Report called and given to [facility], LLCheyla, rn. All nurse's questions answered to her satisfaction.  Derinda SisVera Jiovanna Frei,rn.

## 2016-12-21 NOTE — Progress Notes (Signed)
PROGRESS NOTE    Travis Wallace  GNF:[id number] DOB: 01/22/1931 DOA: 12/17/2016 PCP: Doreen SalvageBulla, [name], PA-C   Brief Narrative:  Travis Wallace is a 83 y.o. male with medical history significant of CAD,  HTN, HLD, dementia and other comorbids who presented with 1-2 day history of worsening confusion and lethargy. He has had fevers but unable to provide detailed history. He was evaluated 5 days ago for Chest Pain and was found to have gallbladder sludge but no active [facility]stit[facility] that [facility]. [facility] and lethargy was worked up and found to be septic 2/2 to Acute Cholecystitis. General Surgery Consulted and are planning on taking the patient for Surgery later today (12/19/16). Patient became bradycardic on the night of 12/17/16-6/21/18so Cardiology was consulted by General Surgery prior to his surgery for Cardiac Clearance. Patient was cleared and taken to surgery yesterday and found to have a gangrenous cholecystitis with focal abscess and cholelithiasis. He is improving but remained Bradycardic so an ECHOCardiogram was done 12/20/16. Patient was confused this AM but not complaining of any symptoms.  Assessment & Plan:   Active Problems:   Acute gangrenous cholecystitis s/p lap cholecystectomy 12/19/2016   Cholangitis   Severe sepsis (HCC)   CAD (coronary artery disease)   HTN (hypertension)   OSA (obstructive sleep apnea)   Sepsis (HCC)   Dementia   Altered mental status   Sinus bradycardia   Hyperbilirubinemia   AKI (acute kidney injury) (HCC)   Pericholecystic abscess s/p OR drainage 12/19/2016   Delirium   Alzheimer's dementia   Hypertension   GERD (gastroesophageal reflux disease)   Sleep apnea   Restless leg   Insomnia  Severe sepsis (HCC) from Acute Gangrenous Cholecystitis with Focal Abscess and Cholelithiasis POD 2, improving  -Admitted per Sepsis protocol likely source being  intra-abdominal infection -Sepsis Physiology  improving -Rehydrated with 7830ml/kg -Initiated Broad spectrum antibiotics with IV Zosyn and will continue per General Surgery Recommendations for 5 days after surgery (3 days Left) -Obtained Blood Cultures and showed NGTD at 4 days -Lactic Acid level went from 2.23 -> 1.9 -> 1.3 -> 1.1 -Obtained procalcitonin level and was 2.39 -CT Abdomen and Pelvis shows Gallbladder is distended to 4 cm. There is pericholecystic inflammation in the fat adjacent to the gallbladder. There is a calculus in the neck of the gallbladder measuring 10 mm. Small amount pericholecystic fluid. The distal common bile duct is upper limits normal at 6 mm. No common bile duct stone identified by CT. Findings were consistent with acute cholecystitis.  -WBC went from 11.2 -> 10.1 -> 8.8 -> 10.5 -Pain Control with 0.5 mg IV Dilaudid q4hprn for Severe Pain and Zofran po/IV for Nausea/Vomiting -IVF Rehydration now D/C'd -General Surgery Consulted for further evaluation and Management -General Surgery took the patient for cholecystectomy on Friday December 19, 2016 -Cardiology was consulted for Pre-Op Clearance by General Surgery and he is at a moderate risk from a Cardiac Standpoint for Cholecystectomy -General Surgery Recommending current Abx for Therapeutic Course and advancing Full Liquid Diet to a Soft Diet as there were no apparent post-operative complications  -C/w JP Drain and Care -Repeat CBC in AM  Acute Cholecystitis  -Appreciated Surgical consult,  -Will treat with IV antibiotics and continue Zosyn,  -Await results of blood cultures; Showed NGTD at 3 Day -Keep nothing by mouth and rehydrate. -Per Report patient's Gallbladder was gangrenous and necrotic with focal abscess -As Above  Lactic Acidosis 2/2 to Sepsis -  Improved.  -Lactic Acid level went from 2.23 -> 1.9 -> 1.3 -> 1.1 -IVF Rehydration with NS now D/C'd  CAD (coronary artery disease)  -Stable,  -Held Home medications for surgery and NPO but now will be  restarted -General Surgery Restarted ASA 81 mg po Daily, Atorvastatin 20 mg po QHS -Per wife has been asymptomatic since original CABG years ago -Cardiology Evaluated and appreciated Recc's    Transient Hypotension; Hypertension -Hypotension has resolved and BP was 155/95 -Held home medications while septic -Patient's Amlodipine 5 mg po Daily and Benazepril 20 mg po Daily restarted -C/w Doxazosin 4 mg po Daily -Continue to Monitor  Sinus Bradycardia -Not on any AV Nodal Blocking Agents -Donepezil 10 mg po qHS restarted by General Surgery -Cardiology feels as if it is likely enhanced by increased vagal tone -May need Atropine at times or a Beta Agonist such as Dopamine -Patient received 1 mg Atropine preeoperatively -ECHOCardiogram showed Ejection fraction was in the range of 55%   to 60%. Wall motion was normal; there were no regional wall   motion abnormalities but there was Grade 2 Diastolic Dysfunction. -Cardiology states that there is no indication for pacemaker at this time as HR is slow but asymptomatic; Recommends avoiding Namenda/Memantine -Cardiology recommends if after illness has syncope/ symptoms with Bradycardia could consider pacer for Palliation   OSA (obstructive sleep apnea) -Patient does not tolerate CPAP  Dementia  -Expect some degree of sundowning, On Sundowning precautions -Patient altered at baseline but due to infection have been more significantly confused. -Donepezil Held during admission and restarted by General Surgery today -Restarted Quetiapine 75 mg po Daily qhS and an additional 25 mg po Daily prn for restlessness   Mild Hyperbilirubinemia, improved -T Bili went from 1.4 -> 1.3 -> 1.5 -> 0.9 -> 0.7 -Likely from Cholecystitis  -Continue to Monitor and repeat CMP in AM  ? AKI -No Prior Baseline -Patient's BUN/Cr went from 26/1.45 -> 22/1.19 -> 26/1.22 -> 29/1.22 -> 29/1.03 -Patient received one dose of IV Lasix 40 mg by General  Surgery -Benazepril 20 mg po Daily restarted -Continue to Monitor and Repeat CMP in AM  Mildly Elevated AST, improved -AST went from 45 -> 36 -Likely Reactive to Surgery/Hypotension -Continue to Monitor and Repeat CMP in AM  GERD -C/w Pantoprazole 40 mg po Daily  Restless Leg Syndrome -C/w Ropinirole 1 mg po qHS  HLD/Dyslipidemia -C/w Atorvastatin 20 mg po Daily  DVT prophylaxis: SCDs Code Status: FULL CODE Family Communication: No Family present at bedside Disposition Plan: Transfer to Medical Floor with Telemetry    Consultants:   General Surgery Dr. Rayvon Char  Cardiology Dr. Anne Fu   Procedures:   LAPAROSCOPIC CHOLECYSTECTOMY WITH INTRAOPERATIVE CHOLANGIOGRAM (5 port) by Dr. Ezzard Standing on 12/19/16  ECHOCARDIOGRAM Study Conclusions  - Left ventricle: The cavity size was normal. Systolic function was   normal. The estimated ejection fraction was in the range of 55%   to 60%. Wall motion was normal; there were no regional wall   motion abnormalities. Features are consistent with a pseudonormal   left ventricular filling pattern, with concomitant abnormal   relaxation and increased filling pressure (grade 2 diastolic   dysfunction). Doppler parameters are consistent with high   ventricular filling pressure. - Aortic valve: Transvalvular velocity was within the normal range.   There was no stenosis. There was no regurgitation. - Mitral valve: Transvalvular velocity was within the normal range.   There was no evidence for stenosis. There was no regurgitation. - Right ventricle: The  cavity size was normal. Wall thickness was   normal. Systolic function was normal. - Tricuspid valve: There was no regurgitation.   Antimicrobials:  Anti-infectives    Start     Dose/Rate Route Frequency Ordered Stop   12/19/16 1201  piperacillin-tazobactam (ZOSYN) 3.375 GM/50ML IVPB    Comments:  Wylene Simmer   : cabinet override      12/19/16 1201 12/19/16 1239   12/17/16  2100  piperacillin-tazobactam (ZOSYN) IVPB 3.375 g     3.375 g 12.5 mL/hr over 240 Minutes Intravenous Every 8 hours 12/17/16 1640 12/24/16 0802   12/17/16 1345  piperacillin-tazobactam (ZOSYN) IVPB 3.375 g     3.375 g 100 mL/hr over 30 Minutes Intravenous  Once 12/17/16 1338 12/17/16 1533   12/17/16 1345  vancomycin (VANCOCIN) 1,500 mg in sodium chloride 0.9 % 500 mL IVPB     1,500 mg 250 mL/hr over 120 Minutes Intravenous  Once 12/17/16 1338 12/17/16 1833     Subjective: Seen and examined and was awake and confused. He was fidgeting and trying to pull on his IV. No nausea or vomiting. No other complaints and was able to tell me his full name.   Objective: Vitals:   12/21/16 0300 12/21/16 0400 12/21/16 0505 12/21/16 0800  BP: (!) 155/95     Pulse: 87 (!) 37    Resp: (!) 21 19    Temp:   98.3 F (36.8 C) 98.3 F (36.8 C)  TempSrc:   Oral Oral  SpO2: (!) 85% 98%    Weight: 81.8 kg (180 lb 5.4 oz)     Height:        Intake/Output Summary (Last 24 hours) at 12/21/16 1229 Last data filed at 12/21/16 0402  Gross per 24 hour  Intake              750 ml  Output              650 ml  Net              100 ml   Filed Weights   12/17/16 1437 12/17/16 2235 12/21/16 0300  Weight: 81.6 kg (180 lb) 80.2 kg (176 lb 12.9 oz) 81.8 kg (180 lb 5.4 oz)   Examination: Physical Exam:  Constitutional: Pleasantly confused elderly 83 yo Caucasian male in NAD Eyes: Sclerae anicteric. Lids normal ENMT: Grossly normal hearing. Mucous membranes appear moist Neck: Supple with no JVD Respiratory: Diminished with no wheezing/rales/rhonchi. Patient was not tachypenic or using any accessory muscles to breathe Cardiovascular: Bradycardic Rate but Sinus. Mild LE edema Abdomen: Soft, Mildly tender. Abdominal Binder in place with JP drain under paint. Did not seem distended GU: Deferred. Patient wearing a condom catheter Musculoskeletal: No contractures. Hands in mitts Skin: Warm and dry. No rashes or  lesions on a limited skin evaluation Neurologic: CN 2-12 grossly in tact. No focal deficits appreciated Psychiatric: Pleasantly confused and demented. Oriented to self. Impaired judgement and insight.   Data Reviewed: I have personally reviewed following labs and imaging studies  CBC:  Recent Labs Lab 12/17/16 1154 12/18/16 0327 12/19/16 0026 12/20/16 0951 12/21/16 0359  WBC 12.9* 11.2* 10.1 8.8 10.5  NEUTROABS 11.7*  --  8.7* 7.9* 9.2*  HGB 14.7 12.0* 12.0* 11.6* 11.7*  HCT 44.8 37.3* 37.4* 35.3* 35.9*  MCV 93.1 93.7 93.5 92.4 92.3  PLT 159 139* 142* 189 228   Basic Metabolic Panel:  Recent Labs Lab 12/17/16 1700 12/18/16 0327 12/19/16 0026 12/20/16 0403 12/20/16 1610 12/21/16 9604  NA 139 140 142  --  144 145  K 4.6 4.3 4.1  --  4.4 3.7  CL 106 110 115*  --  116* 114*  CO2 24 24 22   --  22 25  GLUCOSE 141* 124* 110*  --  152* 128*  BUN 26* 22* 26*  --  29* 29*  CREATININE 1.45* 1.19 1.22 1.10 1.20 1.03  CALCIUM 8.0* 7.7* 7.4*  --  7.8* 8.0*  MG  --  2.1 2.3  --  2.6* 2.4  PHOS  --  2.5 3.0  --  2.9 2.4*   GFR: Estimated Creatinine Clearance: 52.4 mL/min (by C-G formula based on SCr of 1.03 mg/dL). Liver Function Tests:  Recent Labs Lab 12/17/16 1700 12/18/16 0327 12/19/16 0026 12/20/16 0951 12/21/16 0702  AST 24 21 26  45* 36  ALT 14* 13* 14* 33 34  ALKPHOS 86 76 67 60 58  BILITOT 1.4* 1.3* 1.5* 0.9 0.7  PROT 6.4* 5.7* 5.1* 5.6* 5.5*  ALBUMIN 3.1* 2.8* 2.4* 2.2* 2.3*    Recent Labs Lab 12/17/16 1154 12/18/16 0327  LIPASE 32 32   No results for input(s): AMMONIA in the last 168 hours. Coagulation Profile:  Recent Labs Lab 12/17/16 2223  INR 1.17   Cardiac Enzymes: No results for input(s): CKTOTAL, CKMB, CKMBINDEX, TROPONINI in the last 168 hours. BNP (last 3 results) No results for input(s): PROBNP in the last 8760 hours. HbA1C: No results for input(s): HGBA1C in the last 72 hours. CBG: No results for input(s): GLUCAP in the last 168  hours. Lipid Profile: No results for input(s): CHOL, HDL, LDLCALC, TRIG, CHOLHDL, LDLDIRECT in the last 72 hours. Thyroid Function Tests: No results for input(s): TSH, T4TOTAL, FREET4, T3FREE, THYROIDAB in the last 72 hours. Anemia Panel: No results for input(s): VITAMINB12, FOLATE, FERRITIN, TIBC, IRON, RETICCTPCT in the last 72 hours. Sepsis Labs:  Recent Labs Lab 12/17/16 2043 12/17/16 2210 12/18/16 0015 12/18/16 2208 12/19/16 0026  PROCALCITON 2.39  --   --   --   --   LATICACIDVEN  --  1.6 1.9 1.3 1.1    Recent Results (from the past 240 hour(s))  Urine culture     Status: None   Collection Time: 12/17/16 11:54 AM  Result Value Ref Range Status   Specimen Description URINE, RANDOM  Final   Special Requests NONE  Final   Culture   Final    NO GROWTH Performed at [facility], [address] [place], [place] [address]    Report Status 12/18/2016 FINAL  Final  Blood Culture (routine x 2)     Status: None (Preliminary result)   Collection Time: 12/17/16 12:00 PM  Result Value Ref Range Status   Specimen Description BLOOD RIGHT WRIST  Final   Special Requests   Final    BOTTLES DRAWN AEROBIC AND ANAEROBIC Blood Culture adequate volume   Culture   Final    NO GROWTH 4 DAYS Performed at [facility], [address], [place], [place] [address]    Report Status PENDING  Incomplete  Blood Culture (routine x 2)     Status: None (Preliminary result)   Collection Time: 12/17/16  1:20 PM  Result Value Ref Range Status   Specimen Description BLOOD LEFT ANTECUBITAL  Final   Special Requests   Final    BOTTLES DRAWN AEROBIC AND ANAEROBIC Blood Culture adequate volume   Culture   Final    NO GROWTH 4 DAYS Performed at [facility], [address] [place], [place] [address]    Report Status PENDING  Incomplete  MRSA PCR Screening     Status: None   Collection Time: 12/17/16 10:36 PM  Result Value Ref Range Status   MRSA by PCR NEGATIVE NEGATIVE Final     Comment:        The GeneXpert MRSA Assay (FDA approved for NASAL specimens only), is one component of a comprehensive MRSA colonization surveillance program. It is not intended to diagnose MRSA infection nor to guide or monitor treatment for MRSA infections.     Radiology Studies: Dg Cholangiogram Operative  Result Date: 12/19/2016 CLINICAL DATA:  83 year old male with a history of cholelithiasis EXAM: INTRAOPERATIVE CHOLANGIOGRAM TECHNIQUE: Cholangiographic images from the C-arm fluoroscopic device were submitted for interpretation post-operatively. Please see the procedural report for the amount of contrast and the fluoroscopy time utilized. COMPARISON:  None. FINDINGS: Surgical instruments project over the upper abdomen. There is cannulation of the cystic duct/gallbladder neck, with antegrade infusion of contrast. Caliber of the extrahepatic ductal system unremarkable No filling defect identified. Contrast is not observed to cross the ampulla IMPRESSION: Intraoperative cholangiogram demonstrates extrahepatic biliary ducts of unremarkable caliber, with no large filling defect identified. Note that contrast was not observed to cross the ampulla. Please refer to the dictated operative report for full details of intraoperative findings and procedure. Electronically Signed   By: Gilmer Mor D.O.   On: 12/19/2016 13:52   Scheduled Meds: . acetaminophen  1,000 mg Oral TID  . amLODipine  5 mg Oral Daily   And  . benazepril  20 mg Oral Daily  . aspirin EC  81 mg Oral Daily  . atorvastatin  20 mg Oral QHS  . chlorhexidine  15 mL Mouth Rinse BID  . donepezil  10 mg Oral QHS  . doxazosin  4 mg Oral Daily  . feeding supplement  237 mL Oral BID BM  . lactulose  20 g Oral Daily  . lip balm  1 application Topical BID  . loratadine  10 mg Oral Daily  . magnesium gluconate  500 mg Oral Daily  . mouth rinse  15 mL Mouth Rinse q12n4p  . niacin  500 mg Oral Daily  . pantoprazole  40 mg Oral Daily    . psyllium  1 packet Oral Daily  . QUEtiapine  75 mg Oral QHS  . rOPINIRole  1 mg Oral QHS  . saccharomyces boulardii  250 mg Oral BID  . sodium chloride flush  3 mL Intravenous Q12H  . sodium chloride flush  3 mL Intravenous Q12H   Continuous Infusions: . sodium chloride    . lactated ringers    . methocarbamol (ROBAXIN)  IV    . piperacillin-tazobactam (ZOSYN)  IV Stopped (12/21/16 0802)    LOS: 4 days   Merlene Laughter, DO [facility] Pager [phone]  If 7PM-7AM, please contact night-coverage [email or web address] Password [facility] 12/21/2016, 12:29 PM

## 2016-12-21 NOTE — Progress Notes (Signed)
Progress Note  Patient Name: Travis MoloneyJohn Wallace Date of Encounter: 12/21/2016  Primary Cardiologist: Travis FuSkains New  Subjective   No CP, no SOB, awake  Inpatient Medications    Scheduled Meds: . acetaminophen  1,000 mg Oral TID  . amLODipine  5 mg Oral Daily   And  . benazepril  20 mg Oral Daily  . aspirin EC  81 mg Oral Daily  . atorvastatin  20 mg Oral QHS  . chlorhexidine  15 mL Mouth Rinse BID  . donepezil  10 mg Oral QHS  . doxazosin  4 mg Oral Daily  . feeding supplement  237 mL Oral BID BM  . furosemide  40 mg Intravenous Once  . lactulose  20 g Oral Daily  . lip balm  1 application Topical BID  . loratadine  10 mg Oral Daily  . magnesium gluconate  500 mg Oral Daily  . mouth rinse  15 mL Mouth Rinse q12n4p  . niacin  500 mg Oral Daily  . pantoprazole  40 mg Oral Daily  . psyllium  1 packet Oral Daily  . QUEtiapine  75 mg Oral QHS  . rOPINIRole  1 mg Oral QHS  . saccharomyces boulardii  250 mg Oral BID  . sodium chloride flush  3 mL Intravenous Q12H  . sodium chloride flush  3 mL Intravenous Q12H   Continuous Infusions: . sodium chloride    . lactated ringers    . methocarbamol (ROBAXIN)  IV    . piperacillin-tazobactam (ZOSYN)  IV 3.375 g (12/21/16 0402)   PRN Meds: sodium chloride, alum & mag hydroxide-simeth, bisacodyl, guaiFENesin-dextromethorphan, hydrocortisone, hydrocortisone cream, HYDROmorphone (DILAUDID) injection, lactated ringers, LORazepam, magic mouthwash, magnesium hydroxide, menthol-cetylpyridinium, methocarbamol (ROBAXIN)  IV, ondansetron **OR** ondansetron (ZOFRAN) IV, phenol, prochlorperazine, QUEtiapine, sodium chloride flush, traMADol   Vital Signs    Vitals:   12/21/16 0200 12/21/16 0300 12/21/16 0400 12/21/16 0505  BP:  (!) 155/95    Pulse: (!) 39 87 (!) 37   Resp: 11 (!) 21 19   Temp:    98.3 F (36.8 C)  TempSrc:    Oral  SpO2: (!) 76% (!) 85% 98%   Weight:  180 lb 5.4 oz (81.8 kg)    Height:        Intake/Output Summary (Last 24  hours) at 12/21/16 0840 Last data filed at 12/21/16 0402  Gross per 24 hour  Intake              750 ml  Output              650 ml  Net              100 ml   Filed Weights   12/17/16 1437 12/17/16 2235 12/21/16 0300  Weight: 180 lb (81.6 kg) 176 lb 12.9 oz (80.2 kg) 180 lb 5.4 oz (81.8 kg)    Telemetry    Sinus brady at times in 30's - Personally Reviewed  ECG    SB - Personally Reviewed  Physical Exam   GEN: No acute distress.   Neck: No JVD Cardiac: Huston FoleyBrady reg, no murmurs, rubs, or gallops.  Respiratory: Clear to auscultation bilaterally. GI: Soft, nontender, non-distended - post op MS: No edema; No deformity. Neuro:  Nonfocal  Psych: dementia   No change from yesterday in exam, completed today  Labs    Chemistry  Recent Labs Lab 12/18/16 0327 12/19/16 0026 12/20/16 0403 12/20/16 0951  NA 140 142  --  144  K 4.3 4.1  --  4.4  CL 110 115*  --  116*  CO2 24 22  --  22  GLUCOSE 124* 110*  --  152*  BUN 22* 26*  --  29*  CREATININE 1.19 1.22 1.10 1.20  CALCIUM 7.7* 7.4*  --  7.8*  PROT 5.7* 5.1*  --  5.6*  ALBUMIN 2.8* 2.4*  --  2.2*  AST 21 26  --  45*  ALT 13* 14*  --  33  ALKPHOS 76 67  --  60  BILITOT 1.3* 1.5*  --  0.9  GFRNONAA 55* 53* >60 54*  GFRAA >60 >60 >60 >60  ANIONGAP 6 5  --  6     Hematology  Recent Labs Lab 12/19/16 0026 12/20/16 0951 12/21/16 0359  WBC 10.1 8.8 10.5  RBC 4.00* 3.82* 3.89*  HGB 12.0* 11.6* 11.7*  HCT 37.4* 35.3* 35.9*  MCV 93.5 92.4 92.3  MCH 30.0 30.4 30.1  MCHC 32.1 32.9 32.6  RDW 13.2 13.3 13.3  PLT 142* 189 228    Cardiac EnzymesNo results for input(s): TROPONINI in the last 168 hours. No results for input(s): TROPIPOC in the last 168 hours.   BNPNo results for input(s): BNP, PROBNP in the last 168 hours.   DDimer No results for input(s): DDIMER in the last 168 hours.   Radiology    Dg Cholangiogram Operative  Result Date: 12/19/2016 CLINICAL DATA:  83 year old male with a history of  cholelithiasis EXAM: INTRAOPERATIVE CHOLANGIOGRAM TECHNIQUE: Cholangiographic images from the C-arm fluoroscopic device were submitted for interpretation post-operatively. Please see the procedural report for the amount of contrast and the fluoroscopy time utilized. COMPARISON:  None. FINDINGS: Surgical instruments project over the upper abdomen. There is cannulation of the cystic duct/gallbladder neck, with antegrade infusion of contrast. Caliber of the extrahepatic ductal system unremarkable No filling defect identified. Contrast is not observed to cross the ampulla IMPRESSION: Intraoperative cholangiogram demonstrates extrahepatic biliary ducts of unremarkable caliber, with no large filling defect identified. Note that contrast was not observed to cross the ampulla. Please refer to the dictated operative report for full details of intraoperative findings and procedure. Electronically Signed   By: Travis Wallace D.O.   On: 12/19/2016 13:52    Cardiac Studies   ECG - SB with PAC  Patient Profile     83 y.o. male with CAD post CABG with dementia, bradycardia (sinus with normal PR), necrotic gall bladder s/p cholecystectomy  Assessment & Plan     - HR slow but asymptomatic. Seems to be able to increase HR at times on tele.   - no indication for pacemaker at this time  - Would avoid Namenda/memantine (can slow rate)  - If after this illness if has syncope/ symptoms with bradycardia, could consider pacer for palliation. For now no indication. Hemodynamically stable.  - Will sign off. Please call if any ?  Signed, Travis Schultz, MD  12/21/2016, 8:40 AM

## 2016-12-21 NOTE — Progress Notes (Addendum)
[facility]  [address] [place], [place] [address] Phone: [phone]  FAX: [place] [phone] [birth year]  CARE TEAM:  PCP: Egbert Garibaldi, PA-C  Outpatient Care Team: Patient Care Team: Emelia Loron as PCP - General (Internal Medicine)  Inpatient Treatment Team: Treatment Team: Attending Provider: Kerney Elbe, DO; Consulting Physician: Edison Pace, Md, MD; Rounding Team: Threasa Beards, MD; Consulting Physician: Lbcardiology, Michae Kava, MD   Problem List:   Active Problems:   Acute gangrenous cholecystitis s/p lap cholecystectomy 12/19/2016   Cholangitis   Severe sepsis [facility])   CAD (coronary artery disease)   HTN (hypertension)   OSA (obstructive sleep apnea)   Sepsis ([place])   Dementia   Altered mental status   Cholecystitis   Sinus bradycardia   Hyperbilirubinemia   AKI (acute kidney injury) ([place])   Peicholecystic abscess s/p OR drainage 12/19/2016   2 Days Post-Op  12/19/2016  POSTOP DIAGNOSIS:   Gangrenous cholecystitis with focal abscess, cholelithiasis  PROCEDURE:   Procedure(s): LAPAROSCOPIC CHOLECYSTECTOMY WITH INTRAOPERATIVE CHOLANGIOGRAM (5 port)  SURGEON:   Alphonsa Overall, M.D.   Assessment  Recovering  Plan:  -adv solid diet -drain -diuresis - try lasix x 1 since +4L -IV ABX x 5 days postop -GERD - restart PPI -restart home meds -restart donezapril - no memitidine per cardiology w bradycardia -VTE prophylaxis- SCDs, etc -mobilize as tolerated to help recovery  15 minutes spent in review, evaluation, examination, counseling, and coordination of care.  More than 50% of that time was spent in counseling.  Adin Hector, M.D., F.A.C.S. Gastrointestinal and Minimally Invasive Surgery [facility] [address], [place], [place] [address] [phone] Main / Paging   12/21/2016    Subjective: (Chief  complaint)  Confused Worried about urinating in bed but doesn't realize that he has [place] cath in place - reassured (mostly) Not much pain No nausea Still w bradycardia - [facility] & cardiology involved  Objective:  Vital signs:  Vitals:   12/21/16 0200 12/21/16 0300 12/21/16 0400 12/21/16 0505  BP:  (!) 155/95    Pulse: (!) 39 87 (!) 37   Resp: 11 (!) 21 19   Temp:    98.3 F (36.8 C)  TempSrc:    Oral  SpO2: (!) 76% (!) 85% 98%   Weight:  81.8 kg (180 lb 5.4 oz)    Height:        Last BM Date: 12/20/16  Intake/Output   Yesterday:  06/23 0701 - 06/24 0700 In: 750 [P.O.:600; IV Piggyback:150] Out: 650 [Urine:575; Drains:75] This shift:  No intake/output data recorded.  Bowel function:  Flatus: YES  BM:  YES  Drain: Serosanguinous   Physical Exam:  General: Pt awake/alert/oriented x1 in mild acute distress.  Restless but consolable Eyes: PERRL, normal EOM.  Sclera clear.  No icterus Neuro: CN II-XII intact w/o focal sensory/motor deficits. Lymph: No head/neck/groin lymphadenopathy Psych:  No delerium/psychosis/paranoia HENT: Normocephalic, Mucus membranes moist.  No thrush Neck: Supple, No tracheal deviation Chest: No chest wall pain w good excursion CV:  Pulses intact.  Regular rhythm MS: Normal AROM mjr joints.  No obvious deformity  Abdomen: Soft.  Nondistended.  Mildly tender at incisions only.  No evidence of peritonitis.  No incarcerated hernias.  Ext:  No deformity.  No mjr edema.  No cyanosis Skin: No petechiae / purpura  Results:   Labs: Results for orders placed or performed during the hospital  encounter of 12/17/16 (from the past 48 hour(s))  Creatinine, serum     Status: None   Collection Time: 12/20/16  4:03 AM  Result Value Ref Range   Creatinine, Ser 1.10 0.61 - 1.24 mg/dL   GFR calc non Af Amer >60 >60 mL/min   GFR calc Af Amer >60 >60 mL/min    Comment: (NOTE) The eGFR has been calculated using the CKD EPI equation. This calculation  has not been validated in all clinical situations. eGFR's persistently <60 mL/min signify possible Chronic Kidney Disease.   CBC with Differential/Platelet     Status: Abnormal   Collection Time: 12/20/16  9:51 AM  Result Value Ref Range   WBC 8.8 4.0 - 10.5 K/uL   RBC 3.82 (L) 4.22 - 5.81 MIL/uL   Hemoglobin 11.6 (L) 13.0 - 17.0 g/dL   HCT 35.3 (L) 39.0 - 52.0 %   MCV 92.4 78.0 - 100.0 fL   MCH 30.4 26.0 - 34.0 pg   MCHC 32.9 30.0 - 36.0 g/dL   RDW 13.3 11.5 - 15.5 %   Platelets 189 150 - 400 K/uL   Neutrophils Relative % 89 %   Neutro Abs 7.9 (H) 1.7 - 7.7 K/uL   Lymphocytes Relative 6 %   Lymphs Abs 0.5 (L) 0.7 - 4.0 K/uL   Monocytes Relative 5 %   Monocytes Absolute 0.4 0.1 - 1.0 K/uL   Eosinophils Relative 0 %   Eosinophils Absolute 0.0 0.0 - 0.7 K/uL   Basophils Relative 0 %   Basophils Absolute 0.0 0.0 - 0.1 K/uL  Comprehensive metabolic panel     Status: Abnormal   Collection Time: 12/20/16  9:51 AM  Result Value Ref Range   Sodium 144 135 - 145 mmol/L   Potassium 4.4 3.5 - 5.1 mmol/L   Chloride 116 (H) 101 - 111 mmol/L   CO2 22 22 - 32 mmol/L   Glucose, Bld 152 (H) 65 - 99 mg/dL   BUN 29 (H) 6 - 20 mg/dL   Creatinine, Ser 1.20 0.61 - 1.24 mg/dL   Calcium 7.8 (L) 8.9 - 10.3 mg/dL   Total Protein 5.6 (L) 6.5 - 8.1 g/dL   Albumin 2.2 (L) 3.5 - 5.0 g/dL   AST 45 (H) 15 - 41 U/L   ALT 33 17 - 63 U/L   Alkaline Phosphatase 60 38 - 126 U/L   Total Bilirubin 0.9 0.3 - 1.2 mg/dL   GFR calc non Af Amer 54 (L) >60 mL/min   GFR calc Af Amer >60 >60 mL/min    Comment: (NOTE) The eGFR has been calculated using the CKD EPI equation. This calculation has not been validated in all clinical situations. eGFR's persistently <60 mL/min signify possible Chronic Kidney Disease.    Anion gap 6 5 - 15  Magnesium     Status: Abnormal   Collection Time: 12/20/16  9:51 AM  Result Value Ref Range   Magnesium 2.6 (H) 1.7 - 2.4 mg/dL  Phosphorus     Status: None   Collection Time:  12/20/16  9:51 AM  Result Value Ref Range   Phosphorus 2.9 2.5 - 4.6 mg/dL  CBC with Differential/Platelet     Status: Abnormal   Collection Time: 12/21/16  3:59 AM  Result Value Ref Range   WBC 10.5 4.0 - 10.5 K/uL   RBC 3.89 (L) 4.22 - 5.81 MIL/uL   Hemoglobin 11.7 (L) 13.0 - 17.0 g/dL   HCT 35.9 (L) 39.0 - 52.0 %  MCV 92.3 78.0 - 100.0 fL   MCH 30.1 26.0 - 34.0 pg   MCHC 32.6 30.0 - 36.0 g/dL   RDW 13.3 11.5 - 15.5 %   Platelets 228 150 - 400 K/uL   Neutrophils Relative % 87 %   Neutro Abs 9.2 (H) 1.7 - 7.7 K/uL   Lymphocytes Relative 7 %   Lymphs Abs 0.7 0.7 - 4.0 K/uL   Monocytes Relative 6 %   Monocytes Absolute 0.6 0.1 - 1.0 K/uL   Eosinophils Relative 0 %   Eosinophils Absolute 0.0 0.0 - 0.7 K/uL   Basophils Relative 0 %   Basophils Absolute 0.0 0.0 - 0.1 K/uL    Imaging / Studies: Dg Cholangiogram Operative  Result Date: 12/19/2016 CLINICAL DATA:  83 year old male with a history of cholelithiasis EXAM: INTRAOPERATIVE CHOLANGIOGRAM TECHNIQUE: Cholangiographic images from the C-arm fluoroscopic device were submitted for interpretation post-operatively. Please see the procedural report for the amount of contrast and the fluoroscopy time utilized. COMPARISON:  None. FINDINGS: Surgical instruments project over the upper abdomen. There is cannulation of the cystic duct/gallbladder neck, with antegrade infusion of contrast. Caliber of the extrahepatic ductal system unremarkable No filling defect identified. Contrast is not observed to cross the ampulla IMPRESSION: Intraoperative cholangiogram demonstrates extrahepatic biliary ducts of unremarkable caliber, with no large filling defect identified. Note that contrast was not observed to cross the ampulla. Please refer to the dictated operative report for full details of intraoperative findings and procedure. Electronically Signed   By: Corrie Mckusick D.O.   On: 12/19/2016 13:52    Medications / Allergies: per  chart  Antibiotics: Anti-infectives    Start     Dose/Rate Route Frequency Ordered Stop   12/19/16 1201  piperacillin-tazobactam (ZOSYN) 3.375 GM/50ML IVPB    Comments:  Bridget Hartshorn   : cabinet override      12/19/16 1201 12/19/16 1239   12/17/16 2100  piperacillin-tazobactam (ZOSYN) IVPB 3.375 g     3.375 g 12.5 mL/hr over 240 Minutes Intravenous Every 8 hours 12/17/16 1640     12/17/16 1345  piperacillin-tazobactam (ZOSYN) IVPB 3.375 g     3.375 g 100 mL/hr over 30 Minutes Intravenous  Once 12/17/16 1338 12/17/16 1533   12/17/16 1345  vancomycin (VANCOCIN) 1,500 mg in sodium chloride 0.9 % 500 mL IVPB     1,500 mg 250 mL/hr over 120 Minutes Intravenous  Once 12/17/16 1338 12/17/16 1833        Note: Portions of this report may have been transcribed using voice recognition software. Every effort was made to ensure accuracy; however, inadvertent computerized transcription errors may be present.   Any transcriptional errors that result from this process are unintentional.     Adin Hector, M.D., F.A.C.S. Gastrointestinal and Minimally Invasive Surgery [facility] [address], [place], [place] [address] [phone] Main / Paging   12/21/2016

## 2016-12-22 DIAGNOSIS — R400 Somnolence: Secondary | ICD-10-CM

## 2016-12-22 LAB — PHOSPHORUS: PHOSPHORUS: 3 mg/dL (ref 2.5–4.6)

## 2016-12-22 LAB — CBC WITH DIFFERENTIAL/PLATELET
BASOS PCT: 0 %
Basophils Absolute: 0 10*3/uL (ref 0.0–0.1)
EOS PCT: 1 %
Eosinophils Absolute: 0.1 10*3/uL (ref 0.0–0.7)
HEMATOCRIT: 37.9 % — AB (ref 39.0–52.0)
Hemoglobin: 12.7 g/dL — ABNORMAL LOW (ref 13.0–17.0)
LYMPHS ABS: 1 10*3/uL (ref 0.7–4.0)
Lymphocytes Relative: 13 %
MCH: 30.5 pg (ref 26.0–34.0)
MCHC: 33.5 g/dL (ref 30.0–36.0)
MCV: 90.9 fL (ref 78.0–100.0)
Monocytes Absolute: 0.6 10*3/uL (ref 0.1–1.0)
Monocytes Relative: 8 %
Neutro Abs: 6 10*3/uL (ref 1.7–7.7)
Neutrophils Relative %: 78 %
PLATELETS: 254 10*3/uL (ref 150–400)
RBC: 4.17 MIL/uL — ABNORMAL LOW (ref 4.22–5.81)
RDW: 13 % (ref 11.5–15.5)
WBC: 7.7 10*3/uL (ref 4.0–10.5)

## 2016-12-22 LAB — COMPREHENSIVE METABOLIC PANEL
ALT: 32 U/L (ref 17–63)
AST: 31 U/L (ref 15–41)
Albumin: 2.4 g/dL — ABNORMAL LOW (ref 3.5–5.0)
Alkaline Phosphatase: 59 U/L (ref 38–126)
Anion gap: 7 (ref 5–15)
BILIRUBIN TOTAL: 0.8 mg/dL (ref 0.3–1.2)
BUN: 22 mg/dL — ABNORMAL HIGH (ref 6–20)
CHLORIDE: 110 mmol/L (ref 101–111)
CO2: 28 mmol/L (ref 22–32)
CREATININE: 0.9 mg/dL (ref 0.61–1.24)
Calcium: 8 mg/dL — ABNORMAL LOW (ref 8.9–10.3)
Glucose, Bld: 95 mg/dL (ref 65–99)
Potassium: 2.9 mmol/L — ABNORMAL LOW (ref 3.5–5.1)
Sodium: 145 mmol/L (ref 135–145)
TOTAL PROTEIN: 5.6 g/dL — AB (ref 6.5–8.1)

## 2016-12-22 LAB — CULTURE, BLOOD (ROUTINE X 2)
Culture: NO GROWTH
Culture: NO GROWTH
Special Requests: ADEQUATE
Special Requests: ADEQUATE

## 2016-12-22 LAB — MAGNESIUM: MAGNESIUM: 2 mg/dL (ref 1.7–2.4)

## 2016-12-22 MED ORDER — POTASSIUM PHOSPHATES 15 MMOLE/5ML IV SOLN
30.0000 mmol | Freq: Once | INTRAVENOUS | Status: DC
  Administered 2016-12-22: 30 mmol via INTRAVENOUS
  Filled 2016-12-22: qty 10

## 2016-12-22 MED ORDER — POTASSIUM CHLORIDE 10 MEQ/100ML IV SOLN
10.0000 meq | INTRAVENOUS | Status: AC
  Administered 2016-12-22 (×6): 10 meq via INTRAVENOUS
  Filled 2016-12-22 (×6): qty 100

## 2016-12-22 MED ORDER — ENOXAPARIN SODIUM 40 MG/0.4ML ~~LOC~~ SOLN
40.0000 mg | SUBCUTANEOUS | Status: DC
  Administered 2016-12-22 – 2016-12-25 (×4): 40 mg via SUBCUTANEOUS
  Filled 2016-12-22 (×4): qty 0.4

## 2016-12-22 NOTE — Progress Notes (Signed)
[facility] Progress Note  3 Days Post-Op  Subjective: CC: sleeping Per RN got ativan last night for agitation, very sleepy this AM, but wakes with touch and voice. Tolerating soft diet, no n/v. Not complaining of pain. Having BMs.  Mittens present, no restraints.  UOP good. Sinus brady with a run of SVT yesterday.   Objective: Vital signs in last 24 hours: Temp:  [97.6 F (36.4 C)-98.3 F (36.8 C)] 98.2 F (36.8 C) (06/25 0658) Pulse Rate:  [28-121] 41 (06/25 0658) Resp:  [11-20] 20 (06/25 0658) BP: (111-138)/(40-95) 130/54 (06/25 0658) SpO2:  [94 %-99 %] 97 % (06/25 0658) Weight:  [80.3 kg (177 lb 0.5 oz)] 80.3 kg (177 lb 0.5 oz) (06/24 1330) Last BM Date: 12/21/16  Intake/Output from previous day: 06/24 0701 - 06/25 0700 In: 580 [P.O.:480; IV Piggyback:100] Out: 1485 [Urine:1450; Drains:35] Intake/Output this shift: No intake/output data recorded.  PE: Gen:  Alert, NAD, pleasant Card:  Regular rate and rhythm, pedal pulses 2+ BL Pulm:  Normal effort, clear to auscultation bilaterally Abd: Soft, TTP of RUQ, very mildly distended, bowel sounds present in all 4 quadrants, incisions C/D/I. RUQ JP drain in place with serosanguinous output.  Skin: warm and dry, no rashes  Psych: A&Ox3   Lab Results:   Recent Labs  12/20/16 0951 12/21/16 0359  WBC 8.8 10.5  HGB 11.6* 11.7*  HCT 35.3* 35.9*  PLT 189 228   BMET  Recent Labs  12/20/16 0951 12/21/16 0702  NA 144 145  K 4.4 3.7  CL 116* 114*  CO2 22 25  GLUCOSE 152* 128*  BUN 29* 29*  CREATININE 1.20 1.03  CALCIUM 7.8* 8.0*   CMP     Component Value Date/Time   NA 145 12/21/2016 0702   K 3.7 12/21/2016 0702   CL 114 (H) 12/21/2016 0702   CO2 25 12/21/2016 0702   GLUCOSE 128 (H) 12/21/2016 0702   BUN 29 (H) 12/21/2016 0702   CREATININE 1.03 12/21/2016 0702   CALCIUM 8.0 (L) 12/21/2016 0702   PROT 5.5 (L) 12/21/2016 0702   ALBUMIN 2.3 (L) 12/21/2016 0702   AST 36 12/21/2016 0702   ALT 34  12/21/2016 0702   ALKPHOS 58 12/21/2016 0702   BILITOT 0.7 12/21/2016 0702   GFRNONAA >60 12/21/2016 0702   GFRAA >60 12/21/2016 0702   Lipase     Component Value Date/Time   LIPASE 32 12/18/2016 0327    Anti-infectives: Anti-infectives    Start     Dose/Rate Route Frequency Ordered Stop   12/19/16 1201  piperacillin-tazobactam (ZOSYN) 3.375 GM/50ML IVPB    Comments:  Wylene SimmerShepherd, Karen   : cabinet override      12/19/16 1201 12/19/16 1239   12/17/16 2100  piperacillin-tazobactam (ZOSYN) IVPB 3.375 g     3.375 g 12.5 mL/hr over 240 Minutes Intravenous Every 8 hours 12/17/16 1640 12/24/16 0802   12/17/16 1345  piperacillin-tazobactam (ZOSYN) IVPB 3.375 g     3.375 g 100 mL/hr over 30 Minutes Intravenous  Once 12/17/16 1338 12/17/16 1533   12/17/16 1345  vancomycin (VANCOCIN) 1,500 mg in sodium chloride 0.9 % 500 mL IVPB     1,500 mg 250 mL/hr over 120 Minutes Intravenous  Once 12/17/16 1338 12/17/16 1833       Assessment/Plan Acute gangrenous cholecystitis w/ focal abscess S/P laparoscopic cholecystectomy 12/19/16 Dr. Ezzard StandingNewman - POD#3 - continue IV abx for total of 5 days (end 6/27) - drain with 35 cc serosanguinous output in last 24 hrs -  possibly d/c, will discuss with MD - SOFT diet - ambulate, OOB Severe Sepsis - WBC 10.5 yesterday, afebrile. Sepsis physiology improved.  CAD s/p CABG HTN OSA Dementia Sinus bradycardia - cardiology signed off, recommend avoidance of memantine. HD stable Hyperbilirubinemia - trending down AKI - Cr 1.03 yesterday  FEN - SOFT diet VTE - SCDs, start lovenox ID - IV Zosyn (6/20>>)  Plan: Will discuss drain removal with MD. Continue IV abx. Continue to try to mobilize and get patient OOB. Start lovenox.    LOS: 5 days   Wells Guiles , [facility] 12/22/2016, 7:57 AM Pager: [phone] Consults: [phone] Mon-Fri 7:00 am-4:30 pm Sat-Sun 7:00 am-11:30 am

## 2016-12-22 NOTE — Care Management Important Message (Signed)
Important Message  Patient Details  Name: Travis MoloneyJohn Endicott MRN: [id number] Date of Birth: 09/03/1930   Medicare Important Message Given:  Yes    [name], [name] 12/22/2016, 11:23 AMImportant Message  Patient Details  Name: Travis MoloneyJohn Roark MRN: [id number] Date of Birth: 03/31/1931   Medicare Important Message Given:  Yes    [name], [name] 12/22/2016, 11:23 AM

## 2016-12-22 NOTE — Progress Notes (Signed)
Patient is very confuse, not steady in bed, not following  direction to ambulate.  Attempting to dislodge tubings and will not leave ice pack in place.

## 2016-12-22 NOTE — Progress Notes (Signed)
PROGRESS NOTE    Travis Wallace  RUE:[id number] DOB: 1931/05/31 DOA: 12/17/2016 PCP: Doreen Salvage, PA-C   Brief Narrative:  Travis Wallace is a 83 y.o. male with medical history significant of CAD,  HTN, HLD, dementia and other comorbids who presented with 1-2 day history of worsening confusion and lethargy. He has had fevers but unable to provide detailed history. He was evaluated 5 days ago for Chest Pain and was found to have gallbladder sludge but no active cholecystitis at that time in [facility]. He presented to [facility] for worsening confusion and lethargy was worked up and found to be septic 2/2 to Acute Cholecystitis. General Surgery Consulted and are planning on taking the patient for Surgery later today (12/19/16). Patient became bradycardic on the night of 12/17/16-6/21/18so Cardiology was consulted by General Surgery prior to his surgery for Cardiac Clearance. Patient was cleared and taken to surgery yesterday and found to have a gangrenous cholecystitis with focal abscess and cholelithiasis. He is improving but remained Bradycardic so an ECHOCardiogram was done 12/20/16. Patient was extremely somnolent this AM after receiving PRN lorazepam last night. He would wake to deep sternal rub but would fall back to sleep.   Assessment & Plan:   Active Problems:   Acute gangrenous cholecystitis s/p lap cholecystectomy 12/19/2016   Cholangitis   Severe sepsis (HCC)   CAD (coronary artery disease)   HTN (hypertension)   OSA (obstructive sleep apnea)   Sepsis (HCC)   Dementia   Altered mental status   Sinus bradycardia   Hyperbilirubinemia   AKI (acute kidney injury) (HCC)   Pericholecystic abscess s/p OR drainage 12/19/2016   Delirium   Alzheimer's dementia   Hypertension   GERD (gastroesophageal reflux disease)   Sleep apnea   Restless leg   Insomnia  Severe sepsis (HCC) from Acute Gangrenous Cholecystitis with Focal Abscess and Cholelithiasis POD 3, improving  -Admitted per Sepsis  protocol likely source being  intra-abdominal infection -Sepsis Physiology improved -Rehydrated with 56ml/kg -Initiated Broad spectrum antibiotics with IV Zosyn and will continue per General Surgery Recommendations for 5 days after surgery (2 days Left) and will end on 12/24/16 -Obtained Blood Cultures and showed NGTD at 4 days -Lactic Acid level went from 2.23 -> 1.9 -> 1.3 -> 1.1 -Obtained procalcitonin level and was 2.39 -CT Abdomen and Pelvis shows Gallbladder is distended to 4 cm. There is pericholecystic inflammation in the fat adjacent to the gallbladder. There is a calculus in the neck of the gallbladder measuring 10 mm. Small amount pericholecystic fluid. The distal common bile duct is upper limits normal at 6 mm. No common bile duct stone identified by CT. Findings were consistent with acute cholecystitis.  -WBC went from 11.2 -> 10.1 -> 8.8 -> 10.5 -> 7.7 -Pain Control with 0.5 mg IV Dilaudid q4hprn for Severe Pain and Zofran po/IV for Nausea/Vomiting -IVF Rehydration now D/C'd -General Surgery Consulted for further evaluation and Management -General Surgery took the patient for cholecystectomy on Friday December 19, 2016 -Cardiology was consulted for Pre-Op Clearance by General Surgery and he is at a moderate risk from a Cardiac Standpoint for Cholecystectomy -General Surgery Recommending current Abx for Therapeutic Course and continuing Soft Diet as there were no apparent post-operative complications  -C/w JP Drain and Care; Per Dr. Abbey Chatters leave drain in as cystic duct was significantly inflamed which puts patient at a higher risk for bile leak -Repeat CBC in AM  Acute Cholecystitis  -Appreciated Surgical consult,  -Will treat with IV antibiotics  and continue Zosyn,  -Await results of blood cultures; Showed NGTD at 5 Day -Per Report patient's Gallbladder was gangrenous and necrotic with focal abscess -As Above  Lactic Acidosis 2/2 to Sepsis -Improved.  -Lactic Acid level went  from 2.23 -> 1.9 -> 1.3 -> 1.1 -IVF Rehydration with NS now D/C'd  CAD (coronary artery disease)  -Stable,  -Held Home medications for surgery and NPO but now will be restarted -General Surgery Restarted ASA 81 mg po Daily, Atorvastatin 20 mg po QHS -Per wife has been asymptomatic since original CABG years ago -Cardiology Evaluated and appreciated Recc's    Transient Hypotension; Hypertension -Hypotension has resolved and BP was 130/54 -Held home medications while septic -Patient's Amlodipine 5 mg po Daily and Benazepril 20 mg po Daily restarted -C/w Doxazosin 4 mg po Daily -Continue to Monitor  Sinus Bradycardia -Not on any AV Nodal Blocking Agents; Remains Bradycardic but asymptomatic  -Donepezil 10 mg po qHS restarted by General Surgery -Cardiology feels as if it is likely enhanced by increased vagal tone -May need Atropine at times or a Beta Agonist such as Dopamine -Patient received 1 mg Atropine preeoperatively -ECHOCardiogram showed Ejection fraction was in the range of 55%   to 60%. Wall motion was normal; there were no regional wall   motion abnormalities but there was Grade 2 Diastolic Dysfunction. -Cardiology states that there is no indication for pacemaker at this time as HR is slow but asymptomatic; Recommends avoiding Namenda/Memantine -Cardiology recommends if after illness has syncope/ symptoms with Bradycardia could consider pacer for Palliation   OSA (obstructive sleep apnea) -Patient does not tolerate CPAP  Dementia  -Expect some degree of sundowning, On Sundowning precautions -Patient altered at baseline but due to infection have been more significantly confused. -Donepezil Held during admission and restarted by General Surgery today -Restarted Quetiapine 75 mg po Daily qhS and an additional 25 mg po Daily prn for restlessness  -Received IV Lorazepam 0.5 mg Last night and was very somnolent this AM  Mild Hyperbilirubinemia, improved -T Bili went from 1.4  -> 1.3 -> 1.5 -> 0.9 -> 0.7 -> 0.8 -Likely from Cholecystitis  -Continue to Monitor and repeat CMP in AM  AKI -No Prior Baseline Creatinine  -Patient's BUN/Cr went from 26/1.45 -> 22/1.19 -> 26/1.22 -> 29/1.22 -> 29/1.03 -> 22/0.90 -Patient received one dose of IV Lasix 40 mg by General Surgery -Benazepril 20 mg po Daily restarted -Continue to Monitor and Repeat CMP in AM  Mildly Elevated AST, improved -AST went from 45 -> 36 -> 31 -Likely Reactive to Surgery/Hypotension -Continue to Monitor and Repeat CMP in AM  GERD -C/w Pantoprazole 40 mg po Daily  Restless Leg Syndrome -C/w Ropinirole 1 mg po qHS  HLD/Dyslipidemia -C/w Atorvastatin 20 mg po Daily  Hypokalemia -Patient's Potassium was 2.9 this AM -Replete with 60 mEQ of IV KCl and IV KPhos today as patient was more somnolent -Continue to Monitor and Replete as Necessary -Repeat CMP in AM  Hypophosphatemia -Patient's Phos Level was 2.4 yesterday and improved to 3.0 -Replete with IV KPhos -Repeat Phos Level in AM  DVT prophylaxis: SCDs; Enoxaparin 40 mg sq q24h Code Status: FULL CODE Family Communication: No Family present at bedside Disposition Plan: Pending PT Eval but likely [name] at D/C    Consultants:   General Surgery Dr. Newman/Dr.Hoxworth/Dr. Abbey Chatters   Cardiology Dr. Anne Fu   Procedures:   LAPAROSCOPIC CHOLECYSTECTOMY WITH INTRAOPERATIVE CHOLANGIOGRAM (5 port) by Dr. Ezzard Standing on 12/19/16  ECHOCARDIOGRAM Study Conclusions  - Left ventricle:  The cavity size was normal. Systolic function was   normal. The estimated ejection fraction was in the range of 55%   to 60%. Wall motion was normal; there were no regional wall   motion abnormalities. Features are consistent with a pseudonormal   left ventricular filling pattern, with concomitant abnormal   relaxation and increased filling pressure (grade 2 diastolic   dysfunction). Doppler parameters are consistent with high   ventricular filling pressure. -  Aortic valve: Transvalvular velocity was within the normal range.   There was no stenosis. There was no regurgitation. - Mitral valve: Transvalvular velocity was within the normal range.   There was no evidence for stenosis. There was no regurgitation. - Right ventricle: The cavity size was normal. Wall thickness was   normal. Systolic function was normal. - Tricuspid valve: There was no regurgitation.   Antimicrobials:  Anti-infectives    Start     Dose/Rate Route Frequency Ordered Stop   12/19/16 1201  piperacillin-tazobactam (ZOSYN) 3.375 GM/50ML IVPB    Comments:  Wylene Simmer   : cabinet override      12/19/16 1201 12/19/16 1239   12/17/16 2100  piperacillin-tazobactam (ZOSYN) IVPB 3.375 g     3.375 g 12.5 mL/hr over 240 Minutes Intravenous Every 8 hours 12/17/16 1640 12/24/16 0802   12/17/16 1345  piperacillin-tazobactam (ZOSYN) IVPB 3.375 g     3.375 g 100 mL/hr over 30 Minutes Intravenous  Once 12/17/16 1338 12/17/16 1533   12/17/16 1345  vancomycin (VANCOCIN) 1,500 mg in sodium chloride 0.9 % 500 mL IVPB     1,500 mg 250 mL/hr over 120 Minutes Intravenous  Once 12/17/16 1338 12/17/16 1833     Subjective: Seen and examined and was extremely somnolent after receiving Ativan. Was snoring when I went and he was arousable but very drowsy. No Nausea or vomiting. Nursing did not report any significant events.   Objective: Vitals:   12/21/16 1330 12/21/16 1600 12/21/16 2114 12/22/16 0658  BP: (!) 119/50  (!) 128/40 (!) 130/54  Pulse: (!) 58  (!) 57 (!) 41  Resp: 16  16 20   Temp: 97.8 F (36.6 C)  97.6 F (36.4 C) 98.2 F (36.8 C)  TempSrc: Oral  Oral Oral  SpO2: 94% 94% 99% 97%  Weight: 80.3 kg (177 lb 0.5 oz)     Height: 5\' 4"  (1.626 m)       Intake/Output Summary (Last 24 hours) at 12/22/16 1335 Last data filed at 12/22/16 0600  Gross per 24 hour  Intake              580 ml  Output              315 ml  Net              265 ml   Filed Weights   12/17/16 2235  12/21/16 0300 12/21/16 1330  Weight: 80.2 kg (176 lb 12.9 oz) 81.8 kg (180 lb 5.4 oz) 80.3 kg (177 lb 0.5 oz)   Examination: Physical Exam:  Constitutional: Patient is a 83 yo Caucasian male in NAD appearing very somnolent but arousable.  Eyes: Patient would not let me examine eyes. Lids appear normal ENMT: Grossly normal hearing. Mucous Membranes appear moist Neck: Supple with no JVD Respiratory: Diminished but no wheezing/rales/rhonchi. Patient was not tachypenic or using any accessory muscles to breathe Cardiovascular: Bradycardic Rate; Mild LE edema Abdomen:  Soft NT, ND. Abdominal Binder in place so unable to view incisions. Has JP drain  in place GU: Deferred. Wearing Condom Catheter Musculoskeletal: No contractures. Hands in mitts Skin: Warm and dry. No rashes or lesions on a limited skin eval Neurologic: No appreciable focal deficits. Moves all extremities independent Psychiatric: Very somnolent but arousable. Does not have intact judgement or insight   Data Reviewed: I have personally reviewed following labs and imaging studies  CBC:  Recent Labs Lab 12/17/16 1154 12/18/16 0327 12/19/16 0026 12/20/16 0951 12/21/16 0359 12/22/16 0937  WBC 12.9* 11.2* 10.1 8.8 10.5 7.7  NEUTROABS 11.7*  --  8.7* 7.9* 9.2* 6.0  HGB 14.7 12.0* 12.0* 11.6* 11.7* 12.7*  HCT 44.8 37.3* 37.4* 35.3* 35.9* 37.9*  MCV 93.1 93.7 93.5 92.4 92.3 90.9  PLT 159 139* 142* 189 228 254   Basic Metabolic Panel:  Recent Labs Lab 12/18/16 0327 12/19/16 0026 12/20/16 0403 12/20/16 0951 12/21/16 0702 12/22/16 0937  NA 140 142  --  144 145 145  K 4.3 4.1  --  4.4 3.7 2.9*  CL 110 115*  --  116* 114* 110  CO2 24 22  --  22 25 28   GLUCOSE 124* 110*  --  152* 128* 95  BUN 22* 26*  --  29* 29* 22*  CREATININE 1.19 1.22 1.10 1.20 1.03 0.90  CALCIUM 7.7* 7.4*  --  7.8* 8.0* 8.0*  MG 2.1 2.3  --  2.6* 2.4 2.0  PHOS 2.5 3.0  --  2.9 2.4* 3.0   GFR: Estimated Creatinine Clearance: 59.5 mL/min (by  C-G formula based on SCr of 0.9 mg/dL). Liver Function Tests:  Recent Labs Lab 12/18/16 0327 12/19/16 0026 12/20/16 0951 12/21/16 0702 12/22/16 0937  AST 21 26 45* 36 31  ALT 13* 14* 33 34 32  ALKPHOS 76 67 60 58 59  BILITOT 1.3* 1.5* 0.9 0.7 0.8  PROT 5.7* 5.1* 5.6* 5.5* 5.6*  ALBUMIN 2.8* 2.4* 2.2* 2.3* 2.4*    Recent Labs Lab 12/17/16 1154 12/18/16 0327  LIPASE 32 32   No results for input(s): AMMONIA in the last 168 hours. Coagulation Profile:  Recent Labs Lab 12/17/16 2223  INR 1.17   Cardiac Enzymes: No results for input(s): CKTOTAL, CKMB, CKMBINDEX, TROPONINI in the last 168 hours. BNP (last 3 results) No results for input(s): PROBNP in the last 8760 hours. HbA1C: No results for input(s): HGBA1C in the last 72 hours. CBG: No results for input(s): GLUCAP in the last 168 hours. Lipid Profile: No results for input(s): CHOL, HDL, LDLCALC, TRIG, CHOLHDL, LDLDIRECT in the last 72 hours. Thyroid Function Tests: No results for input(s): TSH, T4TOTAL, FREET4, T3FREE, THYROIDAB in the last 72 hours. Anemia Panel: No results for input(s): VITAMINB12, FOLATE, FERRITIN, TIBC, IRON, RETICCTPCT in the last 72 hours. Sepsis Labs:  Recent Labs Lab 12/17/16 2043 12/17/16 2210 12/18/16 0015 12/18/16 2208 12/19/16 0026  PROCALCITON 2.39  --   --   --   --   LATICACIDVEN  --  1.6 1.9 1.3 1.1    Recent Results (from the past 240 hour(s))  Urine culture     Status: None   Collection Time: 12/17/16 11:54 AM  Result Value Ref Range Status   Specimen Description URINE, RANDOM  Final   Special Requests NONE  Final   Culture   Final    NO GROWTH Performed at [facility], [address] [place], [place] [address]    Report Status 12/18/2016 FINAL  Final  Blood Culture (routine x 2)     Status: None   Collection  Time: 12/17/16 12:00 PM  Result Value Ref Range Status   Specimen Description BLOOD RIGHT WRIST  Final   Special Requests   Final    BOTTLES DRAWN  AEROBIC AND ANAEROBIC Blood Culture adequate volume   Culture   Final    NO GROWTH 5 DAYS Performed at [facility], [address] [place], [place] [address]    Report Status 12/22/2016 FINAL  Final  Blood Culture (routine x 2)     Status: None   Collection Time: 12/17/16  1:20 PM  Result Value Ref Range Status   Specimen Description BLOOD LEFT ANTECUBITAL  Final   Special Requests   Final    BOTTLES DRAWN AEROBIC AND ANAEROBIC Blood Culture adequate volume   Culture   Final    NO GROWTH 5 DAYS Performed at [facility], [address] [place], [place] [address]    Report Status 12/22/2016 FINAL  Final  MRSA PCR Screening     Status: None   Collection Time: 12/17/16 10:36 PM  Result Value Ref Range Status   MRSA by PCR NEGATIVE NEGATIVE Final    Comment:        The GeneXpert MRSA Assay (FDA approved for NASAL specimens only), is one component of a comprehensive MRSA colonization surveillance program. It is not intended to diagnose MRSA infection nor to guide or monitor treatment for MRSA infections.     Radiology Studies: No results found. Scheduled Meds: . acetaminophen  1,000 mg Oral TID  . amLODipine  5 mg Oral Daily   And  . benazepril  20 mg Oral Daily  . aspirin EC  81 mg Oral Daily  . atorvastatin  20 mg Oral QHS  . chlorhexidine  15 mL Mouth Rinse BID  . donepezil  10 mg Oral QHS  . doxazosin  4 mg Oral Daily  . enoxaparin (LOVENOX) injection  40 mg Subcutaneous Q24H  . feeding supplement  237 mL Oral BID BM  . lactulose  20 g Oral Daily  . lip balm  1 application Topical BID  . loratadine  10 mg Oral Daily  . magnesium gluconate  500 mg Oral Daily  . mouth rinse  15 mL Mouth Rinse q12n4p  . niacin  500 mg Oral Daily  . pantoprazole  40 mg Oral Daily  . psyllium  1 packet Oral Daily  . QUEtiapine  75 mg Oral QHS  . rOPINIRole  1 mg Oral QHS  . saccharomyces boulardii  250 mg Oral BID  . sodium chloride flush  3 mL Intravenous Q12H  .  sodium chloride flush  3 mL Intravenous Q12H   Continuous Infusions: . sodium chloride    . lactated ringers    . methocarbamol (ROBAXIN)  IV    . piperacillin-tazobactam (ZOSYN)  IV Stopped (12/22/16 0903)  . potassium phosphate IVPB (mmol) 30 mmol (12/22/16 1302)    LOS: 5 days   Merlene Laughter, DO [facility] Pager [phone]  If 7PM-7AM, please contact night-coverage [email or web address] Password [email or web address] 12/22/2016, 1:35 PM

## 2016-12-22 NOTE — Progress Notes (Signed)
Nutrition Follow-up  INTERVENTION:   Continue Ensure Surgery BID, each provides 330 kcal and 18g protein. Encourage PO intake RD to continue to monitor  NUTRITION DIAGNOSIS:   Inadequate oral intake related to inability to eat as evidenced by meal completion < 50%.  Ongoing, now evidenced by meal completion <50%.  GOAL:   Patient will meet greater than or equal to 90% of their needs  Progressing.  MONITOR:   PO intake, Supplement acceptance, Labs, Weight trends, I & O's  ASSESSMENT:   83 y.o. male with medical history significant of CAD, HTN, HLD, dementia. Presented with 1-2 day history of worsening confusion and lethargy and fevers. Two days PTA family noted pt to be more lethargic. He was seen at [facility] 5 days PTA and at that time was told he may have some sludge in his gallbladder but at that time showed no acute cholecystitis he was at that point complaining of some epigastric/chest pain. 6/22: s/p LAPAROSCOPIC CHOLECYSTECTOMY WITH INTRAOPERATIVE CHOLANGIOGRAM   Patient currently sleeping at this time. No family present in room. PO intake recorded: 50% meal completion.  Noted that 1 Ensure supplement was provided. Did not see any at bedside.  Nutrition focused physical exam shows no sign of depletion of muscle mass or body fat.  Medications: Mg gluconate tablet daily, Niacin tablet daily, Protonix tablet daily, Florastor capsule BID, IV K Phos today Labs reviewed: Low K Mg/Phos WNL  Diet Order:  DIET SOFT Room service appropriate? Yes; Fluid consistency: Thin  Skin:  Reviewed, no issues  Last BM:  6/24  Height:   Ht Readings from Last 1 Encounters:  12/21/16 5\' 4"  (1.626 m)    Weight:   Wt Readings from Last 1 Encounters:  12/21/16 177 lb 0.5 oz (80.3 kg)    Ideal Body Weight:  59.09 kg  BMI:  Body mass index is 30.39 kg/m.  Estimated Nutritional Needs:   Kcal:  1445-1685 (18-21 kcal/kg)  Protein:  70-80 grams  Fluid:  >/= 1.7  L/day  EDUCATION NEEDS:   No education needs identified at this time  Tilda FrancoLindsey Margel Joens, MS, RD, LDN Pager: [phone] After Hours Pager: [phone]

## 2016-12-22 NOTE — Progress Notes (Signed)
OT Cancellation Note  Patient Details Name: Travis MoloneyJohn Wallace MRN: [id number] DOB: 03/20/1931   Cancelled Treatment:    Reason Eval/Treat Not Completed: Fatigue/lethargy limiting ability to participate  Will check on pt next day  Lise AuerLori Daniele Yankowski, OT [phone]  Jeanie SewerEDDING, Metro KungLorraine D 12/22/2016, 1:22 PM

## 2016-12-22 NOTE — Progress Notes (Addendum)
PT Cancellation Note  Patient Details Name: Travis MoloneyJohn Wallace MRN: [id number] DOB: 04/30/1931   Cancelled Treatment:    Reason Eval/Treat Not Completed: Fatigue/lethargy limiting ability to participate. Will check back later to attempt to complete PT eval.  Addendum: Checked back at 1300 and pt still too lethargic to participate.  Will check back tomorrow.   Enzo MontgomeryKaren L Zhoey Blackstock 12/22/2016, 9:41 AM

## 2016-12-22 NOTE — Progress Notes (Signed)
Pt from [name].

## 2016-12-23 DIAGNOSIS — E876 Hypokalemia: Secondary | ICD-10-CM

## 2016-12-23 LAB — CBC WITH DIFFERENTIAL/PLATELET
BASOS ABS: 0 10*3/uL (ref 0.0–0.1)
BASOS PCT: 0 %
EOS ABS: 0.1 10*3/uL (ref 0.0–0.7)
Eosinophils Relative: 2 %
HEMATOCRIT: 36 % — AB (ref 39.0–52.0)
HEMOGLOBIN: 11.9 g/dL — AB (ref 13.0–17.0)
LYMPHS PCT: 19 %
Lymphs Abs: 1.3 10*3/uL (ref 0.7–4.0)
MCH: 30.1 pg (ref 26.0–34.0)
MCHC: 33.1 g/dL (ref 30.0–36.0)
MCV: 91.1 fL (ref 78.0–100.0)
MONOS PCT: 8 %
Monocytes Absolute: 0.5 10*3/uL (ref 0.1–1.0)
NEUTROS ABS: 4.9 10*3/uL (ref 1.7–7.7)
NEUTROS PCT: 71 %
Platelets: 278 10*3/uL (ref 150–400)
RBC: 3.95 MIL/uL — ABNORMAL LOW (ref 4.22–5.81)
RDW: 13 % (ref 11.5–15.5)
WBC: 6.8 10*3/uL (ref 4.0–10.5)

## 2016-12-23 LAB — COMPREHENSIVE METABOLIC PANEL
ALBUMIN: 2.2 g/dL — AB (ref 3.5–5.0)
ALT: 24 U/L (ref 17–63)
AST: 19 U/L (ref 15–41)
Alkaline Phosphatase: 57 U/L (ref 38–126)
Anion gap: 7 (ref 5–15)
BUN: 18 mg/dL (ref 6–20)
CHLORIDE: 108 mmol/L (ref 101–111)
CO2: 26 mmol/L (ref 22–32)
CREATININE: 0.94 mg/dL (ref 0.61–1.24)
Calcium: 7.8 mg/dL — ABNORMAL LOW (ref 8.9–10.3)
GFR calc Af Amer: >60 mL/min (ref >60)
GFR calc non Af Amer: >60 mL/min (ref >60)
GLUCOSE: 104 mg/dL — AB (ref 65–99)
Potassium: 3.6 mmol/L (ref 3.5–5.1)
SODIUM: 141 mmol/L (ref 135–145)
Total Bilirubin: 0.6 mg/dL (ref 0.3–1.2)
Total Protein: 5.1 g/dL — ABNORMAL LOW (ref 6.5–8.1)

## 2016-12-23 LAB — PHOSPHORUS: Phosphorus: 3.4 mg/dL (ref 2.5–4.6)

## 2016-12-23 LAB — MAGNESIUM: Magnesium: 1.7 mg/dL (ref 1.7–2.4)

## 2016-12-23 NOTE — Progress Notes (Addendum)
PROGRESS NOTE    Travis Wallace  ZOX:[id number] DOB: Mar 06, 1931 DOA: 12/17/2016 PCP: Doreen Salvage, PA-C   Brief Narrative:  Travis Wallace is a 83 y.o. male with medical history significant of CAD,  HTN, HLD, dementia and other comorbids who presented with 1-2 day history of worsening confusion and lethargy. He has had fevers but unable to provide detailed history. He was evaluated 5 days ago for Chest Pain and was found to have gallbladder sludge but no active cholecystitis at that time in [facility]. He presented to [facility] for worsening confusion and lethargy was worked up and found to be septic 2/2 to Acute Cholecystitis. General Surgery Consulted and are planning on taking the patient for Surgery later today (12/19/16). Patient became bradycardic on the night of 12/17/16-6/21/18so Cardiology was consulted by General Surgery prior to his surgery for Cardiac Clearance. Patient was cleared and taken to surgery yesterday and found to have a gangrenous cholecystitis with focal abscess and cholelithiasis. He is improving but remained Bradycardic so an ECHOCardiogram was done 12/20/16. Patient was more alert this AM and interactive and stated that he did not have a good night.    Assessment & Plan:   Active Problems:   Acute gangrenous cholecystitis s/p lap cholecystectomy 12/19/2016   Cholangitis   Severe sepsis (HCC)   CAD (coronary artery disease)   HTN (hypertension)   OSA (obstructive sleep apnea)   Sepsis (HCC)   Dementia   Altered mental status   Sinus bradycardia   Hyperbilirubinemia   AKI (acute kidney injury) (HCC)   Pericholecystic abscess s/p OR drainage 12/19/2016   Delirium   Alzheimer's dementia   Hypertension   GERD (gastroesophageal reflux disease)   Sleep apnea   Restless leg   Insomnia  Severe sepsis (HCC) from Acute Gangrenous Cholecystitis with Focal Abscess and Cholelithiasis POD 4, improving  -Admitted per Sepsis protocol likely source being  intra-abdominal  infection -Sepsis Physiology improved -Rehydrated with 12ml/kg -Initiated Broad spectrum antibiotics with IV Zosyn and will continue per General Surgery Recommendations for 5 days after surgery (1 days Left) and will end on 12/24/16 -Obtained Blood Cultures and showed NGTD at 4 days -Lactic Acid level went from 2.23 -> 1.9 -> 1.3 -> 1.1 -Obtained initial procalcitonin level and was 2.39 -CT Abdomen and Pelvis showed Gallbladder was distended to 4 cm. There is pericholecystic inflammation in the fat adjacent to the gallbladder. There is a calculus in the neck of the gallbladder measuring 10 mm. Small amount pericholecystic fluid. The distal common bile duct is upper limits normal at 6 mm. No common bile duct stone identified by CT. Findings were consistent with acute cholecystitis.  -WBC went from 11.2 -> 10.1 -> 8.8 -> 10.5 -> 7.7 -> 6.8 -Pain Control with 0.5 mg IV Dilaudid q4hprn for Severe Pain and Zofran po/IV for Nausea/Vomiting -IVF Rehydration now D/C'd -General Surgery Consulted for further evaluation and Management -General Surgery took the patient for cholecystectomy on Friday December 19, 2016 -Cardiology was consulted for Pre-Op Clearance by General Surgery and he is at a moderate risk from a Cardiac Standpoint for Cholecystectomy -General Surgery Recommending current Abx for Therapeutic Course and continuing Soft Diet as there were no apparent post-operative complications  -C/w JP Drain and Care; Per Dr. Abbey Chatters yesterday leave drain in as cystic duct was significantly inflamed which puts patient at a higher risk for bile leak; May remove drain in AM if character of drain does not change -Repeat CBC in AM  Acute Cholecystitis  -  Appreciated Surgical consult,  -Will treat with IV antibiotics and continue Zosyn,  -Await results of blood cultures; Showed NGTD at 5 Day -Per Report patient's Gallbladder was gangrenous and necrotic with focal abscess -As Above  Lactic Acidosis 2/2 to  Sepsis -Improved.  -Lactic Acid level went from 2.23 -> 1.9 -> 1.3 -> 1.1 -IVF Rehydration with NS now D/C'd  CAD (coronary artery disease)  -Stable,  -Held Home medications for surgery and NPO but now will be restarted -General Surgery Restarted ASA 81 mg po Daily, Atorvastatin 20 mg po QHS -Per wife has been asymptomatic since original CABG years ago -Cardiology Evaluated and appreciated Recc's    Transient Hypotension; Hypertension -Hypotension has resolved and BP was 121/65 -Held home medications while septic -Patient's Amlodipine 5 mg po Daily and Benazepril 20 mg po Daily restarted -C/w Doxazosin 4 mg po Daily -Continue to Monitor  Sinus Bradycardia -Not on any AV Nodal Blocking Agents; Remains Bradycardic but asymptomatic  -Donepezil 10 mg po qHS restarted by General Surgery -Cardiology feels as if it is likely enhanced by increased vagal tone -May need Atropine at times or a Beta Agonist such as Dopamine -Patient received 1 mg Atropine preeoperatively -ECHOCardiogram showed Ejection fraction was in the range of 55%   to 60%. Wall motion was normal; there were no regional wall   motion abnormalities but there was Grade 2 Diastolic Dysfunction. -Cardiology states that there is no indication for pacemaker at this time as HR is slow but asymptomatic; Recommends avoiding Namenda/Memantine -Cardiology recommends if after illness has syncope/ symptoms with Bradycardia could consider pacer for Palliation -C/w Telemetry    OSA (obstructive sleep apnea) -Patient does not tolerate CPAP  Dementia  -Expect some degree of sundowning, On Sundowning precautions -Patient altered at baseline but due to infection have been more significantly confused. -Donepezil Held during admission and restarted by General Surgery today -Restarted Quetiapine 75 mg po Daily qhS and an additional 25 mg po Daily prn for restlessness  -Has PRN   Mild Hyperbilirubinemia, improved -T Bili went from 1.4  -> 1.3 -> 1.5 -> 0.9 -> 0.7 -> 0.8 ->0.6 -Likely from Cholecystitis  -Continue to Monitor and repeat CMP in AM  AKI -No Prior Baseline Creatinine  -Patient's BUN/Cr went from 26/1.45 -> 22/1.19 -> 26/1.22 -> 29/1.22 -> 29/1.03 -> 22/0.90 -> 18/0.94 -Patient received one dose of IV Lasix 40 mg by General Surgery 2 days  -Benazepril 20 mg po Daily restarted -Continue to Monitor and Repeat CMP in AM  Mildly Elevated AST, improved -AST went from 45 -> 36 -> 31 -> 19 -Likely Reactive to Surgery/Hypotension -Continue to Monitor and Repeat CMP in AM  GERD -C/w Pantoprazole 40 mg po Daily  Restless Leg Syndrome -C/w Ropinirole 1 mg po qHS  HLD/Dyslipidemia -C/w Atorvastatin 20 mg po Daily  Hypokalemia -Patient's Potassium was 2.9 yesterday and improved to 3.6 -Continue to Monitor and Replete as Necessary -Repeat CMP in AM  Hypophosphatemia -Patient's Phos Level was 2.4 yesterday and improved to 3.4 -Continue to Monitor and Replete as Necessary -Repeat CMP in AM   DVT prophylaxis: SCDs; Enoxaparin 40 mg sq q24h Code Status: FULL CODE Family Communication: No Family present at bedside Disposition Plan: [name] at D/C within the next 24-48 hours  Consultants:   General Surgery Dr. Newman/Dr.Hoxworth/Dr. Abbey Chatters   Cardiology Dr. Anne Fu   Procedures:   LAPAROSCOPIC CHOLECYSTECTOMY WITH INTRAOPERATIVE CHOLANGIOGRAM (5 port) by Dr. Ezzard Standing on 12/19/16  ECHOCARDIOGRAM Study Conclusions  - Left ventricle: The cavity  size was normal. Systolic function was   normal. The estimated ejection fraction was in the range of 55%   to 60%. Wall motion was normal; there were no regional wall   motion abnormalities. Features are consistent with a pseudonormal   left ventricular filling pattern, with concomitant abnormal   relaxation and increased filling pressure (grade 2 diastolic   dysfunction). Doppler parameters are consistent with high   ventricular filling pressure. - Aortic  valve: Transvalvular velocity was within the normal range.   There was no stenosis. There was no regurgitation. - Mitral valve: Transvalvular velocity was within the normal range.   There was no evidence for stenosis. There was no regurgitation. - Right ventricle: The cavity size was normal. Wall thickness was   normal. Systolic function was normal. - Tricuspid valve: There was no regurgitation.   Antimicrobials:  Anti-infectives    Start     Dose/Rate Route Frequency Ordered Stop   12/19/16 1201  piperacillin-tazobactam (ZOSYN) 3.375 GM/50ML IVPB    Comments:  Wylene SimmerShepherd, Karen   : cabinet override      12/19/16 1201 12/19/16 1239   12/17/16 2100  piperacillin-tazobactam (ZOSYN) IVPB 3.375 g     3.375 g 12.5 mL/hr over 240 Minutes Intravenous Every 8 hours 12/17/16 1640 12/24/16 0802   12/17/16 1345  piperacillin-tazobactam (ZOSYN) IVPB 3.375 g     3.375 g 100 mL/hr over 30 Minutes Intravenous  Once 12/17/16 1338 12/17/16 1533   12/17/16 1345  vancomycin (VANCOCIN) 1,500 mg in sodium chloride 0.9 % 500 mL IVPB     1,500 mg 250 mL/hr over 120 Minutes Intravenous  Once 12/17/16 1338 12/17/16 1833     Subjective: Seen and examined and was more alert today but still confused. Thought his daughter was his cousin. Stated he had a bad night and had some abdominal pain. No nausea or vomiting and tolerating food well. No other complains or concerns at this time.   Objective: Vitals:   12/22/16 1445 12/22/16 2221 12/23/16 0622 12/23/16 1411  BP: (!) 155/55 (!) 122/56 (!) 114/45 121/65  Pulse: (!) 47 (!) 41 (!) 43 (!) 49  Resp: 19 20 18 18   Temp: 98.9 F (37.2 C) 97.6 F (36.4 C) 98.1 F (36.7 C) 98.2 F (36.8 C)  TempSrc: Oral Axillary Oral Oral  SpO2: 98% 96% 94% 97%  Weight:      Height:        Intake/Output Summary (Last 24 hours) at 12/23/16 1508 Last data filed at 12/23/16 0900  Gross per 24 hour  Intake              410 ml  Output              120 ml  Net               290 ml   Filed Weights   12/17/16 2235 12/21/16 0300 12/21/16 1330  Weight: 80.2 kg (176 lb 12.9 oz) 81.8 kg (180 lb 5.4 oz) 80.3 kg (177 lb 0.5 oz)   Examination: Physical Exam:  Constitutional: Pleasantly confused and demented 83 yo Caucasian Male in NAD.  Eyes: Sclerae anicteric, Conjunctivae Non-injected ENMT: Grossly normal hearing. Mucous membranes appear moist. External ears and nose appear normal Neck: Supple with no JVD Respiratory: CTAB with no wheezing/rales/rhonchi. Patient not tachypenic or using any accessory muscles to breathe Cardiovascular: Bradycardic rate. Minimal Lower extremity edema Abdomen: Soft, Mildly tender, ND. Abdominal Binder in place. Lower Abdomen bowel sounds positive GU: Deferred  Musculoskeletal: No contractures or cyanosis Skin:  Warm and dry, No rashes or lesions on limited skin eval. Abdominal Binder in place Neurologic: CN 2-12 grossly intact. No focal deficits Psychiatric: Awake and alert but not oriented. Impaired judgement and insight   Data Reviewed: I have personally reviewed following labs and imaging studies  CBC:  Recent Labs Lab 12/19/16 0026 12/20/16 0951 12/21/16 0359 12/22/16 0937 12/23/16 0451  WBC 10.1 8.8 10.5 7.7 6.8  NEUTROABS 8.7* 7.9* 9.2* 6.0 4.9  HGB 12.0* 11.6* 11.7* 12.7* 11.9*  HCT 37.4* 35.3* 35.9* 37.9* 36.0*  MCV 93.5 92.4 92.3 90.9 91.1  PLT 142* 189 228 254 278   Basic Metabolic Panel:  Recent Labs Lab 12/19/16 0026 12/20/16 0403 12/20/16 0951 12/21/16 0702 12/22/16 0937 12/23/16 0451  NA 142  --  144 145 145 141  K 4.1  --  4.4 3.7 2.9* 3.6  CL 115*  --  116* 114* 110 108  CO2 22  --  22 25 28 26   GLUCOSE 110*  --  152* 128* 95 104*  BUN 26*  --  29* 29* 22* 18  CREATININE 1.22 1.10 1.20 1.03 0.90 0.94  CALCIUM 7.4*  --  7.8* 8.0* 8.0* 7.8*  MG 2.3  --  2.6* 2.4 2.0 1.7  PHOS 3.0  --  2.9 2.4* 3.0 3.4   GFR: Estimated Creatinine Clearance: 56.9 mL/min (by C-G formula based on SCr of 0.94  mg/dL). Liver Function Tests:  Recent Labs Lab 12/19/16 0026 12/20/16 0951 12/21/16 0702 12/22/16 0937 12/23/16 0451  AST 26 45* 36 31 19  ALT 14* 33 34 32 24  ALKPHOS 67 60 58 59 57  BILITOT 1.5* 0.9 0.7 0.8 0.6  PROT 5.1* 5.6* 5.5* 5.6* 5.1*  ALBUMIN 2.4* 2.2* 2.3* 2.4* 2.2*    Recent Labs Lab 12/17/16 1154 12/18/16 0327  LIPASE 32 32   No results for input(s): AMMONIA in the last 168 hours. Coagulation Profile:  Recent Labs Lab 12/17/16 2223  INR 1.17   Cardiac Enzymes: No results for input(s): CKTOTAL, CKMB, CKMBINDEX, TROPONINI in the last 168 hours. BNP (last 3 results) No results for input(s): PROBNP in the last 8760 hours. HbA1C: No results for input(s): HGBA1C in the last 72 hours. CBG: No results for input(s): GLUCAP in the last 168 hours. Lipid Profile: No results for input(s): CHOL, HDL, LDLCALC, TRIG, CHOLHDL, LDLDIRECT in the last 72 hours. Thyroid Function Tests: No results for input(s): TSH, T4TOTAL, FREET4, T3FREE, THYROIDAB in the last 72 hours. Anemia Panel: No results for input(s): VITAMINB12, FOLATE, FERRITIN, TIBC, IRON, RETICCTPCT in the last 72 hours. Sepsis Labs:  Recent Labs Lab 12/17/16 2043 12/17/16 2210 12/18/16 0015 12/18/16 2208 12/19/16 0026  PROCALCITON 2.39  --   --   --   --   LATICACIDVEN  --  1.6 1.9 1.3 1.1    Recent Results (from the past 240 hour(s))  Urine culture     Status: None   Collection Time: 12/17/16 11:54 AM  Result Value Ref Range Status   Specimen Description URINE, RANDOM  Final   Special Requests NONE  Final   Culture   Final    NO GROWTH Performed at [facility], [address] [place], [place] [address]    Report Status 12/18/2016 FINAL  Final  Blood Culture (routine x 2)     Status: None   Collection Time: 12/17/16 12:00 PM  Result Value Ref Range Status   Specimen Description BLOOD RIGHT  WRIST  Final   Special Requests   Final    BOTTLES DRAWN AEROBIC AND ANAEROBIC Blood  Culture adequate volume   Culture   Final    NO GROWTH 5 DAYS Performed at [facility], [address] [place], [place] [address]    Report Status 12/22/2016 FINAL  Final  Blood Culture (routine x 2)     Status: None   Collection Time: 12/17/16  1:20 PM  Result Value Ref Range Status   Specimen Description BLOOD LEFT ANTECUBITAL  Final   Special Requests   Final    BOTTLES DRAWN AEROBIC AND ANAEROBIC Blood Culture adequate volume   Culture   Final    NO GROWTH 5 DAYS Performed at [facility], [address] [place], [place] [address]    Report Status 12/22/2016 FINAL  Final  MRSA PCR Screening     Status: None   Collection Time: 12/17/16 10:36 PM  Result Value Ref Range Status   MRSA by PCR NEGATIVE NEGATIVE Final    Comment:        The GeneXpert MRSA Assay (FDA approved for NASAL specimens only), is one component of a comprehensive MRSA colonization surveillance program. It is not intended to diagnose MRSA infection nor to guide or monitor treatment for MRSA infections.     Radiology Studies: No results found. Scheduled Meds: . acetaminophen  1,000 mg Oral TID  . amLODipine  5 mg Oral Daily   And  . benazepril  20 mg Oral Daily  . aspirin EC  81 mg Oral Daily  . atorvastatin  20 mg Oral QHS  . chlorhexidine  15 mL Mouth Rinse BID  . donepezil  10 mg Oral QHS  . doxazosin  4 mg Oral Daily  . enoxaparin (LOVENOX) injection  40 mg Subcutaneous Q24H  . feeding supplement  237 mL Oral BID BM  . lactulose  20 g Oral Daily  . lip balm  1 application Topical BID  . loratadine  10 mg Oral Daily  . magnesium gluconate  500 mg Oral Daily  . mouth rinse  15 mL Mouth Rinse q12n4p  . niacin  500 mg Oral Daily  . pantoprazole  40 mg Oral Daily  . psyllium  1 packet Oral Daily  . QUEtiapine  75 mg Oral QHS  . rOPINIRole  1 mg Oral QHS  . saccharomyces boulardii  250 mg Oral BID  . sodium chloride flush  3 mL Intravenous Q12H  . sodium chloride flush  3 mL  Intravenous Q12H   Continuous Infusions: . sodium chloride    . methocarbamol (ROBAXIN)  IV    . piperacillin-tazobactam (ZOSYN)  IV 3.375 g (12/23/16 1408)    LOS: 6 days   Merlene Laughter, DO [facility] Pager [phone]  If 7PM-7AM, please contact night-coverage [email or web address] Password [email or web address] 12/23/2016, 3:08 PM

## 2016-12-23 NOTE — Progress Notes (Signed)
CSW contacted [facility] [phone]) and confirmed that patient was a resident in their [name] memory care unit. Staff member Huntley DecSara [phone]) visited hospital to see patient and provided contact information. CSW will assess patient and assist with discharge planning.   Celso SickleKimberly Si Jachim, [place] Clinical Social Worker [facility] Cell#: [phone]

## 2016-12-23 NOTE — Progress Notes (Signed)
Occupational Therapy Evaluation Patient Details Name: Travis MoloneyJohn Wallace MRN: [id number] DOB: 07/05/1930 Today's Date: 12/23/2016    History of Present Illness 83 y.o. male with medical history significant of CAD,  HTN, HLD, dementia . septic 2/2 to Acute Cholecystitis.  Underwent cholecystectomy 12/19/16.    Clinical Impression   PTA, [facility] and was independent with mobility and had S with his self care as needed. Pt demonstrates a significant decline in function, requiring mod A +2 for mobility and Max A with LB ADL. Pt will benefit from OT services to maximize functional level of independence to facilitate DC to [name] for rehab. Daughter in agreement with [name].     Follow Up Recommendations  [name];Supervision/Assistance - 24 hour    Equipment Recommendations  Tub/shower bench;3 in 1 bedside commode    Recommendations for Other Services       Precautions / Restrictions Precautions Precautions: Fall Precaution Comments: jp drain      Mobility Bed Mobility Overal bed mobility: Needs Assistance Bed Mobility: Supine to Sit     Supine to sit: Mod assist;HOB elevated        Transfers Overall transfer level: Needs assistance Equipment used: Rolling walker (2 wheeled) Transfers: Sit to/from [employer]GI CorporationStand;Stand Pivot Transfers Sit to Stand: Min assist;+2 physical assistance Stand pivot transfers: Mod assist;+2 physical assistance       General transfer comment: difficulty with achieving upright posture. Difficulty with sequencing     Balance Overall balance assessment: Needs assistance   Sitting balance-Leahy Scale: Fair       Standing balance-Leahy Scale: Poor                             ADL either performed or assessed with clinical judgement   ADL Overall ADL's : Needs assistance/impaired Eating/Feeding: Supervision/ safety;Set up   Grooming: Supervision/safety;Set up;Sitting   Upper Body Bathing: Minimal assistance;Sitting    Lower Body Bathing: Maximal assistance;Sit to/from stand   Upper Body Dressing : Moderate assistance;Sitting   Lower Body Dressing: Maximal assistance;Sit to/from stand   Toilet Transfer: +2 for physical assistance;Moderate assistance;RW   Toileting- Clothing Manipulation and Hygiene: Moderate assistance       Functional mobility during ADLs: +2 for physical assistance;Moderate assistance       Vision         Perception     Praxis      Pertinent Vitals/Pain Pain Assessment: Faces Faces Pain Scale: Hurts little more Pain Location: abdomen Pain Descriptors / Indicators: Discomfort;Grimacing Pain Intervention(s): Limited activity within patient's tolerance     Hand Dominance Right   Extremity/Trunk Assessment Upper Extremity Assessment Upper Extremity Assessment: Generalized weakness   Lower Extremity Assessment Lower Extremity Assessment: Generalized weakness   Cervical / Trunk Assessment Cervical / Trunk Assessment: Kyphotic   Communication Communication Communication: No difficulties   Cognition Arousal/Alertness: Lethargic Behavior During Therapy: Flat affect Overall Cognitive Status: Impaired/Different from baseline                                 General Comments: history of cognitive impairments.Not at baseline. difficulty with sequencingand motor planningfor automatic tasks. ? visual hallucinations - pt saw a bug in his bed. following 1 step commands with incresed time   General Comments       Exercises     Shoulder Instructions      Home Living  Family/patient expects to be discharged to:: Skilled nursing facility                             Home Equipment: None          Prior Functioning/Environment Level of Independence: Needs assistance  Gait / Transfers Assistance Needed: independent  ADL's / Homemaking Assistance Needed: S   Comments: pt lived in Memory Unit at [place]        OT Problem List:  Decreased strength;Decreased activity tolerance;Impaired balance (sitting and/or standing);Decreased cognition;Decreased safety awareness;Decreased knowledge of use of DME or AE;Cardiopulmonary status limiting activity;Obesity;Pain      OT Treatment/Interventions: Self-care/ADL training;Therapeutic exercise;DME and/or AE instruction;Therapeutic activities;Cognitive remediation/compensation;Patient/family education;Balance training    OT Goals(Current goals can be found in the care plan section) Acute Rehab OT Goals Patient Stated Goal: per famiy toreturn to [name] Time For Goal Achievement: 01/06/17 Potential to Achieve Goals: Good ADL Goals Pt Will Perform Upper Body Bathing: with supervision;sitting Pt Will Perform Lower Body Bathing: with min assist;sit to/from stand Pt Will Transfer to Toilet: with min assist;ambulating;bedside commode Additional ADL Goal #1: Complete bed mobility with S in preparation for ADL  OT Frequency: Min 2X/week   Barriers to D/C:            Co-evaluation PT/OT/SLP Co-Evaluation/Treatment: Yes Reason for Co-Treatment: Necessary to address cognition/behavior during functional activity;To address functional/ADL transfers;For patient/therapist safety   OT goals addressed during session: ADL's and self-care      AM-PAC PT "6 Clicks" Daily Activity     Outcome Measure Help from another person eating meals?: A Little Help from another person taking care of personal grooming?: A Little Help from another person toileting, which includes using toliet, bedpan, or urinal?: A Lot Help from another person bathing (including washing, rinsing, drying)?: A Lot Help from another person to put on and taking off regular upper body clothing?: A Little Help from another person to put on and taking off regular lower body clothing?: A Lot 6 Click Score: 15   End of Session Equipment Utilized During Treatment: Gait belt;Rolling walker;Other (comment) (abdominal binder) Nurse  Communication: Mobility status  Activity Tolerance: Patient tolerated treatment well Patient left: in chair;with call bell/phone within reach;with chair alarm set;with family/visitor present  OT Visit Diagnosis: Unsteadiness on feet (R26.81);Muscle weakness (generalized) (M62.81);Other symptoms and signs involving cognitive function;Pain Pain - part of body:  (abdomen)                Time: 1610-9604 OT Time Calculation (min): 27 min Charges:  OT General Charges $OT Visit: 1 Procedure OT Evaluation $OT Eval Moderate Complexity: 1 Procedure G-Codes:     Ezmeralda Stefanick, OT/L  [phone] 12/23/2016  Arlenne Kimbley,HILLARY 12/23/2016, 12:16 PM

## 2016-12-23 NOTE — Evaluation (Signed)
Physical Therapy Evaluation Patient Details Name: Travis MoloneyJohn Wallace MRN: [id number] DOB: 05/09/1931 Today's Date: 12/23/2016   History of Present Illness  83 y.o. male with medical history significant of CAD,  HTN, HLD, dementia . septic 2/2 to Acute Cholecystitis.  Underwent cholecystectomy 12/19/16.   Clinical Impression  Pt admitted with above diagnosis. Pt currently with functional limitations due to the deficits listed below (see PT Problem List).  Pt will benefit from skilled PT to increase their independence and safety with mobility to allow discharge to the venue listed below. Recommend [name] prior to returning to memory unit.       Follow Up Recommendations [name];Supervision/Assistance - 24 hour    Equipment Recommendations  None recommended by PT    Recommendations for Other Services       Precautions / Restrictions Precautions Precautions: Fall Precaution Comments: jp drain Restrictions Weight Bearing Restrictions: No      Mobility  Bed Mobility Overal bed mobility: Needs Assistance Bed Mobility: Supine to Sit     Supine to sit: Mod assist;HOB elevated     General bed mobility comments: use of bed pad to A with getting hips turned  Transfers Overall transfer level: Needs assistance Equipment used: Rolling walker (2 wheeled) Transfers: Sit to/from [employer]Stand;Stand Pivot Transfers Sit to Stand: Min assist;+2 physical assistance Stand pivot transfers: Mod assist;+2 physical assistance       General transfer comment: difficulty with achieving upright posture. Difficulty with sequencing   Ambulation/Gait Ambulation/Gait assistance: Mod assist;+2 physical assistance Ambulation Distance (Feet): 4 Feet Assistive device: Rolling walker (2 wheeled) Gait Pattern/deviations: Decreased step length - right;Decreased step length - left;Trunk flexed Gait velocity: decreased   General Gait Details: Pt ambulated short distance with RW with A to maneuver RW, then attempted with HHA  since he normally doesn't use RW and has dementia.  Pt unable to take any steps with HHA.  Stairs            Wheelchair Mobility    Modified Rankin (Stroke Patients Only)       Balance Overall balance assessment: Needs assistance Sitting-balance support: Single extremity supported Sitting balance-Leahy Scale: Fair       Standing balance-Leahy Scale: Poor                               Pertinent Vitals/Pain Pain Assessment: Faces Faces Pain Scale: Hurts little more Pain Location: abdomen Pain Descriptors / Indicators: Discomfort;Grimacing Pain Intervention(s): Limited activity within patient's tolerance    Home Living Family/patient expects to be discharged to:: Skilled nursing facility               Home Equipment: None      Prior Function Level of Independence: Needs assistance   Gait / Transfers Assistance Needed: independent   ADL's / Homemaking Assistance Needed: S  Comments: [facility]     Hand Dominance   Dominant Hand: Right    Extremity/Trunk Assessment   Upper Extremity Assessment Upper Extremity Assessment: Defer to OT evaluation    Lower Extremity Assessment Lower Extremity Assessment: Generalized weakness    Cervical / Trunk Assessment Cervical / Trunk Assessment: Kyphotic  Communication   Communication: No difficulties  Cognition Arousal/Alertness: Lethargic Behavior During Therapy: Flat affect Overall Cognitive Status: Impaired/Different from baseline  General Comments: history of cognitive impairments.Not at baseline. difficulty with sequencingand motor planningfor automatic tasks. ? visual hallucinations - pt saw a bug in his bed. following 1 step commands with incresed time      General Comments      Exercises     Assessment/Plan    PT Assessment Patient needs continued PT services  PT Problem List Decreased strength;Decreased  activity tolerance;Decreased balance;Decreased mobility;Decreased knowledge of use of DME;Decreased cognition       PT Treatment Interventions DME instruction;Gait training;Functional mobility training;Therapeutic activities;Therapeutic exercise;Balance training    PT Goals (Current goals can be found in the Care Plan section)  Acute Rehab PT Goals Patient Stated Goal: to [facility] rehab PT Goal Formulation: With family Time For Goal Achievement: 01/06/17 Potential to Achieve Goals: Good    Frequency Min 3X/week   Barriers to discharge        Co-evaluation PT/OT/SLP Co-Evaluation/Treatment: Yes Reason for Co-Treatment: Necessary to address cognition/behavior during functional activity;For patient/therapist safety;To address functional/ADL transfers PT goals addressed during session: Mobility/safety with mobility;Balance OT goals addressed during session: ADL's and self-care       AM-PAC PT "6 Clicks" Daily Activity  Outcome Measure Difficulty turning over in bed (including adjusting bedclothes, sheets and blankets)?: Total Difficulty moving from lying on back to sitting on the side of the bed? : Total Difficulty sitting down on and standing up from a chair with arms (e.g., wheelchair, bedside commode, etc,.)?: Total Help needed moving to and from a bed to chair (including a wheelchair)?: A Lot Help needed walking in hospital room?: A Lot Help needed climbing 3-5 steps with a railing? : Total 6 Click Score: 8    End of Session Equipment Utilized During Treatment: Gait belt Activity Tolerance: Patient limited by lethargy Patient left: in chair;with call bell/phone within reach;with chair alarm set Nurse Communication: Mobility status PT Visit Diagnosis: Unsteadiness on feet (R26.81);Other abnormalities of gait and mobility (R26.89)    Time: 1610-9604 PT Time Calculation (min) (ACUTE ONLY): 26 min   Charges:   PT Evaluation $PT Eval  Moderate Complexity: 1 Procedure     PT G Codes:        Travis Wallace, Travis Wallace 12/23/2016   Travis Wallace 12/23/2016, 12:58 PM

## 2016-12-23 NOTE — Progress Notes (Signed)
[facility] Progress Note  4 Days Post-Op  Subjective: CC: abdominal pain Patient resting comfortably in bed, much less confused this AM.  No BM since yesterday, but not complaining of n/v. Patient with abdominal pain when being moved around. Tolerating diet, ate some of breakfast.   Objective: Vital signs in last 24 hours: Temp:  [97.6 F (36.4 C)-98.9 F (37.2 C)] 98.1 F (36.7 C) (06/26 0622) Pulse Rate:  [41-47] 43 (06/26 0622) Resp:  [18-20] 18 (06/26 0622) BP: (114-155)/(45-56) 114/45 (06/26 0622) SpO2:  [94 %-98 %] 94 % (06/26 0622) Last BM Date: 12/21/16  Intake/Output from previous day: 06/25 0701 - 06/26 0700 In: 290 [P.O.:240; IV Piggyback:50] Out: 120 [Urine:100; Drains:20] Intake/Output this shift: No intake/output data recorded.  PE: Gen:  Alert, NAD, pleasant Card:  Regular rate and rhythm, pedal pulses 2+ BL Pulm:  Normal effort, clear to auscultation bilaterally Abd: Soft, TTP of RUQ, moderately distended, bowel sounds present in all 4 quadrants, incisions C/D/I. RUQ JP drain in place with serosanguinous output.  Skin: warm and dry, no rashes  Psych: A&Ox1, oriented to person.   Lab Results:   Recent Labs  12/22/16 0937 12/23/16 0451  WBC 7.7 6.8  HGB 12.7* 11.9*  HCT 37.9* 36.0*  PLT 254 278   BMET  Recent Labs  12/22/16 0937 12/23/16 0451  NA 145 141  K 2.9* 3.6  CL 110 108  CO2 28 26  GLUCOSE 95 104*  BUN 22* 18  CREATININE 0.90 0.94  CALCIUM 8.0* 7.8*   CMP     Component Value Date/Time   NA 141 12/23/2016 0451   K 3.6 12/23/2016 0451   CL 108 12/23/2016 0451   CO2 26 12/23/2016 0451   GLUCOSE 104 (H) 12/23/2016 0451   BUN 18 12/23/2016 0451   CREATININE 0.94 12/23/2016 0451   CALCIUM 7.8 (L) 12/23/2016 0451   PROT 5.1 (L) 12/23/2016 0451   ALBUMIN 2.2 (L) 12/23/2016 0451   AST 19 12/23/2016 0451   ALT 24 12/23/2016 0451   ALKPHOS 57 12/23/2016 0451   BILITOT 0.6 12/23/2016 0451   GFRNONAA >60 12/23/2016  0451   GFRAA >60 12/23/2016 0451   Lipase     Component Value Date/Time   LIPASE 32 12/18/2016 0327    Anti-infectives: Anti-infectives    Start     Dose/Rate Route Frequency Ordered Stop   12/19/16 1201  piperacillin-tazobactam (ZOSYN) 3.375 GM/50ML IVPB    Comments:  Wylene SimmerShepherd, Karen   : cabinet override      12/19/16 1201 12/19/16 1239   12/17/16 2100  piperacillin-tazobactam (ZOSYN) IVPB 3.375 g     3.375 g 12.5 mL/hr over 240 Minutes Intravenous Every 8 hours 12/17/16 1640 12/24/16 0802   12/17/16 1345  piperacillin-tazobactam (ZOSYN) IVPB 3.375 g     3.375 g 100 mL/hr over 30 Minutes Intravenous  Once 12/17/16 1338 12/17/16 1533   12/17/16 1345  vancomycin (VANCOCIN) 1,500 mg in sodium chloride 0.9 % 500 mL IVPB     1,500 mg 250 mL/hr over 120 Minutes Intravenous  Once 12/17/16 1338 12/17/16 1833       Assessment/Plan Acute gangrenous cholecystitis w/ focal abscess S/P laparoscopic cholecystectomy 12/19/16 Dr. Ezzard StandingNewman - POD#4 - continue IV abx for total of 5 days (end 6/27) - drain with 20 cc serosanguinous output in last 24 hrs - due to high risk of bile leak in this patient, continue drain until discharge - SOFT diet - ambulate, OOB Severe Sepsis - WBC 6.8,  afebrile. Sepsis physiology improved.  CAD s/p CABG HTN OSA Dementia Sinus bradycardia - cardiology signed off, recommend avoidance of memantine. HD stable Hyperbilirubinemia - trending down AKI - Cr 0.94 today, improving  FEN - SOFT diet VTE - SCDs, lovenox ID - IV Zosyn (6/20>>)  Plan: Continue RUQ drain. Continue IV abx. Continue to try to mobilize and get patient OOB.    LOS: 6 days    Wells Guiles , [facility] 12/23/2016, 8:45 AM Pager: [phone] Consults: [phone] Mon-Fri 7:00 am-4:30 pm Sat-Sun 7:00 am-11:30 am

## 2016-12-24 LAB — COMPREHENSIVE METABOLIC PANEL
ALT: 24 U/L (ref 17–63)
AST: 24 U/L (ref 15–41)
Albumin: 2.5 g/dL — ABNORMAL LOW (ref 3.5–5.0)
Alkaline Phosphatase: 58 U/L (ref 38–126)
Anion gap: 8 (ref 5–15)
BUN: 15 mg/dL (ref 6–20)
CHLORIDE: 107 mmol/L (ref 101–111)
CO2: 26 mmol/L (ref 22–32)
CREATININE: 1.02 mg/dL (ref 0.61–1.24)
Calcium: 8.1 mg/dL — ABNORMAL LOW (ref 8.9–10.3)
GFR calc Af Amer: >60 mL/min (ref >60)
GFR calc non Af Amer: >60 mL/min (ref >60)
GLUCOSE: 97 mg/dL (ref 65–99)
Potassium: 3.3 mmol/L — ABNORMAL LOW (ref 3.5–5.1)
SODIUM: 141 mmol/L (ref 135–145)
Total Bilirubin: 0.7 mg/dL (ref 0.3–1.2)
Total Protein: 5.7 g/dL — ABNORMAL LOW (ref 6.5–8.1)

## 2016-12-24 LAB — CBC WITH DIFFERENTIAL/PLATELET
BASOS ABS: 0 10*3/uL (ref 0.0–0.1)
Basophils Relative: 0 %
EOS ABS: 0.1 10*3/uL (ref 0.0–0.7)
EOS PCT: 2 %
HCT: 36.6 % — ABNORMAL LOW (ref 39.0–52.0)
Hemoglobin: 12.5 g/dL — ABNORMAL LOW (ref 13.0–17.0)
LYMPHS PCT: 18 %
Lymphs Abs: 1.3 10*3/uL (ref 0.7–4.0)
MCH: 30.3 pg (ref 26.0–34.0)
MCHC: 34.2 g/dL (ref 30.0–36.0)
MCV: 88.6 fL (ref 78.0–100.0)
Monocytes Absolute: 0.5 10*3/uL (ref 0.1–1.0)
Monocytes Relative: 7 %
Neutro Abs: 5.3 10*3/uL (ref 1.7–7.7)
Neutrophils Relative %: 73 %
PLATELETS: 288 10*3/uL (ref 150–400)
RBC: 4.13 MIL/uL — AB (ref 4.22–5.81)
RDW: 12.6 % (ref 11.5–15.5)
WBC: 7.2 10*3/uL (ref 4.0–10.5)

## 2016-12-24 LAB — MAGNESIUM: Magnesium: 2.1 mg/dL (ref 1.7–2.4)

## 2016-12-24 LAB — PHOSPHORUS: Phosphorus: 3.2 mg/dL (ref 2.5–4.6)

## 2016-12-24 MED ORDER — AMOXICILLIN-POT CLAVULANATE 875-125 MG PO TABS
1.0000 | ORAL_TABLET | Freq: Two times a day (BID) | ORAL | Status: DC
  Administered 2016-12-24: 1 via ORAL
  Filled 2016-12-24: qty 1

## 2016-12-24 MED ORDER — AMOXICILLIN-POT CLAVULANATE 875-125 MG PO TABS
1.0000 | ORAL_TABLET | Freq: Two times a day (BID) | ORAL | Status: DC
  Administered 2016-12-25: 1 via ORAL
  Filled 2016-12-24: qty 1

## 2016-12-24 MED ORDER — POTASSIUM CHLORIDE CRYS ER 20 MEQ PO TBCR
40.0000 meq | EXTENDED_RELEASE_TABLET | Freq: Once | ORAL | Status: AC
  Administered 2016-12-24: 40 meq via ORAL
  Filled 2016-12-24: qty 2

## 2016-12-24 NOTE — Progress Notes (Signed)
PROGRESS NOTE   Travis MoloneyJohn Wallace  ZOX:[id number]    DOB: 11/02/1930    DOA: 12/17/2016  PCP: [name], [name], PA-C   I have briefly reviewed patients previous medical records in [facility].  Brief Narrative:  83 year old male with a PMH of Alzheimer's dementia, CAD, HLD, HTN, GERD, depression, presented with 1-2 days history of worsening confusion, lethargy and fevers. She had been [facility] 5 days prior to admission for chest pain and found to have gallbladder sludge but no acute cholecystitis. Admitted for sepsis secondary to acute cholecystitis, after cardiology clearance, he underwent surgery. Improved.   Assessment & Plan:   Active Problems:   Acute gangrenous cholecystitis s/p lap cholecystectomy 12/19/2016   Cholangitis   Severe sepsis (HCC)   CAD (coronary artery disease)   HTN (hypertension)   OSA (obstructive sleep apnea)   Sepsis (HCC)   Dementia   Altered mental status   Sinus bradycardia   Hyperbilirubinemia   AKI (acute kidney injury) (HCC)   Pericholecystic abscess s/p OR drainage 12/19/2016   Delirium   Alzheimer's dementia   Hypertension   GERD (gastroesophageal reflux disease)   Sleep apnea   Restless leg   Insomnia   1. Acute gangrenous cholecystitis with focal abscess, status post laparoscopic cholecystectomy: Surgery follow-up appreciated. Status post laparoscopic cholecystectomy 12/19/16 by Dr. Ezzard StandingNewman. Surgery recommends completing 5 days of IV antibiotics on 6/27 and then DC on oral Augmentin 10 days. Drain removed 6/27. Soft diet and mobilize. Surgery has cleared him for discharge and needs outpatient follow-up with Dr. Ezzard StandingNewman in 2-3 weeks. 2. Severe sepsis: Secondary to problem #1. Sepsis was present on admission. Sepsis physiology resolved. 3. Lactic acidosis: Secondary to sepsis. Resolved. 4. CAD status post CABG: Stable without chest pain. Continue aspirin and atorvastatin. 5. Essential hypertension: Continue with amlodipine,  benazepril and doxazosin. Controlled. 6. Asymptomatic sinus bradycardia: 2-D echo showed LVEF 55-60 percent and grade 2 diastolic dysfunction. Cardiology input appreciated. No indication for pacemaker at this time. Recommend avoiding Namenda which can slow rate. Cardiology signed off. Bradycardia is improved or even resolved. 7. OSA: Patient does not tolerate CPAP. 8. Alzheimer's dementia: Probably advanced. Continue Aricept. Also on Seroquel. 9. Acute kidney injury: Likely related to sepsis. Resolved. 10. GERD: PPI. 11. RLS: Continue ropinirole. 12. HLD: Continue statins. 13. Hypokalemia: Replace and follow. Magnesium normal.   DVT prophylaxis: Lovenox Code Status: Full Family Communication: None at bedside Disposition: DC to [name] possibly 6/28.   Consultants:  General surgery Cardiology   Procedures:  Laparoscopic cholecystectomy  Antimicrobials:  As above    Subjective: Pleasantly confused. Denies pain. Oriented only to self. As per nursing, no acute issues reported.  ROS: Unable to obtain.  Objective:  Vitals:   12/23/16 0622 12/23/16 1411 12/24/16 0513 12/24/16 1427  BP: (!) 114/45 121/65 (!) 145/53 (!) 147/61  Pulse: (!) 43 (!) 49 68 65  Resp: 18 18 18 18   Temp: 98.1 F (36.7 C) 98.2 F (36.8 C) 98.8 F (37.1 C) 98.1 F (36.7 C)  TempSrc: Oral Oral Oral Oral  SpO2: 94% 97% 97% 97%  Weight:      Height:        Examination:  General exam: Pleasant elderly male lying comfortably propped up in bed. Respiratory system: Clear to auscultation. Respiratory effort normal. Cardiovascular system: S1 & S2 heard, RRR. No JVD, murmurs, rubs, gallops or clicks. No pedal edema. Telemetry: Mostly sinus rhythm in the 60s. Occasional sinus bradycardia >55. Gastrointestinal system: Abdomen  is nondistended, soft and nontender. No organomegaly or masses felt. Normal bowel sounds heard. Central nervous system: Alert and oriented only to self. No focal neurological  deficits. Extremities: Symmetric 5 x 5 power. Skin: No rashes, lesions or ulcers Psychiatry: Judgement and insight appear normal. Mood & affect flat.    Data Reviewed: I have personally reviewed following labs and imaging studies  CBC:  Recent Labs Lab 12/20/16 0951 12/21/16 0359 12/22/16 0937 12/23/16 0451 12/24/16 0524  WBC 8.8 10.5 7.7 6.8 7.2  NEUTROABS 7.9* 9.2* 6.0 4.9 5.3  HGB 11.6* 11.7* 12.7* 11.9* 12.5*  HCT 35.3* 35.9* 37.9* 36.0* 36.6*  MCV 92.4 92.3 90.9 91.1 88.6  PLT 189 228 254 278 288   Basic Metabolic Panel:  Recent Labs Lab 12/20/16 0951 12/21/16 0702 12/22/16 0937 12/23/16 0451 12/24/16 0524  NA 144 145 145 141 141  K 4.4 3.7 2.9* 3.6 3.3*  CL 116* 114* 110 108 107  CO2 22 25 28 26 26   GLUCOSE 152* 128* 95 104* 97  BUN 29* 29* 22* 18 15  CREATININE 1.20 1.03 0.90 0.94 1.02  CALCIUM 7.8* 8.0* 8.0* 7.8* 8.1*  MG 2.6* 2.4 2.0 1.7 2.1  PHOS 2.9 2.4* 3.0 3.4 3.2   Liver Function Tests:  Recent Labs Lab 12/20/16 0951 12/21/16 0702 12/22/16 0937 12/23/16 0451 12/24/16 0524  AST 45* 36 31 19 24   ALT 33 34 32 24 24  ALKPHOS 60 58 59 57 58  BILITOT 0.9 0.7 0.8 0.6 0.7  PROT 5.6* 5.5* 5.6* 5.1* 5.7*  ALBUMIN 2.2* 2.3* 2.4* 2.2* 2.5*   Coagulation Profile:  Recent Labs Lab 12/17/16 2223  INR 1.17     Recent Results (from the past 240 hour(s))  Urine culture     Status: None   Collection Time: 12/17/16 11:54 AM  Result Value Ref Range Status   Specimen Description URINE, RANDOM  Final   Special Requests NONE  Final   Culture   Final    NO GROWTH Performed at [facility], [address] [place], [place] [address]    Report Status 12/18/2016 FINAL  Final  Blood Culture (routine x 2)     Status: None   Collection Time: 12/17/16 12:00 PM  Result Value Ref Range Status   Specimen Description BLOOD RIGHT WRIST  Final   Special Requests   Final    BOTTLES DRAWN AEROBIC AND ANAEROBIC Blood Culture adequate volume   Culture    Final    NO GROWTH 5 DAYS Performed at [facility], [address], [place], [place] [address]    Report Status 12/22/2016 FINAL  Final  Blood Culture (routine x 2)     Status: None   Collection Time: 12/17/16  1:20 PM  Result Value Ref Range Status   Specimen Description BLOOD LEFT ANTECUBITAL  Final   Special Requests   Final    BOTTLES DRAWN AEROBIC AND ANAEROBIC Blood Culture adequate volume   Culture   Final    NO GROWTH 5 DAYS Performed at [facility], [address], [place], [place] [address]    Report Status 12/22/2016 FINAL  Final  MRSA PCR Screening     Status: None   Collection Time: 12/17/16 10:36 PM  Result Value Ref Range Status   MRSA by PCR NEGATIVE NEGATIVE Final    Comment:        The GeneXpert MRSA Assay (FDA approved for NASAL specimens only), is one component of  a comprehensive MRSA colonization surveillance program. It is not intended to diagnose MRSA infection nor to guide or monitor treatment for MRSA infections.          Radiology Studies: No results found.      Scheduled Meds: . acetaminophen  1,000 mg Oral TID  . amLODipine  5 mg Oral Daily   And  . benazepril  20 mg Oral Daily  . amoxicillin-clavulanate  1 tablet Oral Q12H  . aspirin EC  81 mg Oral Daily  . atorvastatin  20 mg Oral QHS  . chlorhexidine  15 mL Mouth Rinse BID  . donepezil  10 mg Oral QHS  . doxazosin  4 mg Oral Daily  . enoxaparin (LOVENOX) injection  40 mg Subcutaneous Q24H  . feeding supplement  237 mL Oral BID BM  . lactulose  20 g Oral Daily  . lip balm  1 application Topical BID  . loratadine  10 mg Oral Daily  . magnesium gluconate  500 mg Oral Daily  . mouth rinse  15 mL Mouth Rinse q12n4p  . niacin  500 mg Oral Daily  . pantoprazole  40 mg Oral Daily  . psyllium  1 packet Oral Daily  . QUEtiapine  75 mg Oral QHS  . rOPINIRole  1 mg Oral QHS  . saccharomyces boulardii  250 mg Oral BID  . sodium chloride flush  3 mL Intravenous Q12H   . sodium chloride flush  3 mL Intravenous Q12H   Continuous Infusions: . sodium chloride    . methocarbamol (ROBAXIN)  IV       LOS: 7 days     Sarayah Bacchi, MD, FACP, FHM. [facility] Pager [phone]  If 7PM-7AM, please contact night-coverage [email or web address] Password [email or web address] 12/24/2016, 5:50 PM

## 2016-12-24 NOTE — Progress Notes (Signed)
[facility] Progress Note  5 Days Post-Op  Subjective: CC: abdominal pain Patient resting comfortably in bed, states he feels ok. Tolerating diet.   Objective: Vital signs in last 24 hours: Temp:  [98.2 F (36.8 C)-98.8 F (37.1 C)] 98.8 F (37.1 C) (06/27 0513) Pulse Rate:  [49-68] 68 (06/27 0513) Resp:  [18] 18 (06/27 0513) BP: (121-145)/(53-65) 145/53 (06/27 0513) SpO2:  [97 %] 97 % (06/27 0513) Last BM Date: 12/23/16  Intake/Output from previous day: 06/26 0701 - 06/27 0700 In: 560 [P.O.:360; IV Piggyback:200] Out: 910 [Urine:900; Drains:10] Intake/Output this shift: No intake/output data recorded.  PE: Gen:  Alert, NAD, pleasant Card:  Regular rate and rhythm, pedal pulses 2+ BL Pulm:  Normal effort, clear to auscultation bilaterally Abd: Soft, non-tender, non-distended, bowel sounds present in all 4 quadrants, no HSM, incisions C/D/I. RUQ drain with serosanguinous output.  Skin: warm and dry, no rashes  Psych: A&Ox3   Lab Results:   Recent Labs  12/23/16 0451 12/24/16 0524  WBC 6.8 7.2  HGB 11.9* 12.5*  HCT 36.0* 36.6*  PLT 278 288   BMET  Recent Labs  12/23/16 0451 12/24/16 0524  NA 141 141  K 3.6 3.3*  CL 108 107  CO2 26 26  GLUCOSE 104* 97  BUN 18 15  CREATININE 0.94 1.02  CALCIUM 7.8* 8.1*   CMP     Component Value Date/Time   NA 141 12/24/2016 0524   K 3.3 (L) 12/24/2016 0524   CL 107 12/24/2016 0524   CO2 26 12/24/2016 0524   GLUCOSE 97 12/24/2016 0524   BUN 15 12/24/2016 0524   CREATININE 1.02 12/24/2016 0524   CALCIUM 8.1 (L) 12/24/2016 0524   PROT 5.7 (L) 12/24/2016 0524   ALBUMIN 2.5 (L) 12/24/2016 0524   AST 24 12/24/2016 0524   ALT 24 12/24/2016 0524   ALKPHOS 58 12/24/2016 0524   BILITOT 0.7 12/24/2016 0524   GFRNONAA >60 12/24/2016 0524   GFRAA >60 12/24/2016 0524   Lipase     Component Value Date/Time   LIPASE 32 12/18/2016 0327    Anti-infectives: Anti-infectives    Start     Dose/Rate Route  Frequency Ordered Stop   12/19/16 1201  piperacillin-tazobactam (ZOSYN) 3.375 GM/50ML IVPB    Comments:  Wylene Simmer   : cabinet override      12/19/16 1201 12/19/16 1239   12/17/16 2100  piperacillin-tazobactam (ZOSYN) IVPB 3.375 g     3.375 g 12.5 mL/hr over 240 Minutes Intravenous Every 8 hours 12/17/16 1640 12/24/16 0802   12/17/16 1345  piperacillin-tazobactam (ZOSYN) IVPB 3.375 g     3.375 g 100 mL/hr over 30 Minutes Intravenous  Once 12/17/16 1338 12/17/16 1533   12/17/16 1345  vancomycin (VANCOCIN) 1,500 mg in sodium chloride 0.9 % 500 mL IVPB     1,500 mg 250 mL/hr over 120 Minutes Intravenous  Once 12/17/16 1338 12/17/16 1833       Assessment/Plan Acute gangrenous cholecystitis w/ focal abscess S/P laparoscopic cholecystectomy 12/19/16 Dr. Ezzard Standing - POD#5 - continue IV abx for total of 5 days (end 6/27). D/c on PO augmentin x10 days - drain with 10 cc serosanguinousoutput in last 24 hrs - removed  - SOFT diet - ambulate, OOB Severe Sepsis- Sepsis physiology resolved.  CAD s/p CABG HTN OSA Dementia Sinus bradycardia- cardiology signed off, recommend avoidance of memantine. HD stable Hyperbilirubinemia- trending down AKI- stable  FEN - SOFT diet VTE- SCDs, lovenox ID- IV Zosyn (6/20> 6/27). PO augementin  x 10 days (6/27>)  Plan: RUQ drain removed. Convert to PO augmentin x10 days at discharge. Continue to try to mobilize and get patient OOB. From a surgical standpoint, patient is stable for discharge to [name].  Patient will need to f/u with Dr. Ezzard StandingNewman in 2-3 weeks.   LOS: 7 days    Wells GuilesKelly Rayburn , [facility] 12/24/2016, 8:20 AM Pager: [phone] Consults: [phone] Mon-Fri 7:00 am-4:30 pm Sat-Sun 7:00 am-11:30 am

## 2016-12-24 NOTE — Clinical Social Work Note (Signed)
Clinical Social Work Assessment  Patient Details  Name: Travis Wallace MRN: [id number] Date of Birth: 03/26/1931  Date of referral:  12/24/16               Reason for consult:  Facility Placement                Permission sought to share [name] with:  Family Supports, Magazine features editoracility Contact Representative Permission granted to share information::  Yes, Verbal Permission Granted  Name::     Daughter/legal guardian Hollie BeachDeena Kersting [phone]  Agency::  [phone] [name], Memory Care unit  Relationship::     Contact Information:     Housing/Transportation Living arrangements for the past 2 months:  Assisted Living Facility Source of Information:  Adult Children, Facility Patient Interpreter Needed:  None Criminal Activity/Legal Involvement Pertinent to Current Situation/Hospitalization:  No - Comment as needed Significant Relationships:  Adult Children, Community Support Lives with:  Facility Resident Do you feel safe going back to the place where you live?  Yes Need for family participation in patient care:  Yes (Comment) (daughter legal guardian )  Care giving concerns:  [facility] [place]. Daughter reports he has lived there since [name] and "is doing really well." Lived with daughter prior to that. At baseline needs prompts to toilet but does so independently, feeds and dresses independently, and gets supervision to shower but does not require assistance. Ambulates independently without use of DME.  Daughter agrees to recommendation that pt may benefit from Skilled PT in [name] at [place]. Spoke with facility staff who confirms above information.   Social Worker assessment / plan:  CSW consulted as pt is [name]. [name] recommended at [place] and daughter (legal guardian) in agreement. Preference for but not limited to [name].  Facility aware of plan for rehab at  [place] prior to returning home to his [name].  Obtained PASSR and referred to area facilities, will follow up with daughter for bed offers. Pt had mittens removed today.  Plan: [name] for rehab at [place].  Employment status:  Retired Health and safety inspectornsurance information:  Medicare PT Recommendations:  Skilled Nursing Facility Information / Referral to community resources:  Skilled Nursing Facility  Patient/Family's Response to care:  Pt has dementia and is pleasant but not oriented to situation, does not have response to care at time of assessment. Daughter appreciative of care  Patient/Family's Understanding of and Emotional Response to Diagnosis, Current Treatment, and Prognosis:  See above, pt does not demonstrate understanding due to cognitive limitations of dementia. Does express he understands where he is and that he will go "somewhere else" before home to [name] at [place]. Daughter demonstrates thorough understanding of plan and positive outlook toward her father's goals of care.   Emotional Assessment Appearance:  Appears stated age Attitude/Demeanor/Rapport:   (pleasant) Affect (typically observed):  Calm Orientation:  Oriented to Self, Oriented to Place Alcohol / Substance use:  Not Applicable Psych involvement (Current and /or in the community):  No (Comment)  Discharge Needs  Concerns to be addressed:  Discharge Planning Concerns Readmission within the last 30 days:  No Current discharge risk:  Dependent with Mobility Barriers to Discharge:  Cont'd medical work up  Terex CorporationMeghan R Tamura Lasky, LCSW 12/24/2016, 11:05 AM  [phone]

## 2016-12-25 MED ORDER — ACETAMINOPHEN 325 MG PO TABS: 650.0000 mg | ORAL_TABLET | Freq: Four times a day (QID) | ORAL | Status: DC | PRN

## 2016-12-25 MED ORDER — PSYLLIUM 95 % PO PACK: 1.0000 | PACK | Freq: Every day | ORAL | Status: DC

## 2016-12-25 MED ORDER — AMOXICILLIN-POT CLAVULANATE 875-125 MG PO TABS: 1.0000 | ORAL_TABLET | Freq: Two times a day (BID) | ORAL | Status: DC

## 2016-12-25 NOTE — Discharge Instructions (Signed)
Call to confirm appointment time, please arrive at least 30 min before your appointment to complete your check in paperwork.  If you are unable to arrive 30 min prior to your appointment time we may have to cancel or reschedule you.  LAPAROSCOPIC SURGERY: POST OP INSTRUCTIONS  1. DIET: Follow a light bland diet the first 24 hours after arrival home, such as soup, liquids, crackers, etc. Be sure to include lots of fluids daily. Avoid fast food or heavy meals as your are more likely to get nauseated. Eat a low fat the next few days after surgery.  2. Take your usually prescribed home medications unless otherwise directed. 3. PAIN CONTROL:  1. Pain is best controlled by a usual combination of three different methods TOGETHER:  1. Ice/Heat 2. Over the counter pain medication 3. Prescription pain medication 2. Most patients will experience some swelling and bruising around the incisions. Ice packs or heating pads (30-60 minutes up to 6 times a day) will help. Use ice for the first few days to help decrease swelling and bruising, then switch to heat to help relax tight/sore spots and speed recovery. Some people prefer to use ice alone, heat alone, alternating between ice & heat. Experiment to what works for you. Swelling and bruising can take several weeks to resolve.  3. It is helpful to take an over-the-counter pain medication regularly for the first few weeks. Choose one of the following that works best for you:  1. Naproxen (Aleve, etc) Two 220mg  tabs twice a day 2. Ibuprofen (Advil, etc) Three 200mg  tabs four times a day (every meal & bedtime) 3. Acetaminophen (Tylenol, etc) 500-650mg  four times a day (every meal & bedtime) 4. A prescription for pain medication (such as oxycodone, hydrocodone, etc) should be given to you upon discharge. Take your pain medication as prescribed.  1. If you are having problems/concerns with the prescription medicine (does not control pain, nausea, vomiting, rash,  itching, etc), please call us [phone] to see if we need to switch you to a different pain medicine that will work better for you and/or control your side effect better. 2. If you need a refill on your pain medication, please contact your pharmacy. They will contact our office to request authorization. Prescriptions will not be filled after 5 pm or on week-ends. 4. Avoid getting constipated. Between the surgery and the pain medications, it is common to experience some constipation. Increasing fluid intake and taking a fiber supplement (such as Metamucil, Citrucel, FiberCon, MiraLax, etc) 1-2 times a day regularly will usually help prevent this problem from occurring. A mild laxative (prune juice, Milk of Magnesia, MiraLax, etc) should be taken according to package directions if there are no bowel movements after 48 hours.  5. Watch out for diarrhea. If you have many loose bowel movements, simplify your diet to bland foods & liquids for a few days. Stop any stool softeners and decrease your fiber supplement. Switching to mild anti-diarrheal medications (Kayopectate, Pepto Bismol) can help. If this worsens or does not improve, please call us. 6. Wash / shower every day. You may shower over the dressings as they are waterproof. Continue to shower over incision(s) after the dressing is off. 7. Remove your waterproof bandages 5 days after surgery. You may leave the incision open to air. You may replace a dressing/Band-Aid to cover the incision for comfort if you wish.  8. ACTIVITIES as tolerated:  1. You may resume regular (light) daily activities beginning the next day--such as  daily self-care, walking, climbing stairs--gradually increasing activities as tolerated. If you can walk 30 minutes without difficulty, it is safe to try more intense activity such as jogging, treadmill, bicycling, low-impact aerobics, swimming, etc. 2. Save the most intensive and strenuous activity for last such as sit-ups, heavy  lifting, contact sports, etc Refrain from any heavy lifting or straining until you are off narcotics for pain control.  3. DO NOT PUSH THROUGH PAIN. Let pain be your guide: If it hurts to do something, don't do it. Pain is your body warning you to avoid that activity for another week until the pain goes down. 4. You may drive when you are no longer taking prescription pain medication, you can comfortably wear a seatbelt, and you can safely maneuver your car and apply brakes. 5. You may have sexual intercourse when it is comfortable.  9. FOLLOW UP in our office  1. Please call [facility] at [phone] to set up an appointment to see your surgeon in the office for a follow-up appointment approximately 2-3 weeks after your surgery. 2. Make sure that you call for this appointment the day you arrive home to insure a convenient appointment time.      10. IF YOU HAVE DISABILITY OR FAMILY LEAVE FORMS, BRING THEM TO THE               OFFICE FOR PROCESSING.   WHEN TO CALL US [phone]) [phone]:  1. Poor pain control 2. Reactions / problems with new medications (rash/itching, nausea, etc)  3. Fever over 101.5 F (38.5 C) 4. Inability to urinate 5. Nausea and/or vomiting 6. Worsening swelling or bruising 7. Continued bleeding from incision. 8. Increased pain, redness, or drainage from the incision  The clinic staff is available to answer your questions during regular business hours (8:30am-5pm). Please dont hesitate to call and ask to speak to one of our nurses for clinical concerns.  If you have a medical emergency, go to the nearest emergency room or call 911.  A surgeon from [facility] is always on call at the [facility]   [facility], [place]  [address], [address], [place], [place] [address] ?  MAIN: [phone] ? TOLL FREE: [phone] ?  FAX [phone](336) [phone]  [email or web address]

## 2016-12-25 NOTE — Discharge Summary (Signed)
Physician Discharge Summary  Travis Wallace ZOX:[id number] DOB: Feb 16, 1931  PCP: Doreen Salvage, PA-C  Admit date: 12/17/2016 Discharge date: 12/25/2016  Recommendations for Outpatient Follow-up:  1. M.D. at [name] in 4 days with repeat labs (CBC & BMP). 2. Dr. Ovidio Kin, Gen. Surgery in 2-3 weeks. [name] to arrange follow-up. 3. Doreen Salvage, PA-C/PCP, upon discharge from [name].  Home Health: None Equipment/Devices: None    Discharge Condition: Improved and stable  CODE STATUS: Full  Diet recommendation: Heart healthy diet.  Discharge Diagnoses:  Active Problems:   Acute gangrenous cholecystitis s/p lap cholecystectomy 12/19/2016   Cholangitis   Severe sepsis (HCC)   CAD (coronary artery disease)   HTN (hypertension)   OSA (obstructive sleep apnea)   Sepsis (HCC)   Dementia   Altered mental status   Sinus bradycardia   Hyperbilirubinemia   AKI (acute kidney injury) (HCC)   Pericholecystic abscess s/p OR drainage 12/19/2016   Delirium   Alzheimer's dementia   Hypertension   GERD (gastroesophageal reflux disease)   Sleep apnea   Restless leg   Insomnia   Brief Summary: 83 year old male with a PMH of Alzheimer's dementia, CAD, HLD, HTN, GERD, depression, presented with 1-2 days history of worsening confusion, lethargy and fevers. She had been evaluated at [facility] 5 days prior to admission for chest pain and found to have gallbladder sludge but no acute cholecystitis. Admitted for sepsis secondary to acute cholecystitis.   Assessment & Plan:   1. Acute gangrenous cholecystitis with focal abscess, status post laparoscopic cholecystectomy 6/22: Surgery was consulted. After preop cardiology clearance, patient underwent laparoscopic cholecystectomy 12/19/16 by Dr. Ezzard Standing. He completed 5 days of IV antibiotics on 6/27. As per general surgery, recommend additional 10 days of oral Augmentin through 01/03/17. Drain removed 6/27. Patient is tolerating diet without abdominal pain,  nausea or vomiting. Having BMs. Surgery has cleared him for discharge and needs outpatient follow-up with Dr. Ezzard Standing in 2-3 weeks. Surgical pathology shows acute on chronic cholecystitis with necrosis and no malignancy. 2. Severe sepsis: Secondary to problem #1. Sepsis was present on admission. Sepsis physiology resolved. Urine culture, blood cultures 2: Negative. 3. Lactic acidosis: Secondary to sepsis. Resolved. 4. CAD status post CABG: Stable without chest pain. Continue aspirin and atorvastatin. Cardiology evaluated patient for preoperative clearance. 5. Essential hypertension: Continue with amlodipine, benazepril and doxazosin. Controlled. 6. Asymptomatic sinus bradycardia: 2-D echo showed LVEF 55-60 percent and grade 2 diastolic dysfunction. Cardiology input appreciated. No indication for pacemaker at this time. Recommend avoiding Namenda which can slow rate. Cardiology signed off. Patient was not on Aricept PTA and had been started in the hospital but will be discontinued at discharge. Improved. SB in the high 50s-SR in the 60s. TSH normal at 1.394. 7. OSA: Patient does not tolerate CPAP. 8. Alzheimer's dementia: Probably advanced. Patient was only on Seroquel at [name] and will be continued. 9. Acute kidney injury: Likely related to sepsis. Resolved. 10. GERD: PPI. 11. RLS: Continue ropinirole. 12. HLD: Continue statins. 13. Hypokalemia: Replaced. Magnesium normal. Follow BMP in a couple of days at [facility].   Consultants:  General surgery Cardiology   Procedures:   Laparoscopic cholecystectomy and intraoperative cholangiogram on 6/22.  2-D echo 12/20/16: Study Conclusions  - Left ventricle: The cavity size was normal. Systolic function was   normal. The estimated ejection fraction was in the range of 55%   to 60%. Wall motion was normal; there were no regional wall   motion abnormalities. Features are consistent with a  pseudonormal   left ventricular filling pattern, with  concomitant abnormal   relaxation and increased filling pressure (grade 2 diastolic   dysfunction). Doppler parameters are consistent with high   ventricular filling pressure. - Aortic valve: Transvalvular velocity was within the normal range.   There was no stenosis. There was no regurgitation. - Mitral valve: Transvalvular velocity was within the normal range.   There was no evidence for stenosis. There was no regurgitation. - Right ventricle: The cavity size was normal. Wall thickness was   normal. Systolic function was normal. - Tricuspid valve: There was no regurgitation.   Surgical pathology 12/19/16: Diagnosis Gallbladder - ACUTE AND CHRONIC CHOLECYSTITIS WITH NECROSIS AND CHOLELITHIASIS. - THERE IS NO EVIDENCE OF MALIGNANCY.   Discharge Instructions  Discharge Instructions    Call MD for:  difficulty breathing, headache or visual disturbances    Complete by:  As directed    Call MD for:  extreme fatigue    Complete by:  As directed    Call MD for:  persistant dizziness or light-headedness    Complete by:  As directed    Call MD for:  persistant nausea and vomiting    Complete by:  As directed    Call MD for:  redness, tenderness, or signs of infection (pain, swelling, redness, odor or green/yellow discharge around incision site)    Complete by:  As directed    Call MD for:  severe uncontrolled pain    Complete by:  As directed    Call MD for:  temperature >100.4    Complete by:  As directed    Diet - low sodium heart healthy    Complete by:  As directed    Increase activity slowly    Complete by:  As directed        Medication List    STOP taking these medications   traMADol 50 MG tablet Commonly known as:  ULTRAM     TAKE these medications   acetaminophen 325 MG tablet Commonly known as:  TYLENOL Take 2 tablets (650 mg total) by mouth every 6 (six) hours as needed for mild pain, moderate pain or fever.   amLODipine-benazepril 5-20 MG capsule Commonly  known as:  LOTREL Take 1 capsule by mouth daily.   amoxicillin-clavulanate 875-125 MG tablet Commonly known as:  AUGMENTIN Take 1 tablet by mouth every 12 (twelve) hours. Discontinue after 01/03/2017 doses.   aspirin EC 81 MG tablet Take 81 mg by mouth daily.   atorvastatin 20 MG tablet Commonly known as:  LIPITOR Take 20 mg by mouth at bedtime.   cetirizine 10 MG tablet Commonly known as:  ZYRTEC Take 10 mg by mouth daily.   doxazosin 4 MG tablet Commonly known as:  CARDURA Take 4 mg by mouth daily.   lactulose 10 GM/15ML solution Commonly known as:  CHRONULAC Take 30 mLs by mouth daily.   lansoprazole 30 MG capsule Commonly known as:  PREVACID Take 30 mg by mouth daily.   magnesium gluconate 500 MG tablet Commonly known as:  MAGONATE Take 500 mg by mouth daily.   niacin 500 MG tablet Take 500 mg by mouth daily.   psyllium 95 % Pack Commonly known as:  HYDROCIL/METAMUCIL Take 1 packet by mouth daily. Start taking on:  12/26/2016   QUEtiapine 25 MG tablet Commonly known as:  SEROQUEL Take 25 mg by mouth daily as needed (restlessness).   QUEtiapine 25 MG tablet Commonly known as:  SEROQUEL Take 75 mg by mouth  at bedtime.   REQUIP 1 MG tablet Generic drug:  rOPINIRole Take 1 mg by mouth at bedtime.       Contact information for follow-up providers    Ovidio Kin, MD Follow up.   Specialty:  General Surgery Why:  Call to make an appointment to see Dr. Ezzard Standing in 2-3 weeks. Please plan to arrive 30 min prior to appointment time. Bring photo ID and insurance information.  Contact information: [address] [place] [address] [phone]        M.D. at [name]. Schedule an appointment as soon as possible for a visit in 4 day(s).   Why:  To be seen with repeat labs (CBC & BMP).       Bulla, [name], PA-C Follow up.   Specialty:  Internal Medicine Why:  Upon discharge from [name]. Contact information: [address] [place]  [address] [phone]            Contact information for after-discharge care    Destination    [name] Follow up.   Specialty:  Skilled Nursing Facility Contact information: [address] [place] [address] [phone]                 Allergies  Allergen Reactions  . Memantine Other (See Comments)    BRADYCARDIA      Procedures/Studies: Dg Chest 2 View  Result Date: 12/17/2016 CLINICAL DATA:  Sepsis.  Lethargic. EXAM: CHEST  2 VIEW COMPARISON:  None FINDINGS: Two views of the chest demonstrate low lung volumes. No focal airspace disease or frank pulmonary edema. Prior median sternotomy. Surgical hardware in the lumbar spine. Aortic atherosclerosis. Heart size is within normal limits. IMPRESSION: Low lung volumes.  No acute findings. Electronically Signed   By: Richarda Overlie M.D.   On: 12/17/2016 13:17   Dg Cholangiogram Operative  Result Date: 12/19/2016 CLINICAL DATA:  83 year old male with a history of cholelithiasis EXAM: INTRAOPERATIVE CHOLANGIOGRAM TECHNIQUE: Cholangiographic images from the C-arm fluoroscopic device were submitted for interpretation post-operatively. Please see the procedural report for the amount of contrast and the fluoroscopy time utilized. COMPARISON:  None. FINDINGS: Surgical instruments project over the upper abdomen. There is cannulation of the cystic duct/gallbladder neck, with antegrade infusion of contrast. Caliber of the extrahepatic ductal system unremarkable No filling defect identified. Contrast is not observed to cross the ampulla IMPRESSION: Intraoperative cholangiogram demonstrates extrahepatic biliary ducts of unremarkable caliber, with no large filling defect identified. Note that contrast was not observed to cross the ampulla. Please refer to the dictated operative report for full details of intraoperative findings and procedure. Electronically Signed   By: Gilmer Mor D.O.   On: 12/19/2016  13:52   Ct Head Wo Contrast  Result Date: 12/17/2016 CLINICAL DATA:  Sepsis EXAM: CT HEAD WITHOUT CONTRAST TECHNIQUE: Contiguous axial images were obtained from the base of the skull through the vertex without intravenous contrast. COMPARISON:  None. FINDINGS: Brain: Moderate atrophy. Negative for hydrocephalus. Mild chronic microvascular ischemic change in the white matter and basal ganglia. Negative for acute infarct.  Negative for hemorrhage or mass. Vascular: Negative for hyperdense vessel Skull: Negative Sinuses/Orbits: Mucous retention cyst right maxillary sinus. Air-fluid level in the sphenoid sinus. Other: None IMPRESSION: Atrophy and chronic microvascular ischemic change. No acute intracranial abnormality Air-fluid level sphenoid sinus. Electronically Signed   By: Marlan Palau M.D.   On: 12/17/2016 12:37   Ct Abdomen Pelvis W Contrast  Result Date:  12/17/2016 CLINICAL DATA:  Upper quadrant pain. EXAM: CT ABDOMEN AND PELVIS WITH CONTRAST TECHNIQUE: Multidetector CT imaging of the abdomen and pelvis was performed using the standard protocol following bolus administration of intravenous contrast. CONTRAST:  75mL ISOVUE-300 IOPAMIDOL (ISOVUE-300) INJECTION 61% COMPARISON:  None available FINDINGS: Lower chest: Trace bilateral effusions greater on the RIGHT. Hepatobiliary: Several low-density lesions in liver consistent benign hepatic cyst. No biliary duct dilatation. Gallbladder is distended to 4 cm. There is pericholecystic inflammation in the fat adjacent to the gallbladder. There is a calculus in the neck of the gallbladder measuring 10 mm (image 25, series 3). Small amount pericholecystic fluid. The distal common bile duct is upper limits normal at 6 mm. No common bile duct stone identified by CT. Pancreas: No pancreatic inflammation or duct dilatation. Spleen: Normal spleen Adrenals/urinary tract: Adrenal glands and kidneys are normal. The ureters and bladder normal. Stomach/Bowel: Stomach and  small bowel are normal. Appendix not identified. Surgical clips in the cecal region. Colon rectosigmoid colon normal. Vascular/Lymphatic: Abdominal aorta is normal caliber with atherosclerotic calcification. There is no retroperitoneal or periportal lymphadenopathy. No pelvic lymphadenopathy. Reproductive: Prostate normal Other: No free fluid. Musculoskeletal: No aggressive osseous lesion. IMPRESSION: 1. Findings consistent with acute cholecystitis. 2. Common bile duct upper limits of normal. 3.  Aortic Atherosclerosis (ICD10-I70.0). Electronically Signed   By: Genevive Bi M.D.   On: 12/17/2016 14:35      Subjective: Pleasantly confused. Doesn't say much. However denied pain. As per nursing, Ate well earlier in the day yesterday but not so much at supper. Ate small amounts at breakfast today. Having BMs. No other acute events reported by RN.  Discharge Exam:  Vitals:   12/24/16 1427 12/24/16 2205 12/25/16 0515 12/25/16 1245  BP: (!) 147/61 137/60 132/65 122/60  Pulse: 65 82 87 68  Resp: 18 18 18 18   Temp: 98.1 F (36.7 C) 97.8 F (36.6 C) 97.8 F (36.6 C) 97.7 F (36.5 C)  TempSrc: Oral Oral Oral Oral  SpO2: 97% 98% 97% 98%  Weight:      Height:        General exam: Pleasant elderly male lying comfortably propped up in bed. Respiratory system: Clear to auscultation. Respiratory effort normal. Cardiovascular system: S1 & S2 heard, RRR. No JVD, murmurs, rubs, gallops or clicks. No pedal edema. Telemetry: SB in the high 50s-SR in the 60s. Gastrointestinal system: Abdomen is nondistended, soft and nontender. No organomegaly or masses felt. Normal bowel sounds heard. Laparoscopy sites clean and dry without acute findings. Central nervous system: Alert and oriented only to self. No focal neurological deficits. Extremities: Symmetric 5 x 5 power. Skin: No rashes, lesions or ulcers Psychiatry: Judgement and insight appear normal. Mood & affect flat.   The results of significant  diagnostics from this hospitalization (including imaging, microbiology, ancillary and laboratory) are listed below for reference.     Microbiology: Recent Results (from the past 240 hour(s))  Urine culture     Status: None   Collection Time: 12/17/16 11:54 AM  Result Value Ref Range Status   Specimen Description URINE, RANDOM  Final   Special Requests NONE  Final   Culture   Final    NO GROWTH Performed at [facility], [address], [place], [place] [address]    Report Status 12/18/2016 FINAL  Final  Blood Culture (routine x 2)     Status: None   Collection Time: 12/17/16 12:00 PM  Result Value Ref Range Status   Specimen Description  BLOOD RIGHT WRIST  Final   Special Requests   Final    BOTTLES DRAWN AEROBIC AND ANAEROBIC Blood Culture adequate volume   Culture   Final    NO GROWTH 5 DAYS Performed at [facility], [address] [place], [place] [address]    Report Status 12/22/2016 FINAL  Final  Blood Culture (routine x 2)     Status: None   Collection Time: 12/17/16  1:20 PM  Result Value Ref Range Status   Specimen Description BLOOD LEFT ANTECUBITAL  Final   Special Requests   Final    BOTTLES DRAWN AEROBIC AND ANAEROBIC Blood Culture adequate volume   Culture   Final    NO GROWTH 5 DAYS Performed at [facility], [address] [place], [place] [address]    Report Status 12/22/2016 FINAL  Final  MRSA PCR Screening     Status: None   Collection Time: 12/17/16 10:36 PM  Result Value Ref Range Status   MRSA by PCR NEGATIVE NEGATIVE Final    Comment:        The GeneXpert MRSA Assay (FDA approved for NASAL specimens only), is one component of a comprehensive MRSA colonization surveillance program. It is not intended to diagnose MRSA infection nor to guide or monitor treatment for MRSA infections.      Labs: CBC:  Recent Labs Lab 12/20/16 0951 12/21/16 0359 12/22/16 0937 12/23/16 0451 12/24/16 0524  WBC 8.8 10.5 7.7 6.8 7.2   NEUTROABS 7.9* 9.2* 6.0 4.9 5.3  HGB 11.6* 11.7* 12.7* 11.9* 12.5*  HCT 35.3* 35.9* 37.9* 36.0* 36.6*  MCV 92.4 92.3 90.9 91.1 88.6  PLT 189 228 254 278 288   Basic Metabolic Panel:  Recent Labs Lab 12/20/16 0951 12/21/16 0702 12/22/16 0937 12/23/16 0451 12/24/16 0524  NA 144 145 145 141 141  K 4.4 3.7 2.9* 3.6 3.3*  CL 116* 114* 110 108 107  CO2 22 25 28 26 26   GLUCOSE 152* 128* 95 104* 97  BUN 29* 29* 22* 18 15  CREATININE 1.20 1.03 0.90 0.94 1.02  CALCIUM 7.8* 8.0* 8.0* 7.8* 8.1*  MG 2.6* 2.4 2.0 1.7 2.1  PHOS 2.9 2.4* 3.0 3.4 3.2   Liver Function Tests:  Recent Labs Lab 12/20/16 0951 12/21/16 0702 12/22/16 0937 12/23/16 0451 12/24/16 0524  AST 45* 36 31 19 24   ALT 33 34 32 24 24  ALKPHOS 60 58 59 57 58  BILITOT 0.9 0.7 0.8 0.6 0.7  PROT 5.6* 5.5* 5.6* 5.1* 5.7*  ALBUMIN 2.2* 2.3* 2.4* 2.2* 2.5*   Urinalysis    Component Value Date/Time   COLORURINE YELLOW 12/17/2016 1154   APPEARANCEUR CLEAR 12/17/2016 1154   LABSPEC 1.026 12/17/2016 1154   PHURINE 5.0 12/17/2016 1154   GLUCOSEU NEGATIVE 12/17/2016 1154   HGBUR NEGATIVE 12/17/2016 1154   BILIRUBINUR NEGATIVE 12/17/2016 1154   KETONESUR NEGATIVE 12/17/2016 1154   PROTEINUR 30 (A) 12/17/2016 1154   NITRITE NEGATIVE 12/17/2016 1154   LEUKOCYTESUR NEGATIVE 12/17/2016 1154    Discussed with patient's sister. Updated care and answered questions.  Time coordinating discharge: Over 30 minutes  SIGNED:  Marcellus ScottHONGALGI,Riann Oman, MD, FACP, FHM. [facility] Pager [phone]  If 7PM-7AM, please contact night-coverage [email or web address] Password [email or web address] 12/25/2016, 1:38 PM

## 2016-12-25 NOTE — Clinical Social Work Placement (Signed)
Pt's daughter/legal guardian has selected [facility] for [name]. Daughter completed admission paperwork at facility this morning and will be going room 509. Report # for RN [phone]. Updated pt's [name] ([facility]) of pt's disposition.  Pt will transport via [facility]- CSW completed medical necessity form and arranged transportation.  All information provided to facility via the [facility]  See below for placement details  CLINICAL SOCIAL WORK PLACEMENT  NOTE  Date:  12/25/2016  Patient Details  Name: Travis MoloneyJohn Gryder MRN: [id number] Date of Birth: 12/24/1930  Clinical Social Work is seeking post-discharge placement for this patient at the Skilled  Nursing Facility level of care (*CSW will initial, date and re-position this form in  chart as items are completed):  Yes   Patient/family provided with [facility] Clinical Social Work Department's list of facilities offering this level of care within the geographic area requested by the patient (or if unable, by the patient's family).  Yes   Patient/family informed of their freedom to choose among providers that offer the needed level of care, that participate in Medicare, Medicaid or managed care program needed by the patient, have an available bed and are willing to accept the patient.  Yes   Patient/family informed of [facility]'s ownership interest in [facility] and [facility], as well as of the fact that they are under no obligation to receive care at these facilities.  PASRR submitted to EDS on       PASRR number received on       Existing PASRR number confirmed on 12/24/16     FL2 transmitted to all facilities in geographic area requested by pt/family on 12/24/16     FL2 transmitted to all facilities within larger geographic area on       Patient informed that his/her managed care company has contracts with or will negotiate with certain facilities, including the following:        Yes   Patient/family informed of  bed offers received.  Patient chooses bed at [facility]     Physician recommends and patient chooses bed at [facility]    Patient to be transferred to [facility] on 12/25/16.  Patient to be transferred to facility by [facility]     Patient family notified on 12/25/16 of transfer.  Name of family member notified:  Daughter [facility]     PHYSICIAN       Additional Comment:    _______________________________________________ Nelwyn SalisburyMeghan R Halli Equihua, LCSW 12/25/2016, 1:54 PM  [phone]

## 2016-12-25 NOTE — [place] FL2 (Signed)
[place] MEDICAID FL2 LEVEL OF CARE SCREENING TOOL     IDENTIFICATION  Patient Name: Travis Wallace Birthdate: 1931/06/30 Sex: male Admission Date (Current Location): 12/17/2016  [facility] and [place] Number:  Producer, television/film/video and Address:  [facility],  [address] [place] [address]      Provider Number: [id number]  Attending Physician Name and Address:  Elease Etienne, MD  Relative Name and Phone Number:       Current Level of Care: Hospital Recommended Level of Care: Skilled Nursing Facility Prior Approval Number:    Date Approved/Denied:   PASRR Number: [id number]  Discharge Plan: Home    Current Diagnoses: Patient Active Problem List   Diagnosis Date Noted  . Pericholecystic abscess s/p OR drainage 12/19/2016 12/21/2016  . Delirium 12/21/2016  . Alzheimer's dementia   . Hypertension   . GERD (gastroesophageal reflux disease)   . Sleep apnea   . Restless leg   . Insomnia   . Sinus bradycardia 12/19/2016  . Hyperbilirubinemia 12/19/2016  . AKI (acute kidney injury) (HCC) 12/19/2016  . Altered mental status   . Acute gangrenous cholecystitis s/p lap cholecystectomy 12/19/2016 12/17/2016  . Cholangitis 12/17/2016  . Severe sepsis (HCC) 12/17/2016  . CAD (coronary artery disease) 12/17/2016  . HTN (hypertension) 12/17/2016  . OSA (obstructive sleep apnea) 12/17/2016  . Sepsis (HCC) 12/17/2016  . Dementia 12/17/2016    Orientation RESPIRATION BLADDER Height & Weight     Self, Place  Normal Incontinent Weight: 177 lb 0.5 oz (80.3 kg) Height:  5\' 4"  (162.6 cm)  BEHAVIORAL SYMPTOMS/MOOD NEUROLOGICAL BOWEL NUTRITION STATUS      Continent Diet (soft diet, thin fluid constistency)  AMBULATORY STATUS COMMUNICATION OF NEEDS Skin   Extensive Assist Verbally Surgical wounds                       Personal Care Assistance Level of Assistance  Bathing, Feeding, Dressing Bathing Assistance:  (supervision) Feeding assistance:  Independent Dressing Assistance: Independent     Functional Limitations Info  Sight, Hearing, Speech Sight Info: Adequate Hearing Info: Adequate Speech Info: Adequate    SPECIAL CARE FACTORS FREQUENCY  PT (By licensed PT), OT (By licensed OT)     PT Frequency: 5x OT Frequency: 5x            Contractures Contractures Info: Not present    Additional Factors Info  Code Status, Allergies Code Status Info: full Allergies Info: memantine           Current Medications (12/25/2016):  This is the current hospital active medication list Current Facility-Administered Medications  Medication Dose Route Frequency Provider Last Rate Last Dose  . 0.9 %  sodium chloride infusion  250 mL Intravenous PRN Karie Soda, MD      . acetaminophen (TYLENOL) tablet 1,000 mg  1,000 mg Oral TID Karie Soda, MD   1,000 mg at 12/25/16 1111  . alum & mag hydroxide-simeth (MAALOX/MYLANTA) 200-200-20 MG/5ML suspension 30 mL  30 mL Oral Q6H PRN Karie Soda, MD      . amLODipine (NORVASC) tablet 5 mg  5 mg Oral Daily Marguerita Merles [place], DO   5 mg at 12/25/16 1111   And  . benazepril (LOTENSIN) tablet 20 mg  20 mg Oral Daily Marguerita Merles [place], DO   20 mg at 12/25/16 1110  . amoxicillin-clavulanate (AUGMENTIN) 875-125 MG per tablet 1 tablet  1 tablet Oral Q12H Rayburn, Kelly A, PA-C   1 tablet at  12/25/16 1109  . aspirin EC tablet 81 mg  81 mg Oral Daily Karie Soda, MD   81 mg at 12/25/16 1112  . atorvastatin (LIPITOR) tablet 20 mg  20 mg Oral Laurena Slimmer, MD   20 mg at 12/24/16 2205  . bisacodyl (DULCOLAX) suppository 10 mg  10 mg Rectal Q12H PRN Karie Soda, MD      . chlorhexidine (PERIDEX) 0.12 % solution 15 mL  15 mL Mouth Rinse BID Doutova, Anastassia, MD   15 mL at 12/25/16 1103  . donepezil (ARICEPT) tablet 10 mg  10 mg Oral Laurena Slimmer, MD   10 mg at 12/24/16 2204  . doxazosin (CARDURA) tablet 4 mg  4 mg Oral Daily Karie Soda, MD   4 mg at 12/25/16 1112  . enoxaparin  (LOVENOX) injection 40 mg  40 mg Subcutaneous Q24H Rayburn, Kelly A, PA-C   40 mg at 12/25/16 1110  . feeding supplement (ENSURE SURGERY) liquid 237 mL  237 mL Oral BID BM Karie Soda, MD   237 mL at 12/24/16 1400  . guaiFENesin-dextromethorphan (ROBITUSSIN DM) 100-10 MG/5ML syrup 10 mL  10 mL Oral Q4H PRN Karie Soda, MD      . hydrocortisone (ANUSOL-HC) 2.5 % rectal cream 1 application  1 application Topical QID PRN Karie Soda, MD      . hydrocortisone cream 1 % 1 application  1 application Topical TID PRN Karie Soda, MD      . HYDROmorphone (DILAUDID) injection 0.5 mg  0.5 mg Intravenous Q4H PRN Doutova, Anastassia, MD      . lactulose (CHRONULAC) 10 GM/15ML solution 20 g  20 g Oral Daily Karie Soda, MD   20 g at 12/25/16 1107  . lip balm (CARMEX) ointment 1 application  1 application Topical BID Karie Soda, MD   1 application at 12/24/16 2200  . loratadine (CLARITIN) tablet 10 mg  10 mg Oral Daily Karie Soda, MD   10 mg at 12/25/16 1111  . LORazepam (ATIVAN) injection 0.5 mg  0.5 mg Intravenous Q6H PRN Therisa Doyne, MD   0.5 mg at 12/22/16 0015  . magic mouthwash  15 mL Oral QID PRN Karie Soda, MD      . magnesium gluconate (MAGONATE) tablet 500 mg  500 mg Oral Daily Karie Soda, MD   500 mg at 12/25/16 1107  . magnesium hydroxide (MILK OF MAGNESIA) suspension 30 mL  30 mL Oral Q12H PRN Karie Soda, MD      . MEDLINE mouth rinse  15 mL Mouth Rinse q12n4p Doutova, Anastassia, MD   15 mL at 12/24/16 1600  . menthol-cetylpyridinium (CEPACOL) lozenge 3 mg  1 lozenge Oral PRN Karie Soda, MD      . methocarbamol (ROBAXIN) 1,000 mg in dextrose 5 % 50 mL IVPB  1,000 mg Intravenous Q6H PRN Karie Soda, MD      . niacin tablet 500 mg  500 mg Oral Daily Karie Soda, MD   500 mg at 12/25/16 1107  . ondansetron (ZOFRAN) tablet 4 mg  4 mg Oral Q6H PRN Doutova, Anastassia, MD       Or  . ondansetron (ZOFRAN) injection 4 mg  4 mg Intravenous Q6H PRN Doutova, Anastassia,  MD      . pantoprazole (PROTONIX) EC tablet 40 mg  40 mg Oral Daily Karie Soda, MD   40 mg at 12/25/16 1111  . phenol (CHLORASEPTIC) mouth spray 1-2 spray  1-2 spray Mouth/Throat PRN Karie Soda, MD      .  prochlorperazine (COMPAZINE) injection 5-10 mg  5-10 mg Intravenous Q4H PRN Karie SodaGross, Steven, MD      . psyllium (HYDROCIL/METAMUCIL) packet 1 packet  1 packet Oral Daily Karie SodaGross, Steven, MD   1 packet at 12/25/16 1109  . QUEtiapine (SEROQUEL) tablet 25 mg  25 mg Oral Daily PRN Sheikh, Omair Latif, DO      . QUEtiapine (SEROQUEL) tablet 75 mg  75 mg Oral Laurena SlimmerQHS Gross, Steven, MD   75 mg at 12/24/16 2204  . rOPINIRole (REQUIP) tablet 1 mg  1 mg Oral Laurena SlimmerQHS Gross, Steven, MD   1 mg at 12/24/16 2200  . saccharomyces boulardii (FLORASTOR) capsule 250 mg  250 mg Oral BID Karie SodaGross, Steven, MD   250 mg at 12/25/16 1111  . sodium chloride flush (NS) 0.9 % injection 3 mL  3 mL Intravenous Q12H Doutova, Anastassia, MD   3 mL at 12/24/16 2200  . sodium chloride flush (NS) 0.9 % injection 3 mL  3 mL Intravenous Catha GosselinQ12H Gross, Steven, MD   3 mL at 12/24/16 2200  . sodium chloride flush (NS) 0.9 % injection 3 mL  3 mL Intravenous PRN Karie SodaGross, Steven, MD      . traMADol Janean Sark(ULTRAM) tablet 50-100 mg  50-100 mg Oral Q6H PRN Karie SodaGross, Steven, MD         Discharge Medications: Please see discharge summary for a list of discharge medications.  Relevant Imaging Results:  Relevant Lab Results:   Additional Information [id number]  Nelwyn SalisburyMeghan R Deberah Adolf, LCSW

## 2016-12-26 ENCOUNTER — Non-Acute Institutional Stay (SKILLED_NURSING_FACILITY): Payer: Medicare Other | Admitting: Internal Medicine

## 2016-12-26 ENCOUNTER — Encounter: Payer: Self-pay | Admitting: Internal Medicine

## 2016-12-26 DIAGNOSIS — I10 Essential (primary) hypertension: Secondary | ICD-10-CM

## 2016-12-26 DIAGNOSIS — I25810 Atherosclerosis of coronary artery bypass graft(s) without angina pectoris: Secondary | ICD-10-CM

## 2016-12-26 DIAGNOSIS — R001 Bradycardia, unspecified: Secondary | ICD-10-CM | POA: Diagnosis not present

## 2016-12-26 DIAGNOSIS — A419 Sepsis, unspecified organism: Secondary | ICD-10-CM | POA: Diagnosis not present

## 2016-12-26 DIAGNOSIS — N179 Acute kidney failure, unspecified: Secondary | ICD-10-CM | POA: Diagnosis not present

## 2016-12-26 DIAGNOSIS — E872 Acidosis, unspecified: Secondary | ICD-10-CM

## 2016-12-26 DIAGNOSIS — K8309 Other cholangitis: Secondary | ICD-10-CM

## 2016-12-26 DIAGNOSIS — G301 Alzheimer's disease with late onset: Secondary | ICD-10-CM

## 2016-12-26 DIAGNOSIS — G4733 Obstructive sleep apnea (adult) (pediatric): Secondary | ICD-10-CM | POA: Diagnosis not present

## 2016-12-26 DIAGNOSIS — K819 Cholecystitis, unspecified: Secondary | ICD-10-CM | POA: Diagnosis not present

## 2016-12-26 DIAGNOSIS — K219 Gastro-esophageal reflux disease without esophagitis: Secondary | ICD-10-CM | POA: Diagnosis not present

## 2016-12-26 DIAGNOSIS — K810 Acute cholecystitis: Secondary | ICD-10-CM

## 2016-12-26 DIAGNOSIS — E785 Hyperlipidemia, unspecified: Secondary | ICD-10-CM

## 2016-12-26 DIAGNOSIS — K830 Cholangitis: Secondary | ICD-10-CM | POA: Diagnosis not present

## 2016-12-26 DIAGNOSIS — R6520 Severe sepsis without septic shock: Secondary | ICD-10-CM

## 2016-12-26 DIAGNOSIS — F02818 Dementia in other diseases classified elsewhere, unspecified severity, with other behavioral disturbance: Secondary | ICD-10-CM

## 2016-12-26 DIAGNOSIS — F0281 Dementia in other diseases classified elsewhere with behavioral disturbance: Secondary | ICD-10-CM

## 2016-12-26 NOTE — Progress Notes (Signed)
: Provider:  Randon Goldsmith. Travis Hollingshead, MD Location:  [facility] Room Number: 509 Place of Service:  [name] (31)  PCP: Travis Salvage, PA-C Patient Care Team: Travis Wallace as PCP - General (Internal Medicine)  Extended Emergency Contact Information Primary Emergency Contact: [name] Address: [address]          [place], [place] [address] [name] Home Phone: [phone] Relation: Sister     Allergies: Memantine  Chief Complaint  Patient presents with  . New Admit To [name]    following hospitalization 12/17/16 to 12/25/16 acute gangrenous cholecystitis    HPI: Patient is 83 y.o. male with Alzheimer's dementia, CAD, HLD, HTN, GERD, depression, presented with 1-2 days history of worsening confusion, lethargy and fevers. He had been evaluated at [facility] 5 days prior to admission for chest pain and found to have gallbladder sludge but no acute cholecystitis. Admitted to [facility] from 6/20-28 for sepsis secondary to acute cholecystitis.Pt underwent a lap Cholecystectomy on 622/2018. Hospital course was complicated by asymptomatic sinus bradycardia not requiring a pacemaker and by acute kidney injury likely related to sepsis, now resolved. Patient is to be admitted to skilled nursing facility for generalized weakness for OT/PT. While at skilled nursing facility patient will be followed for hypertension treated with Norvasc Benzapril Adoxa Zosyn, coronary artery disease treated with aspirin and Lipitor and hyperlipidemia treated with statins.  Past Medical History:  Diagnosis Date  . Acute gangrenous cholecystitis 12/19/2016   s/p lap cholecystectomy 12/19/16  . Acute kidney injury (HCC)   . Alzheimer's dementia   . Alzheimer's dementia   . Coronary artery disease   . Depression   . Dyslipidemia   . GERD (gastroesophageal reflux disease)   . Hyperbilirubinemia   . Hypertension   . Insomnia   . Pericholecystic abscess 12/17/2016   s/p OR drainage 12/19/16  . Restless leg   . Severe sepsis (HCC) 12/17/2016  . Sinus bradycardia   . Sleep apnea     Past Surgical History:  Procedure Laterality Date  . BACK SURGERY     x2  . CARDIAC CATHETERIZATION    . CATARACT EXTRACTION, BILATERAL    . CHOLECYSTECTOMY N/A 12/19/2016   Procedure: LAPAROSCOPIC CHOLECYSTECTOMY WITH INTRAOPERATIVE CHOLANGIOGRAM;  Surgeon: Ovidio Kin, MD;  Location: [facility] ORS;  Service: General;  Laterality: N/A;  . CORONARY ARTERY BYPASS GRAFT     X 3 VESSELS  . EYE SURGERY      Allergies as of 12/26/2016      Reactions   Memantine Other (See Comments)   BRADYCARDIA      Medication List       Accurate as of 12/26/16 10:34 AM. Always use your most recent med list.          acetaminophen 325 MG tablet Commonly known as:  TYLENOL Take 2 tablets (650 mg total) by mouth every 6 (six) hours as needed for mild pain, moderate pain or fever.   amLODipine-benazepril 5-20 MG capsule Commonly known as:  LOTREL Take 1 capsule by mouth daily.   amoxicillin-clavulanate 875-125 MG tablet Commonly known as:  AUGMENTIN Take 1 tablet by mouth every 12 (twelve) hours. Discontinue after 01/03/2017 doses.   aspirin EC 81 MG tablet Take 81 mg by mouth daily.   atorvastatin 20 MG tablet Commonly known as:  LIPITOR Take 20 mg by mouth at bedtime.   cetirizine 10 MG tablet Commonly known as:  ZYRTEC Take 10 mg by mouth  daily.   doxazosin 4 MG tablet Commonly known as:  CARDURA Take 4 mg by mouth daily.   lactulose 10 GM/15ML solution Commonly known as:  CHRONULAC Take 30 mLs by mouth daily.   lansoprazole 30 MG capsule Commonly known as:  PREVACID Take 30 mg by mouth daily.   magnesium gluconate 500 MG tablet Commonly known as:  MAGONATE Take 500 mg by mouth daily.   niacin 500 MG tablet Take 500 mg by mouth daily.   psyllium 95 % Pack Commonly known as:  HYDROCIL/METAMUCIL Take 1 packet by mouth daily.   QUEtiapine 25 MG  tablet Commonly known as:  SEROQUEL Take 25 mg by mouth daily as needed (restlessness).   QUEtiapine 25 MG tablet Commonly known as:  SEROQUEL Take 75 mg by mouth at bedtime.   REQUIP 1 MG tablet Generic drug:  rOPINIRole Take 1 mg by mouth at bedtime.       No orders of the defined types were placed in this encounter.    There is no immunization history on file for this patient.  Social History  Substance Use Topics  . Smoking status: Never Smoker  . Smokeless tobacco: Never Used  . Alcohol use No    Family history is   Family History  Problem Relation Age of Onset  . Pancreatic cancer Mother   . Hypertension Father   . CAD Father   . Stroke Father   . CAD Sister   . Diabetes Neg Hx       Review of Systems  DATA OBTAINED: from patient, nurse GENERAL:  no fevers, fatigue, appetite changes SKIN: No itching, or rash EYES: No eye pain, redness, discharge EARS: No earache, tinnitus, change in hearing NOSE: No congestion, drainage or bleeding  MOUTH/THROAT: No mouth or tooth pain, No sore throat RESPIRATORY: No cough, wheezing, SOB CARDIAC: No chest pain, palpitations, lower extremity edema  GI: No abdominal pain, No N/V/D or constipation, No heartburn or reflux  GU: No dysuria, frequency or urgency, or incontinence  MUSCULOSKELETAL: No unrelieved bone/joint pain NEUROLOGIC: No headache, dizziness or focal weakness PSYCHIATRIC: No c/o anxiety or sadness   Vitals:   12/26/16 1016  BP: 133/79  Pulse: 80  Resp: 20  Temp: (!) 96.8 F (36 C)    SpO2 Readings from Last 1 Encounters:  12/26/16 90%   Body mass index is 30.38 kg/m.     Physical Exam  GENERAL APPEARANCE: Alert, conversant,  No acute distress.  SKIN: No diaphoresis rash HEAD: Normocephalic, atraumatic  EYES: Conjunctiva/lids clear. Pupils round, reactive. EOMs intact.  EARS: External exam WNL, canals clear. Hearing grossly normal.  NOSE: No deformity or discharge.  MOUTH/THROAT: Lips  w/o lesions  RESPIRATORY: Breathing is even, unlabored. Lung sounds are clear   CARDIOVASCULAR: Heart RRR no murmurs, rubs or gallops. No peripheral edema.   GASTROINTESTINAL: Abdomen is soft, non-tender, + distended w/ normal bowel sounds. $ small incisional wounds healing GENITOURINARY: Bladder non tender, not distended  MUSCULOSKELETAL: No abnormal joints or musculature NEUROLOGIC:  Cranial nerves 2-12 grossly intact. Moves all extremities  PSYCHIATRIC: Mood and affect appropriate to situation, no behavioral issues  Patient Active Problem List   Diagnosis Date Noted  . Pericholecystic abscess s/p OR drainage 12/19/2016 12/21/2016  . Delirium 12/21/2016  . Alzheimer's dementia   . Hypertension   . GERD (gastroesophageal reflux disease)   . Sleep apnea   . Restless leg   . Insomnia   . Sinus bradycardia 12/19/2016  . Hyperbilirubinemia 12/19/2016  .  AKI (acute kidney injury) (HCC) 12/19/2016  . Altered mental status   . Acute gangrenous cholecystitis s/p lap cholecystectomy 12/19/2016 12/17/2016  . Cholangitis 12/17/2016  . Severe sepsis (HCC) 12/17/2016  . CAD (coronary artery disease) 12/17/2016  . HTN (hypertension) 12/17/2016  . OSA (obstructive sleep apnea) 12/17/2016  . Sepsis (HCC) 12/17/2016  . Dementia 12/17/2016      Labs reviewed: Basic Metabolic Panel:    Component Value Date/Time   NA 141 12/24/2016 0524   K 3.3 (L) 12/24/2016 0524   CL 107 12/24/2016 0524   CO2 26 12/24/2016 0524   GLUCOSE 97 12/24/2016 0524   BUN 15 12/24/2016 0524   CREATININE 1.02 12/24/2016 0524   CALCIUM 8.1 (L) 12/24/2016 0524   PROT 5.7 (L) 12/24/2016 0524   ALBUMIN 2.5 (L) 12/24/2016 0524   AST 24 12/24/2016 0524   ALT 24 12/24/2016 0524   ALKPHOS 58 12/24/2016 0524   BILITOT 0.7 12/24/2016 0524   GFRNONAA >60 12/24/2016 0524   GFRAA >60 12/24/2016 0524     Recent Labs  12/22/16 0937 12/23/16 0451 12/24/16 0524  NA 145 141 141  K 2.9* 3.6 3.3*  CL 110 108 107  CO2  28 26 26   GLUCOSE 95 104* 97  BUN 22* 18 15  CREATININE 0.90 0.94 1.02  CALCIUM 8.0* 7.8* 8.1*  MG 2.0 1.7 2.1  PHOS 3.0 3.4 3.2   Liver Function Tests:  Recent Labs  12/22/16 0937 12/23/16 0451 12/24/16 0524  AST 31 19 24   ALT 32 24 24  ALKPHOS 59 57 58  BILITOT 0.8 0.6 0.7  PROT 5.6* 5.1* 5.7*  ALBUMIN 2.4* 2.2* 2.5*    Recent Labs  12/17/16 1154 12/18/16 0327  LIPASE 32 32   No results for input(s): AMMONIA in the last 8760 hours. CBC:  Recent Labs  12/22/16 0937 12/23/16 0451 12/24/16 0524  WBC 7.7 6.8 7.2  NEUTROABS 6.0 4.9 5.3  HGB 12.7* 11.9* 12.5*  HCT 37.9* 36.0* 36.6*  MCV 90.9 91.1 88.6  PLT 254 278 288   Lipid No results for input(s): CHOL, HDL, LDLCALC, TRIG in the last 8760 hours.  Cardiac Enzymes: No results for input(s): CKTOTAL, CKMB, CKMBINDEX, TROPONINI in the last 8760 hours. BNP: No results for input(s): BNP in the last 8760 hours. No results found for: MICROALBUR No results found for: HGBA1C Lab Results  Component Value Date   TSH 1.394 12/18/2016   No results found for: VITAMINB12 No results found for: FOLATE No results found for: IRON, TIBC, FERRITIN  Imaging and Procedures obtained prior to [name] admission: Dg Chest 2 View  Result Date: 12/17/2016 CLINICAL DATA:  Sepsis.  Lethargic. EXAM: CHEST  2 VIEW COMPARISON:  None FINDINGS: Two views of the chest demonstrate low lung volumes. No focal airspace disease or frank pulmonary edema. Prior median sternotomy. Surgical hardware in the lumbar spine. Aortic atherosclerosis. Heart size is within normal limits. IMPRESSION: Low lung volumes.  No acute findings. Electronically Signed   By: Richarda Overlie M.D.   On: 12/17/2016 13:17   Ct Head Wo Contrast  Result Date: 12/17/2016 CLINICAL DATA:  Sepsis EXAM: CT HEAD WITHOUT CONTRAST TECHNIQUE: Contiguous axial images were obtained from the base of the skull through the vertex without intravenous contrast. COMPARISON:  None. FINDINGS: Brain:  Moderate atrophy. Negative for hydrocephalus. Mild chronic microvascular ischemic change in the white matter and basal ganglia. Negative for acute infarct.  Negative for hemorrhage or mass. Vascular: Negative for hyperdense vessel Skull: Negative  Sinuses/Orbits: Mucous retention cyst right maxillary sinus. Air-fluid level in the sphenoid sinus. Other: None IMPRESSION: Atrophy and chronic microvascular ischemic change. No acute intracranial abnormality Air-fluid level sphenoid sinus. Electronically Signed   By: Marlan Palau M.D.   On: 12/17/2016 12:37   Ct Abdomen Pelvis W Contrast  Result Date: 12/17/2016 CLINICAL DATA:  Upper quadrant pain. EXAM: CT ABDOMEN AND PELVIS WITH CONTRAST TECHNIQUE: Multidetector CT imaging of the abdomen and pelvis was performed using the standard protocol following bolus administration of intravenous contrast. CONTRAST:  75mL ISOVUE-300 IOPAMIDOL (ISOVUE-300) INJECTION 61% COMPARISON:  None available FINDINGS: Lower chest: Trace bilateral effusions greater on the RIGHT. Hepatobiliary: Several low-density lesions in liver consistent benign hepatic cyst. No biliary duct dilatation. Gallbladder is distended to 4 cm. There is pericholecystic inflammation in the fat adjacent to the gallbladder. There is a calculus in the neck of the gallbladder measuring 10 mm (image 25, series 3). Small amount pericholecystic fluid. The distal common bile duct is upper limits normal at 6 mm. No common bile duct stone identified by CT. Pancreas: No pancreatic inflammation or duct dilatation. Spleen: Normal spleen Adrenals/urinary tract: Adrenal glands and kidneys are normal. The ureters and bladder normal. Stomach/Bowel: Stomach and small bowel are normal. Appendix not identified. Surgical clips in the cecal region. Colon rectosigmoid colon normal. Vascular/Lymphatic: Abdominal aorta is normal caliber with atherosclerotic calcification. There is no retroperitoneal or periportal lymphadenopathy. No  pelvic lymphadenopathy. Reproductive: Prostate normal Other: No free fluid. Musculoskeletal: No aggressive osseous lesion. IMPRESSION: 1. Findings consistent with acute cholecystitis. 2. Common bile duct upper limits of normal. 3.  Aortic Atherosclerosis (ICD10-I70.0). Electronically Signed   By: Genevive Bi M.D.   On: 12/17/2016 14:35     Not all labs, radiology exams or other studies done during hospitalization come through on my EPIC note; however they are reviewed by me.    Assessment and Plan  #1 acute gangrenous cholecystitis with focal abscess, status post laparoscopic cholecystectomy 12/19/2016-surgery was completed without problems, patient has completed 5 days of IV antibiotics as of 627 and is to be on 10 days of oral Augmentin through 01/03/17; pt did not require transfusion [name] - patient is admitted to skilled nursing facility with generalized weakness for OT PT. Patient will continue Augmentin 875 one by mouth every 12 hours through 7/7/ 2018; will f/u CBC  #2-#4 severe sepsis/lactic acidosis/acute kidney injury-urine culture and blood cultures are negative, patient treated with IV antibiotics and then Augmentin with resolution of his sepsis physiology lactic acidosis and acute kidney injury. D/C creatinine is 1.02; [name] - will f/u BMP  #5 asymptomatic sinus bradycardia-2-D echo showed ejection fraction of 55-60% and grade 2 diastolic dysfunction; there are no indications for pacemaker at this time avoiding Namenda is recommended on discharge heart rate was in the high 50s to low 60s. TSH was normal at 1.394. [name] - will monitor VS q shift and prn  #6 CAD status post CABG-stable without chest pain; cardiology had evaluated patient prior to operative procedure [name] - plan to continue aspirin 81 mg daily Lipitor 20 mg daily and blood pressure control with the benazepril and amlodipine  #7 hypertension [name] - controlled continue Lotrel 5-20 one by mouth daily, doxazosin 4 mg  daily  #8 hyperlipidemia [name] -not stated as uncontrolled continue Lipitor 20 mg daily at bedtime  #9 dementia with behaviors [name] - patient was on Aricept which was DC'd due to bradycardia patient should not be put on Namenda; patient is only on Seroquel  75 mg daily at bedtime for behaviors  #10 GERD [name] - stable, continue Prevacid 30 mg by mouth daily  Time spent . 45 min;> 50% of time with patient was spent reviewing records, labs, tests and studies, counseling and developing plan of care  Thurston Hole D. Travis Hollingshead, MD

## 2016-12-27 ENCOUNTER — Encounter: Payer: Self-pay | Admitting: Internal Medicine

## 2016-12-27 DIAGNOSIS — E785 Hyperlipidemia, unspecified: Secondary | ICD-10-CM | POA: Insufficient documentation

## 2016-12-27 DIAGNOSIS — E872 Acidosis, unspecified: Secondary | ICD-10-CM | POA: Insufficient documentation

## 2017-01-13 ENCOUNTER — Encounter: Payer: Self-pay | Admitting: Internal Medicine

## 2017-01-13 ENCOUNTER — Non-Acute Institutional Stay (SKILLED_NURSING_FACILITY): Payer: Medicare Other | Admitting: Internal Medicine

## 2017-01-13 DIAGNOSIS — K819 Cholecystitis, unspecified: Secondary | ICD-10-CM

## 2017-01-13 DIAGNOSIS — N179 Acute kidney failure, unspecified: Secondary | ICD-10-CM

## 2017-01-13 DIAGNOSIS — A419 Sepsis, unspecified organism: Secondary | ICD-10-CM

## 2017-01-13 DIAGNOSIS — R6520 Severe sepsis without septic shock: Secondary | ICD-10-CM

## 2017-01-13 DIAGNOSIS — E872 Acidosis, unspecified: Secondary | ICD-10-CM

## 2017-01-13 DIAGNOSIS — R001 Bradycardia, unspecified: Secondary | ICD-10-CM

## 2017-01-13 DIAGNOSIS — K810 Acute cholecystitis: Secondary | ICD-10-CM | POA: Diagnosis not present

## 2017-01-13 DIAGNOSIS — F0280 Dementia in other diseases classified elsewhere without behavioral disturbance: Secondary | ICD-10-CM

## 2017-01-13 DIAGNOSIS — E785 Hyperlipidemia, unspecified: Secondary | ICD-10-CM | POA: Diagnosis not present

## 2017-01-13 DIAGNOSIS — K830 Cholangitis: Secondary | ICD-10-CM | POA: Diagnosis not present

## 2017-01-13 DIAGNOSIS — K8309 Other cholangitis: Secondary | ICD-10-CM

## 2017-01-13 DIAGNOSIS — K651 Peritoneal abscess: Secondary | ICD-10-CM

## 2017-01-13 DIAGNOSIS — K219 Gastro-esophageal reflux disease without esophagitis: Secondary | ICD-10-CM

## 2017-01-13 DIAGNOSIS — I10 Essential (primary) hypertension: Secondary | ICD-10-CM

## 2017-01-13 DIAGNOSIS — I25810 Atherosclerosis of coronary artery bypass graft(s) without angina pectoris: Secondary | ICD-10-CM | POA: Diagnosis not present

## 2017-01-13 NOTE — Progress Notes (Signed)
Location:   Technical sales engineer of Service:   [name]    PCP: Kermit Balo, DO Patient Care Team: Kermit Balo, DO as PCP - General (Geriatric Medicine)  Extended Emergency Contact Information Primary Emergency Contact: [name] Address: [address]          [place], [place] [address] [name] of [place] Home Phone: [phone] Relation: Sister Secondary Emergency Contact: Edsel Petrin [place] of [place] Mobile Phone: [phone] Relation: Son  Allergies  Allergen Reactions  . Memantine Other (See Comments)    BRADYCARDIA    Chief Complaint  Patient presents with  . Discharge Note    discharge from [name]    HPI:  83 y.o. male  with Alzheimer's dementia, CAD, HLD, HTN, GERD, depression, presented with 1-2 days history of worsening confusion, lethargy and fevers. He had been evaluated at [facility] 5 days prior to admission for chest pain and found to have gallbladder sludge but no acute cholecystitis. Admitted to [facility] from 6/20-28 for sepsis secondary to acute cholecystitis.Pt underwent a lap Cholecystectomy on 622/2018. Hospital course was complicated by asymptomatic sinus bradycardia not requiring a pacemaker and by acute kidney injury likely related to sepsis, now resolved. Patient is to be admitted to skilled nursing facility for generalized weakness for OT/PT. PtIs now ready to be discharged to home.    Past Medical History:  Diagnosis Date  . Acute gangrenous cholecystitis 12/19/2016   s/p lap cholecystectomy 12/19/16  . Acute kidney injury (HCC)   . Alzheimer's dementia   . Alzheimer's dementia   . CAD (coronary artery disease) 12/17/2016  . Coronary artery disease   . Dementia 12/17/2016  . Depression   . Dyslipidemia   . GERD (gastroesophageal reflux disease)   . Hyperbilirubinemia   . Hypertension   . Insomnia   . OSA (obstructive sleep apnea) 12/17/2016  . Pericholecystic abscess  12/17/2016   s/p OR drainage 12/19/16  . Restless leg   . Severe sepsis (HCC) 12/17/2016  . Sinus bradycardia   . Sleep apnea     Past Surgical History:  Procedure Laterality Date  . BACK SURGERY     x2  . CARDIAC CATHETERIZATION    . CATARACT EXTRACTION, BILATERAL    . CHOLECYSTECTOMY N/A 12/19/2016   Procedure: LAPAROSCOPIC CHOLECYSTECTOMY WITH INTRAOPERATIVE CHOLANGIOGRAM;  Surgeon: Ovidio Kin, MD;  Location: [facility] ORS;  Service: General;  Laterality: N/A;  . CORONARY ARTERY BYPASS GRAFT     X 3 VESSELS  . EYE SURGERY       reports that he has never smoked. He has never used smokeless tobacco. He reports that he does not drink alcohol or use drugs. Social History   Social History  . Marital status: Married    Spouse name: N/A  . Number of children: N/A  . Years of education: N/A   Occupational History  . retired Academic librarian    Social History Main Topics  . Smoking status: Never Smoker  . Smokeless tobacco: Never Used  . Alcohol use No  . Drug use: No  . Sexual activity: No   Other Topics Concern  . Not on file   Social History Narrative   Admitted to [facility]   Married   Never smoked   Alcohol none   Full Code    Pertinent  Health Maintenance Due  Topic Date Due  . PNA  vac Low Risk Adult (1 of 2 - PCV13) 03/07/1998  . INFLUENZA VACCINE  01/28/2017    Medications: Allergies as of 01/13/2017      Reactions   Memantine Other (See Comments)   BRADYCARDIA      Medication List       Accurate as of 01/13/17  1:42 PM. Always use your most recent med list.          acetaminophen 325 MG tablet Commonly known as:  TYLENOL Take 2 tablets (650 mg total) by mouth every 6 (six) hours as needed for mild pain, moderate pain or fever.   amLODipine-benazepril 5-20 MG capsule Commonly known as:  LOTREL Take 1 capsule by mouth daily.   aspirin EC 81 MG tablet Take 81 mg by mouth daily.   atorvastatin 20 MG tablet Commonly known as:   LIPITOR Take 20 mg by mouth at bedtime.   cetirizine 10 MG tablet Commonly known as:  ZYRTEC Take 10 mg by mouth daily.   doxazosin 4 MG tablet Commonly known as:  CARDURA Take 4 mg by mouth daily.   lactulose 10 GM/15ML solution Commonly known as:  CHRONULAC Take 30 mLs by mouth daily.   lansoprazole 30 MG capsule Commonly known as:  PREVACID Take 30 mg by mouth daily.   magnesium gluconate 500 MG tablet Commonly known as:  MAGONATE Take 500 mg by mouth daily.   niacin 500 MG tablet Take 500 mg by mouth daily.   psyllium 95 % Pack Commonly known as:  HYDROCIL/METAMUCIL Take 1 packet by mouth daily.   QUEtiapine 25 MG tablet Commonly known as:  SEROQUEL Take 25 mg by mouth daily as needed (restlessness).   QUEtiapine 25 MG tablet Commonly known as:  SEROQUEL Take 75 mg by mouth at bedtime.   REQUIP 1 MG tablet Generic drug:  rOPINIRole Take 1 mg by mouth at bedtime.        Vitals:   01/13/17 1310  BP: 126/87  Pulse: 65  Resp: 13  Temp: 98.3 F (36.8 C)  SpO2: 97%  Weight: 171 lb (77.6 kg)  Height: 5\' 4"  (1.626 m)   Body mass index is 29.35 kg/m.  Physical Exam  GENERAL APPEARANCE: Alert, No acute distress.  HEENT: Unremarkable. RESPIRATORY: Breathing is even, unlabored. Lung sounds are clear   CARDIOVASCULAR: Heart RRR no murmurs, rubs or gallops. No peripheral edema.  GASTROINTESTINAL: Abdomen is soft, non-tender, not distended w/ normal bowel sounds.  NEUROLOGIC: Cranial nerves 2-12 grossly intact. Moves all extremities   Labs reviewed: Basic Metabolic Panel:  Recent Labs  96/09/5404/25/18 0937 12/23/16 0451 12/24/16 0524  NA 145 141 141  K 2.9* 3.6 3.3*  CL 110 108 107  CO2 28 26 26   GLUCOSE 95 104* 97  BUN 22* 18 15  CREATININE 0.90 0.94 1.02  CALCIUM 8.0* 7.8* 8.1*  MG 2.0 1.7 2.1  PHOS 3.0 3.4 3.2   No results found for: [facility] Liver Function Tests:  Recent Labs  12/22/16 0937 12/23/16 0451 12/24/16 0524  AST 31 19 24     ALT 32 24 24  ALKPHOS 59 57 58  BILITOT 0.8 0.6 0.7  PROT 5.6* 5.1* 5.7*  ALBUMIN 2.4* 2.2* 2.5*    Recent Labs  12/17/16 1154 12/18/16 0327  LIPASE 32 32   No results for input(s): AMMONIA in the last 8760 hours. CBC:  Recent Labs  12/22/16 0937 12/23/16 0451 12/24/16 0524  WBC 7.7 6.8 7.2  NEUTROABS 6.0 4.9 5.3  HGB 12.7*  11.9* 12.5*  HCT 37.9* 36.0* 36.6*  MCV 90.9 91.1 88.6  PLT 254 278 288   Lipid No results for input(s): CHOL, HDL, LDLCALC, TRIG in the last 8760 hours. Cardiac Enzymes: No results for input(s): CKTOTAL, CKMB, CKMBINDEX, TROPONINI in the last 8760 hours. BNP: No results for input(s): BNP in the last 8760 hours. CBG: No results for input(s): GLUCAP in the last 8760 hours.  Procedures and Imaging Studies During Stay: Dg Chest 2 View  Result Date: 12/17/2016 CLINICAL DATA:  Sepsis.  Lethargic. EXAM: CHEST  2 VIEW COMPARISON:  None FINDINGS: Two views of the chest demonstrate low lung volumes. No focal airspace disease or frank pulmonary edema. Prior median sternotomy. Surgical hardware in the lumbar spine. Aortic atherosclerosis. Heart size is within normal limits. IMPRESSION: Low lung volumes.  No acute findings. Electronically Signed   By: Richarda Overlie M.D.   On: 12/17/2016 13:17   Dg Cholangiogram Operative  Result Date: 12/19/2016 CLINICAL DATA:  83 year old male with a history of cholelithiasis EXAM: INTRAOPERATIVE CHOLANGIOGRAM TECHNIQUE: Cholangiographic images from the C-arm fluoroscopic device were submitted for interpretation post-operatively. Please see the procedural report for the amount of contrast and the fluoroscopy time utilized. COMPARISON:  None. FINDINGS: Surgical instruments project over the upper abdomen. There is cannulation of the cystic duct/gallbladder neck, with antegrade infusion of contrast. Caliber of the extrahepatic ductal system unremarkable No filling defect identified. Contrast is not observed to cross the ampulla  IMPRESSION: Intraoperative cholangiogram demonstrates extrahepatic biliary ducts of unremarkable caliber, with no large filling defect identified. Note that contrast was not observed to cross the ampulla. Please refer to the dictated operative report for full details of intraoperative findings and procedure. Electronically Signed   By: Gilmer Mor D.O.   On: 12/19/2016 13:52   Ct Head Wo Contrast  Result Date: 12/17/2016 CLINICAL DATA:  Sepsis EXAM: CT HEAD WITHOUT CONTRAST TECHNIQUE: Contiguous axial images were obtained from the base of the skull through the vertex without intravenous contrast. COMPARISON:  None. FINDINGS: Brain: Moderate atrophy. Negative for hydrocephalus. Mild chronic microvascular ischemic change in the white matter and basal ganglia. Negative for acute infarct.  Negative for hemorrhage or mass. Vascular: Negative for hyperdense vessel Skull: Negative Sinuses/Orbits: Mucous retention cyst right maxillary sinus. Air-fluid level in the sphenoid sinus. Other: None IMPRESSION: Atrophy and chronic microvascular ischemic change. No acute intracranial abnormality Air-fluid level sphenoid sinus. Electronically Signed   By: Marlan Palau M.D.   On: 12/17/2016 12:37   Ct Abdomen Pelvis W Contrast  Result Date: 12/17/2016 CLINICAL DATA:  Upper quadrant pain. EXAM: CT ABDOMEN AND PELVIS WITH CONTRAST TECHNIQUE: Multidetector CT imaging of the abdomen and pelvis was performed using the standard protocol following bolus administration of intravenous contrast. CONTRAST:  75mL ISOVUE-300 IOPAMIDOL (ISOVUE-300) INJECTION 61% COMPARISON:  None available FINDINGS: Lower chest: Trace bilateral effusions greater on the RIGHT. Hepatobiliary: Several low-density lesions in liver consistent benign hepatic cyst. No biliary duct dilatation. Gallbladder is distended to 4 cm. There is pericholecystic inflammation in the fat adjacent to the gallbladder. There is a calculus in the neck of the gallbladder  measuring 10 mm (image 25, series 3). Small amount pericholecystic fluid. The distal common bile duct is upper limits normal at 6 mm. No common bile duct stone identified by CT. Pancreas: No pancreatic inflammation or duct dilatation. Spleen: Normal spleen Adrenals/urinary tract: Adrenal glands and kidneys are normal. The ureters and bladder normal. Stomach/Bowel: Stomach and small bowel are normal. Appendix not identified. Surgical clips  in the cecal region. Colon rectosigmoid colon normal. Vascular/Lymphatic: Abdominal aorta is normal caliber with atherosclerotic calcification. There is no retroperitoneal or periportal lymphadenopathy. No pelvic lymphadenopathy. Reproductive: Prostate normal Other: No free fluid. Musculoskeletal: No aggressive osseous lesion. IMPRESSION: 1. Findings consistent with acute cholecystitis. 2. Common bile duct upper limits of normal. 3.  Aortic Atherosclerosis (ICD10-I70.0). Electronically Signed   By: Genevive Bi M.D.   On: 12/17/2016 14:35    Assessment/Plan:   Pericholecystic abscess  Cholecystitis  Cholangitis  Acute gangrenous cholecystitis s/p lap cholecystectomy 12/19/2016  Severe sepsis [facility])  Pericholecystic abscess s/p OR drainage 12/19/2016  Lactic acidosis  AKI (acute kidney injury) (HCC)  Sinus bradycardia  Essential hypertension  Coronary artery disease involving coronary bypass graft of native heart without angina pectoris  Hyperlipidemia, unspecified hyperlipidemia type  Dementia associated with other underlying disease without behavioral disturbance  Gastroesophageal reflux disease without esophagitis   Patient is being discharged with the following home health services:  OT/PT/Nursing  Patient is being discharged with the following durable medical equipment: none   Patient has been advised to f/u with their PCP in 1-2 weeks to bring them up to date on their rehab stay.  Social services at facility was responsible for arranging  this appointment.  Pt was provided with a 30 day supply of prescriptions for medications and refills must be obtained from their PCP.  For controlled substances, a more limited supply may be provided adequate until PCP appointment only.  Medications have been reconciled.  Time spent >30 min;> 50% of time with patient was spent reviewing records, labs, tests and studies, counseling and developing plan of care  Merrilee Seashore, MD

## 2017-02-14 ENCOUNTER — Emergency Department (HOSPITAL_COMMUNITY)
Admission: EM | Admit: 2017-02-14 | Discharge: 2017-02-15 | Disposition: A | Discharge status: Home / Self Care | Payer: Medicare Other | Attending: Emergency Medicine | Admitting: Emergency Medicine

## 2017-02-14 DIAGNOSIS — G301 Alzheimer's disease with late onset: Secondary | ICD-10-CM | POA: Diagnosis not present

## 2017-02-14 DIAGNOSIS — F0391 Unspecified dementia with behavioral disturbance: Secondary | ICD-10-CM | POA: Diagnosis present

## 2017-02-14 DIAGNOSIS — F0280 Dementia in other diseases classified elsewhere without behavioral disturbance: Secondary | ICD-10-CM | POA: Diagnosis present

## 2017-02-14 DIAGNOSIS — G309 Alzheimer's disease, unspecified: Secondary | ICD-10-CM

## 2017-02-14 DIAGNOSIS — F03918 Unspecified dementia, unspecified severity, with other behavioral disturbance: Secondary | ICD-10-CM

## 2017-02-14 DIAGNOSIS — F0281 Dementia in other diseases classified elsewhere with behavioral disturbance: Secondary | ICD-10-CM | POA: Diagnosis not present

## 2017-02-14 LAB — CBC WITH DIFFERENTIAL/PLATELET
Basophils Absolute: 0 10*3/uL (ref 0.0–0.1)
Basophils Relative: 0 %
Eosinophils Absolute: 0.1 10*3/uL (ref 0.0–0.7)
Eosinophils Relative: 1 %
HCT: 40.1 % (ref 39.0–52.0)
Hemoglobin: 13.5 g/dL (ref 13.0–17.0)
Lymphocytes Relative: 22 %
Lymphs Abs: 1.1 10*3/uL (ref 0.7–4.0)
MCH: 30.3 pg (ref 26.0–34.0)
MCHC: 33.7 g/dL (ref 30.0–36.0)
MCV: 89.9 fL (ref 78.0–100.0)
Monocytes Absolute: 0.5 10*3/uL (ref 0.1–1.0)
Monocytes Relative: 10 %
Neutro Abs: 3.2 10*3/uL (ref 1.7–7.7)
Neutrophils Relative %: 67 %
Platelets: 188 10*3/uL (ref 150–400)
RBC: 4.46 MIL/uL (ref 4.22–5.81)
RDW: 13.1 % (ref 11.5–15.5)
WBC: 4.9 10*3/uL (ref 4.0–10.5)

## 2017-02-14 LAB — URINALYSIS, ROUTINE W REFLEX MICROSCOPIC
Bilirubin Urine: NEGATIVE
Glucose, UA: NEGATIVE mg/dL
Hgb urine dipstick: NEGATIVE
Ketones, ur: NEGATIVE mg/dL
Leukocytes, UA: NEGATIVE
Nitrite: NEGATIVE
Protein, ur: NEGATIVE mg/dL
Specific Gravity, Urine: 1.005 (ref 1.005–1.030)
pH: 7 (ref 5.0–8.0)

## 2017-02-14 LAB — COMPREHENSIVE METABOLIC PANEL
ALT: 14 U/L — ABNORMAL LOW (ref 17–63)
AST: 19 U/L (ref 15–41)
Albumin: 3.5 g/dL (ref 3.5–5.0)
Alkaline Phosphatase: 117 U/L (ref 38–126)
Anion gap: 7 (ref 5–15)
BUN: 13 mg/dL (ref 6–20)
CO2: 24 mmol/L (ref 22–32)
Calcium: 8.5 mg/dL — ABNORMAL LOW (ref 8.9–10.3)
Chloride: 108 mmol/L (ref 101–111)
Creatinine, Ser: 0.9 mg/dL (ref 0.61–1.24)
GFR calc Af Amer: >60 mL/min (ref >60)
GFR calc non Af Amer: >60 mL/min (ref >60)
Glucose, Bld: 101 mg/dL — ABNORMAL HIGH (ref 65–99)
Potassium: 4 mmol/L (ref 3.5–5.1)
Sodium: 139 mmol/L (ref 135–145)
Total Bilirubin: 0.7 mg/dL (ref 0.3–1.2)
Total Protein: 6.6 g/dL (ref 6.5–8.1)

## 2017-02-14 NOTE — ED Notes (Addendum)
Per TTS, patient is awaiting gero psych placement. Family at bedside made aware.

## 2017-02-14 NOTE — ED Provider Notes (Signed)
[facility]-EMERGENCY DEPT Provider Note   CSN: [id number] Arrival date & time: 02/14/17  1354     History   Chief Complaint No chief complaint on file.   HPI Travis Wallace is a 83 y.o. male Level V caveat secondary to patient's Alzheimer's dementia.  I spoke with the program coordinator, Horald Chestnut, at patient's [name], [facility], who reported that patient was at lunch today and began becoming verbally aggressive and telling everyone they needed to get out of the dining room. Despite staff trying to pull him aside incompetent down, patient continued to yell and tried to verbally abuse that cook in the kitchen. It took 3 staff members to keep him away from the door and calm him down. Patient is normally calm and walks around and does not bother anyone. Patient's had an emergency cholecystectomy on 12/19/2016 and was in rehabilitation until about a month ago. The program coronary reports that since his surgery he has become more aggressive, but has been worse in the past couple days. He has been aggressive towards staff on every shift, especially at night.    HPI  Past Medical History:  Diagnosis Date  . Acute gangrenous cholecystitis 12/19/2016   s/p lap cholecystectomy 12/19/16  . Acute kidney injury (HCC)   . Alzheimer's dementia   . Alzheimer's dementia   . CAD (coronary artery disease) 12/17/2016  . Coronary artery disease   . Dementia 12/17/2016  . Depression   . Dyslipidemia   . GERD (gastroesophageal reflux disease)   . Hyperbilirubinemia   . Hypertension   . Insomnia   . OSA (obstructive sleep apnea) 12/17/2016  . Pericholecystic abscess 12/17/2016   s/p OR drainage 12/19/16  . Restless leg   . Severe sepsis (HCC) 12/17/2016  . Sinus bradycardia   . Sleep apnea     Patient Active Problem List   Diagnosis Date Noted  . Lactic acidosis 12/27/2016  . HLD (hyperlipidemia) 12/27/2016  . Pericholecystic abscess s/p OR drainage 12/19/2016 12/21/2016  . Delirium  12/21/2016  . Alzheimer's dementia   . Hypertension   . GERD (gastroesophageal reflux disease)   . Sleep apnea   . Restless leg   . Insomnia   . Sinus bradycardia 12/19/2016  . Hyperbilirubinemia 12/19/2016  . AKI (acute kidney injury) (HCC) 12/19/2016  . Altered mental status   . Cholecystitis   . Acute gangrenous cholecystitis s/p lap cholecystectomy 12/19/2016 12/17/2016  . Cholangitis 12/17/2016  . Severe sepsis (HCC) 12/17/2016  . CAD (coronary artery disease) 12/17/2016  . HTN (hypertension) 12/17/2016  . OSA (obstructive sleep apnea) 12/17/2016  . Sepsis (HCC) 12/17/2016  . Dementia 12/17/2016  . Pericholecystic abscess 12/17/2016    Past Surgical History:  Procedure Laterality Date  . BACK SURGERY     x2  . CARDIAC CATHETERIZATION    . CATARACT EXTRACTION, BILATERAL    . CHOLECYSTECTOMY N/A 12/19/2016   Procedure: LAPAROSCOPIC CHOLECYSTECTOMY WITH INTRAOPERATIVE CHOLANGIOGRAM;  Surgeon: Ovidio Kin, MD;  Location: [facility] ORS;  Service: General;  Laterality: N/A;  . CORONARY ARTERY BYPASS GRAFT     X 3 VESSELS  . EYE SURGERY         Home Medications    Prior to Admission medications   Medication Sig Start Date End Date Taking? Authorizing Provider  acetaminophen (TYLENOL) 325 MG tablet Take 2 tablets (650 mg total) by mouth every 6 (six) hours as needed for mild pain, moderate pain or fever. 12/25/16   Hongalgi, Maximino Greenland, MD  amLODipine-benazepril (LOTREL)  5-20 MG capsule Take 1 capsule by mouth daily. 11/03/14   [provider]  aspirin EC 81 MG tablet Take 81 mg by mouth daily.    [provider]  atorvastatin (LIPITOR) 20 MG tablet Take 20 mg by mouth at bedtime.    [provider]  cetirizine (ZYRTEC) 10 MG tablet Take 10 mg by mouth daily.    [provider]  doxazosin (CARDURA) 4 MG tablet Take 4 mg by mouth daily.    [provider]  lactulose (CHRONULAC) 10 GM/15ML solution Take 30 mLs by mouth daily. 11/13/16    [provider]  lansoprazole (PREVACID) 30 MG capsule Take 30 mg by mouth daily.    [provider]  magnesium gluconate (MAGONATE) 500 MG tablet Take 500 mg by mouth daily.    [provider]  niacin 500 MG tablet Take 500 mg by mouth daily.    [provider]  psyllium (HYDROCIL/METAMUCIL) 95 % PACK Take 1 packet by mouth daily. 12/26/16   Hongalgi, Maximino Greenland, MD  QUEtiapine (SEROQUEL) 25 MG tablet Take 75 mg by mouth at bedtime. 12/05/16   [provider]  QUEtiapine (SEROQUEL) 25 MG tablet Take 25 mg by mouth daily as needed (restlessness).    [provider]  REQUIP 1 MG tablet Take 1 mg by mouth at bedtime.  11/07/16   [provider]    Family History Family History  Problem Relation Age of Onset  . Pancreatic cancer Mother   . Hypertension Father   . CAD Father   . Stroke Father   . CAD Sister   . Diabetes Neg Hx     Social History Social History  Substance Use Topics  . Smoking status: Never Smoker  . Smokeless tobacco: Never Used  . Alcohol use No     Allergies   Memantine   Review of Systems Review of Systems  Unable to perform ROS: Dementia     Physical Exam Updated Vital Signs BP 140/60 (BP Location: Left Arm)   Pulse (!) 46   Temp 97.6 F (36.4 C) (Oral)   Resp 16   SpO2 97%   Physical Exam  Constitutional: He appears well-developed and well-nourished. No distress.  HENT:  Head: Normocephalic and atraumatic.  Mouth/Throat: Oropharynx is clear and moist. No oropharyngeal exudate.  Eyes: Pupils are equal, round, and reactive to light. Conjunctivae are normal. Right eye exhibits no discharge. Left eye exhibits no discharge. No scleral icterus.  Neck: Normal range of motion. Neck supple. No thyromegaly present.  Cardiovascular: Normal rate, regular rhythm, normal heart sounds and intact distal pulses.  Exam reveals no gallop and no friction rub.   No murmur heard. Pulmonary/Chest: Effort  normal and breath sounds normal. No stridor. No respiratory distress. He has no wheezes. He has no rales.  Abdominal: Soft. Bowel sounds are normal. He exhibits no distension. There is no tenderness. There is no rebound and no guarding.  Musculoskeletal: He exhibits no edema.  Lymphadenopathy:    He has no cervical adenopathy.  Neurological: He is alert. Coordination normal.  Only oriented to self, baseline  Skin: Skin is warm and dry. No rash noted. He is not diaphoretic. No pallor.  Psychiatric: He has a normal mood and affect.  Nursing note and vitals reviewed.    ED Treatments / Results  Labs (all labs ordered are listed, but only abnormal results are displayed) Labs Reviewed  URINALYSIS, ROUTINE W REFLEX MICROSCOPIC - Abnormal; Notable for the  following:       Result Value   Color, Urine STRAW (*)    All other components within normal limits  COMPREHENSIVE METABOLIC PANEL - Abnormal; Notable for the following:    Glucose, Bld 101 (*)    Calcium 8.5 (*)    ALT 14 (*)    All other components within normal limits  CBC WITH DIFFERENTIAL/PLATELET    EKG  EKG Interpretation None       Radiology No results found.  Procedures Procedures (including critical care time)  Medications Ordered in ED Medications - No data to display   Initial Impression / Assessment and Plan / ED Course  I have reviewed the triage vital signs and the nursing notes.  Pertinent labs & imaging results that were available during my care of the patient were reviewed by me and considered in my medical decision making (see chart for details).     Patient with progressive increase in aggressive behavior. CBC, CMP, UA negative. No medical cause found for increasing aggressive behavior. TTS consulted and recommends geriatric psych placement. Placement pending. Plan discussed with daughter who is in agreement with plan. I discussed patient case with Dr. Dalene Seltzer who guided the patient's management  and agrees with plan.  Final Clinical Impressions(s) / ED Diagnoses   Final diagnoses:  Dementia with aggressive behavior    New Prescriptions New Prescriptions   No medications on file     Verdis Prime 02/14/17 2248    Alvira Monday, MD 02/16/17 1227

## 2017-02-14 NOTE — ED Notes (Signed)
Informed family of update on TTS, pt is resting comfortably, no distress, family at bedside.

## 2017-02-14 NOTE — ED Notes (Signed)
Called TTS and left a voice mail (no answer) to inquire about when they will be coming to evaluate patient, per Loree Fee request.

## 2017-02-14 NOTE — ED Notes (Signed)
Pt ambulating in room by self and had episode of incontinence. Pt cleaned, dressed in hospital gown and assessed back to the stretcher. Pt cooperative and calm with staff. Needed frequent verbal cues throughout process.

## 2017-02-14 NOTE — ED Notes (Signed)
TTS at bedside. 
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
## 2017-02-14 NOTE — BH Assessment (Signed)
[facility] Assessment Progress Note Writer attempted to assess at 1821 but nursing staff was preforming medical procedures on patient.
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
## 2017-02-15 DIAGNOSIS — F0281 Dementia in other diseases classified elsewhere with behavioral disturbance: Secondary | ICD-10-CM

## 2017-02-15 DIAGNOSIS — F0391 Unspecified dementia with behavioral disturbance: Secondary | ICD-10-CM | POA: Diagnosis not present

## 2017-02-15 DIAGNOSIS — G301 Alzheimer's disease with late onset: Secondary | ICD-10-CM

## 2017-02-15 MED ORDER — QUETIAPINE FUMARATE 25 MG PO TABS: 25.0000 mg | ORAL_TABLET | Freq: Two times a day (BID) | ORAL | Status: DC

## 2017-02-15 MED ORDER — QUETIAPINE FUMARATE 100 MG PO TABS: 100.0000 mg | ORAL_TABLET | Freq: Every day | ORAL | 0 refills | Status: DC

## 2017-02-15 MED ORDER — QUETIAPINE FUMARATE 100 MG PO TABS: 100.0000 mg | ORAL_TABLET | Freq: Every day | ORAL | Status: DC

## 2017-02-15 NOTE — [facility] Suicide Risk Assessment (Signed)
Suicide Risk Assessment  Discharge Assessment   [facility] Discharge Suicide Risk Assessment   Principal Problem: Alzheimer's dementia Discharge Diagnoses:  Patient Active Problem List   Diagnosis Date Noted  . Lactic acidosis [E87.2] 12/27/2016  . HLD (hyperlipidemia) [E78.5] 12/27/2016  . Pericholecystic abscess s/p OR drainage 12/19/2016 [K65.1] 12/21/2016  . Delirium [R41.0] 12/21/2016  . Alzheimer's dementia [G30.9, F02.80]   . Hypertension [I10]   . GERD (gastroesophageal reflux disease) [K21.9]   . Sleep apnea [G47.30]   . Restless leg [G25.81]   . Insomnia [G47.00]   . Sinus bradycardia [R00.1] 12/19/2016  . Hyperbilirubinemia [E80.6] 12/19/2016  . AKI (acute kidney injury) (HCC) [N17.9] 12/19/2016  . Altered mental status [R41.82]   . Cholecystitis [K81.9]   . Acute gangrenous cholecystitis s/p lap cholecystectomy 12/19/2016 [K81.0] 12/17/2016  . Cholangitis [K83.0] 12/17/2016  . Severe sepsis (HCC) [A41.9, R65.20] 12/17/2016  . CAD (coronary artery disease) [I25.10] 12/17/2016  . HTN (hypertension) [I10] 12/17/2016  . OSA (obstructive sleep apnea) [G47.33] 12/17/2016  . Sepsis (HCC) [A41.9] 12/17/2016  . Dementia with aggressive behavior [F03.91] 12/17/2016  . Pericholecystic abscess [K81.0] 12/17/2016    Total Time spent with patient: 45 minutes  Musculoskeletal: Strength & Muscle Tone: within normal limits Gait & Station: normal Patient leans: N/A  Psychiatric Specialty Exam: Physical Exam  Constitutional: He appears well-developed and well-nourished.  HENT:  Head: Normocephalic.  Respiratory: Effort normal.  Musculoskeletal: Normal range of motion.  Neurological: He is alert.   Review of Systems  Psychiatric/Behavioral: Positive for memory loss. Negative for depression, hallucinations, substance abuse and suicidal ideas. The patient is not nervous/anxious and does not have insomnia.    Blood pressure (!) 118/58, pulse 84, temperature 97.8 F (36.6 C),  temperature source Oral, resp. rate 18, SpO2 92 %.There is no height or weight on file to calculate BMI. General Appearance: Disheveled Eye Contact:  Fair Speech:  Clear and Coherent Volume:  Normal Mood:  dementia Affect:  Congruent Thought Process:  Disorganized Orientation:  Other:  dementia Thought Content:  Illogical and dementia Suicidal Thoughts:  No Homicidal Thoughts:  No Memory:  Immediate;   dementia Recent;   NA Remote;   NA Judgement:  Other:  dementia Insight:  dementia Psychomotor Activity:  did not observe Concentration:  Concentration: dementia and Attention Span: dementia Recall:  dementia Fund of Knowledge:  Poor Language:  Good Akathisia:  No Handed:  Right AIMS (if indicated):    Assets:  Architect Housing Social Support ADL's:  Intact Cognition:  WNL  Mental Status Per Nursing Assessment::   On Admission:   Confused  Demographic Factors:  Male, Age 83 or older and Caucasian  Loss Factors: Decline in physical health  Historical Factors: Impulsivity  Risk Reduction Factors:   Sense of responsibility to family, Living with another person, especially a relative and Positive social support  Continued Clinical Symptoms:  Severe Anxiety and/or Agitation More than one psychiatric diagnosis Medical Diagnoses and Treatments/Surgeries  Alzheimer's Dementia  Cognitive Features That Contribute To Risk:  Loss of executive function    Suicide Risk:  Minimal: No identifiable suicidal ideation.  Patients presenting with no risk factors but with morbid ruminations; may be classified as minimal risk based on the severity of the depressive symptoms    Plan Of Care/Follow-up recommendations:  Activity:  as tolerated Diet:  Heart Healthy  Laveda Abbe, NP 02/15/2017, 1:03 PM

## 2017-02-15 NOTE — Consult Note (Signed)
[facility] Consult   Reason for Consult:  Aggressive behavior related to dementia Referring Physician:  EDP Patient Identification: Travis Wallace MRN:  [id number] Principal Diagnosis: Alzheimer's dementia Diagnosis:   Patient Active Problem List   Diagnosis Date Noted  . Lactic acidosis [E87.2] 12/27/2016  . HLD (hyperlipidemia) [E78.5] 12/27/2016  . Pericholecystic abscess s/p OR drainage 12/19/2016 [K65.1] 12/21/2016  . Delirium [R41.0] 12/21/2016  . Alzheimer's dementia [G30.9, F02.80]   . Hypertension [I10]   . GERD (gastroesophageal reflux disease) [K21.9]   . Sleep apnea [G47.30]   . Restless leg [G25.81]   . Insomnia [G47.00]   . Sinus bradycardia [R00.1] 12/19/2016  . Hyperbilirubinemia [E80.6] 12/19/2016  . AKI (acute kidney injury) ([place]) [N17.9] 12/19/2016  . Altered mental status [R41.82]   . Cholecystitis [K81.9]   . Acute gangrenous cholecystitis s/p lap cholecystectomy 12/19/2016 [K81.0] 12/17/2016  . Cholangitis [K83.0] 12/17/2016  . Severe sepsis ([place]) [A41.9, R65.20] 12/17/2016  . CAD (coronary artery disease) [I25.10] 12/17/2016  . HTN (hypertension) [I10] 12/17/2016  . OSA (obstructive sleep apnea) [G47.33] 12/17/2016  . Sepsis ([place]) [A41.9] 12/17/2016  . Dementia with aggressive behavior [F03.91] 12/17/2016  . Pericholecystic abscess [K81.0] 12/17/2016    Total Time spent with patient: 45 minutes  Subjective:   Travis Wallace is a 83 y.o. male patient admitted with aggressive behavior.  HPI:  Pt was seen and chart reviewed with treatment team. Pt admitted from [facility] in [facility] with advanced dementia and aggressive behavior. Medication adjustments were made. Pt was calm in the emergency room. Pt is psychiatrically cleared for discharge back to memory care unit at [facility]   Past Psychiatric History: As above  Risk to Self: Suicidal Ideation: No Suicidal Intent: No Is patient at risk for suicide?: No Suicidal Plan?: No Access to Means:  No What has been your use of drugs/alcohol within the last 12 months?: denies use  Triggers for Past Attempts: None known Intentional Self Injurious Behavior: None Risk to Others: Homicidal Ideation: No Thoughts of Harm to Others: No-Not Currently Present/Within Last 6 Months Current Homicidal Intent: No Current Homicidal Plan: No Access to Homicidal Means: No History of harm to others?: Yes Assessment of Violence: On admission Violent Behavior Description: per [place] [facility] pt has swat and made threats to memory care home staff  Does patient have access to weapons?: No Criminal Charges Pending?: No Does patient have a court date: No Prior Inpatient Therapy: Prior Inpatient Therapy: No Prior Outpatient Therapy: Prior Outpatient Therapy: No Does patient have an ACCT team?: No Does patient have Intensive In-House Services?  : No Does patient have [facility] services? : No Does patient have P4CC services?: No  Past Medical History:  Past Medical History:  Diagnosis Date  . Acute gangrenous cholecystitis 12/19/2016   s/p lap cholecystectomy 12/19/16  . Acute kidney injury ([place])   . Alzheimer's dementia   . Alzheimer's dementia   . CAD (coronary artery disease) 12/17/2016  . Coronary artery disease   . Dementia 12/17/2016  . Depression   . Dyslipidemia   . GERD (gastroesophageal reflux disease)   . Hyperbilirubinemia   . Hypertension   . Insomnia   . OSA (obstructive sleep apnea) 12/17/2016  . Pericholecystic abscess 12/17/2016   s/p OR drainage 12/19/16  . Restless leg   . Severe sepsis ([place]) 12/17/2016  . Sinus bradycardia   . Sleep apnea     Past Surgical History:  Procedure Laterality Date  . BACK SURGERY     x2  .  CARDIAC CATHETERIZATION    . CATARACT EXTRACTION, BILATERAL    . CHOLECYSTECTOMY N/A 12/19/2016   Procedure: LAPAROSCOPIC CHOLECYSTECTOMY WITH INTRAOPERATIVE CHOLANGIOGRAM;  Surgeon: Alphonsa Overall, MD;  Location: [facility] ORS;  Service: General;  Laterality: N/A;   . CORONARY ARTERY BYPASS GRAFT     X 3 VESSELS  . EYE SURGERY     Family History:  Family History  Problem Relation Age of Onset  . Pancreatic cancer Mother   . Hypertension Father   . CAD Father   . Stroke Father   . CAD Sister   . Diabetes Neg Hx    Family Psychiatric  History: Unknown Social History:  History  Alcohol Use No     History  Drug Use No    Social History   Social History  . Marital status: Married    Spouse name: N/A  . Number of children: N/A  . Years of education: N/A   Occupational History  . retired Therapist, art    Social History Main Topics  . Smoking status: Never Smoker  . Smokeless tobacco: Never Used  . Alcohol use No  . Drug use: No  . Sexual activity: No   Other Topics Concern  . Not on file   Social History Narrative   Admitted to [facility]   Married   Never smoked   Alcohol none   Full Code   Additional Social History:    Allergies:   Allergies  Allergen Reactions  . Memantine Other (See Comments)    Reaction:  Bradycardia     Labs:  Results for orders placed or performed during the hospital encounter of 02/14/17 (from the past 48 hour(s))  Urinalysis, Routine w reflex microscopic     Status: Abnormal   Collection Time: 02/14/17  2:39 PM  Result Value Ref Range   Color, Urine STRAW (A) YELLOW   APPearance CLEAR CLEAR   Specific Gravity, Urine 1.005 1.005 - 1.030   pH 7.0 5.0 - 8.0   Glucose, UA NEGATIVE NEGATIVE mg/dL   Hgb urine dipstick NEGATIVE NEGATIVE   Bilirubin Urine NEGATIVE NEGATIVE   Ketones, ur NEGATIVE NEGATIVE mg/dL   Protein, ur NEGATIVE NEGATIVE mg/dL   Nitrite NEGATIVE NEGATIVE   Leukocytes, UA NEGATIVE NEGATIVE  Comprehensive metabolic panel     Status: Abnormal   Collection Time: 02/14/17  2:52 PM  Result Value Ref Range   Sodium 139 135 - 145 mmol/L   Potassium 4.0 3.5 - 5.1 mmol/L   Chloride 108 101 - 111 mmol/L   CO2 24 22 - 32 mmol/L   Glucose, Bld 101 (H) 65 -  99 mg/dL   BUN 13 6 - 20 mg/dL   Creatinine, Ser 0.90 0.61 - 1.24 mg/dL   Calcium 8.5 (L) 8.9 - 10.3 mg/dL   Total Protein 6.6 6.5 - 8.1 g/dL   Albumin 3.5 3.5 - 5.0 g/dL   AST 19 15 - 41 U/L   ALT 14 (L) 17 - 63 U/L   Alkaline Phosphatase 117 38 - 126 U/L   Total Bilirubin 0.7 0.3 - 1.2 mg/dL   GFR calc non Af Amer >60 >60 mL/min   GFR calc Af Amer >60 >60 mL/min    Comment: (NOTE) The eGFR has been calculated using the CKD EPI equation. This calculation has not been validated in all clinical situations. eGFR's persistently <60 mL/min signify possible Chronic Kidney Disease.    Anion gap 7 5 - 15  CBC with Differential  Status: None   Collection Time: 02/14/17  2:52 PM  Result Value Ref Range   WBC 4.9 4.0 - 10.5 K/uL   RBC 4.46 4.22 - 5.81 MIL/uL   Hemoglobin 13.5 13.0 - 17.0 g/dL   HCT 40.1 39.0 - 52.0 %   MCV 89.9 78.0 - 100.0 fL   MCH 30.3 26.0 - 34.0 pg   MCHC 33.7 30.0 - 36.0 g/dL   RDW 13.1 11.5 - 15.5 %   Platelets 188 150 - 400 K/uL   Neutrophils Relative % 67 %   Neutro Abs 3.2 1.7 - 7.7 K/uL   Lymphocytes Relative 22 %   Lymphs Abs 1.1 0.7 - 4.0 K/uL   Monocytes Relative 10 %   Monocytes Absolute 0.5 0.1 - 1.0 K/uL   Eosinophils Relative 1 %   Eosinophils Absolute 0.1 0.0 - 0.7 K/uL   Basophils Relative 0 %   Basophils Absolute 0.0 0.0 - 0.1 K/uL    Current Facility-Administered Medications  Medication Dose Route Frequency Provider Last Rate Last Dose  . QUEtiapine (SEROQUEL) tablet 100 mg  100 mg Oral QHS Ethelene Hal, NP      . QUEtiapine (SEROQUEL) tablet 25 mg  25 mg Oral BID Ethelene Hal, NP       Current Outpatient Prescriptions  Medication Sig Dispense Refill  . acetaminophen (TYLENOL) 325 MG tablet Take 2 tablets (650 mg total) by mouth every 6 (six) hours as needed for mild pain, moderate pain or fever.    Marland Kitchen amLODipine-benazepril (LOTREL) 5-20 MG capsule Take 1 capsule by mouth daily.    Marland Kitchen aspirin EC 81 MG tablet Take 81 mg  by mouth daily.    Marland Kitchen atorvastatin (LIPITOR) 20 MG tablet Take 20 mg by mouth at bedtime.    . cetirizine (ZYRTEC) 10 MG tablet Take 10 mg by mouth daily.    Marland Kitchen doxazosin (CARDURA) 4 MG tablet Take 4 mg by mouth daily.    Marland Kitchen lactulose (CHRONULAC) 10 GM/15ML solution Take 30 mLs by mouth daily as needed for mild constipation or moderate constipation.   12  . lansoprazole (PREVACID) 30 MG capsule Take 30 mg by mouth daily.    . magnesium gluconate (MAGONATE) 500 MG tablet Take 500 mg by mouth daily.    . niacin 500 MG tablet Take 500 mg by mouth daily.    . psyllium (HYDROCIL/METAMUCIL) 95 % PACK Take 1 packet by mouth daily.    . QUEtiapine (SEROQUEL) 25 MG tablet Take 25-75 mg by mouth See admin instructions. Pt takes one tablet twice daily and three tablets at bedtime.   Pt is also able to take one tablet daily as needed for restlessness.    Marland Kitchen rOPINIRole (REQUIP) 1 MG tablet Take 1 mg by mouth at bedtime.    Marland Kitchen QUEtiapine (SEROQUEL) 100 MG tablet Take 1 tablet (100 mg total) by mouth at bedtime. 30 tablet 0  . QUEtiapine (SEROQUEL) 25 MG tablet Take 1 tablet (25 mg total) by mouth 2 (two) times daily.      Musculoskeletal: Strength & Muscle Tone: within normal limits Gait & Station: normal Patient leans: N/A  Psychiatric Specialty Exam: Physical Exam  Constitutional: He appears well-developed and well-nourished.  HENT:  Head: Normocephalic.  Respiratory: Effort normal.  Musculoskeletal: Normal range of motion.  Neurological: He is alert.    Review of Systems  Psychiatric/Behavioral: Positive for memory loss. Negative for depression, hallucinations, substance abuse and suicidal ideas. The patient is not nervous/anxious and does not  have insomnia.     Blood pressure (!) 118/58, pulse 84, temperature 97.8 F (36.6 C), temperature source Oral, resp. rate 18, SpO2 92 %.There is no height or weight on file to calculate BMI.  General Appearance: Disheveled  Eye Contact:  Fair  Speech:   Clear and Coherent  Volume:  Normal  Mood:  dementia  Affect:  Congruent  Thought Process:  Disorganized  Orientation:  Other:  dementia  Thought Content:  Illogical and dementia  Suicidal Thoughts:  No  Homicidal Thoughts:  No  Memory:  Immediate;   dementia Recent;   NA Remote;   NA  Judgement:  Other:  dementia  Insight:  dementia  Psychomotor Activity:  did not observe  Concentration:  Concentration: dementia and Attention Span: dementia  Recall:  dementia  Fund of Knowledge:  Poor  Language:  Good  Akathisia:  No  Handed:  Right  AIMS (if indicated):     Assets:  Agricultural consultant Housing Social Support  ADL's:  Intact  Cognition:  WNL  Sleep:        Treatment Plan Summary: Plan Alzheimers dementia  Take all medications as prescribed.  Follow up with [facility] provider for any further medication adjustments and health issues.   Disposition: No evidence of imminent risk to self or others at present.   Patient does not meet criteria for psychiatric inpatient admission. Supportive therapy provided about ongoing stressors.  Ethelene Hal, NP 02/15/2017 12:54 PM

## 2017-02-15 NOTE — Progress Notes (Signed)
CSW contacted patient's [name] [facility] [phone]) and informed staff that patient was psychiatrically cleared to be discharged back . Staff requested that CSW fax after visit summary. CSW agreed to fax requested document. CSW spoke with patient's daughter at bedside regarding disposition, patient's daughter reported that she does not feel that she is able to transport patient back to [name]. Patient's RN will coordinate transportation when patient is ready for discharge.   CSW contacted by patient's RN, who reported that patient's daughter now wants to transport patient back to [name].   CSW provided update to [name] about patient's discharge and transportation, [name] reported that they are ready to accept patient back.  Celso Sickle, LCSWA [facility]  Clinical Social Worker [phone]

## 2017-08-28 ENCOUNTER — Emergency Department (HOSPITAL_COMMUNITY): Payer: Medicare Other

## 2017-08-28 ENCOUNTER — Inpatient Hospital Stay (HOSPITAL_COMMUNITY)
Admission: EM | Admit: 2017-08-28 | Discharge: 2017-09-05 | DRG: 698 | Disposition: A | Discharge status: Skilled Nursing Facility | Payer: Medicare Other | Attending: Family Medicine | Admitting: Family Medicine

## 2017-08-28 ENCOUNTER — Encounter (HOSPITAL_COMMUNITY): Payer: Self-pay | Admitting: Emergency Medicine

## 2017-08-28 DIAGNOSIS — E785 Hyperlipidemia, unspecified: Secondary | ICD-10-CM | POA: Diagnosis present

## 2017-08-28 DIAGNOSIS — R4182 Altered mental status, unspecified: Secondary | ICD-10-CM | POA: Diagnosis not present

## 2017-08-28 DIAGNOSIS — G301 Alzheimer's disease with late onset: Secondary | ICD-10-CM | POA: Diagnosis not present

## 2017-08-28 DIAGNOSIS — Z66 Do not resuscitate: Secondary | ICD-10-CM | POA: Diagnosis present

## 2017-08-28 DIAGNOSIS — Z888 Allergy status to other drugs, medicaments and biological substances status: Secondary | ICD-10-CM

## 2017-08-28 DIAGNOSIS — R9431 Abnormal electrocardiogram [ECG] [EKG]: Secondary | ICD-10-CM

## 2017-08-28 DIAGNOSIS — R1310 Dysphagia, unspecified: Secondary | ICD-10-CM | POA: Diagnosis present

## 2017-08-28 DIAGNOSIS — Z993 Dependence on wheelchair: Secondary | ICD-10-CM | POA: Diagnosis not present

## 2017-08-28 DIAGNOSIS — I700 Atherosclerosis of aorta: Secondary | ICD-10-CM | POA: Diagnosis present

## 2017-08-28 DIAGNOSIS — G2581 Restless legs syndrome: Secondary | ICD-10-CM | POA: Diagnosis present

## 2017-08-28 DIAGNOSIS — R627 Adult failure to thrive: Secondary | ICD-10-CM | POA: Diagnosis present

## 2017-08-28 DIAGNOSIS — B965 Pseudomonas (aeruginosa) (mallei) (pseudomallei) as the cause of diseases classified elsewhere: Secondary | ICD-10-CM | POA: Diagnosis present

## 2017-08-28 DIAGNOSIS — N400 Enlarged prostate without lower urinary tract symptoms: Secondary | ICD-10-CM | POA: Diagnosis present

## 2017-08-28 DIAGNOSIS — G309 Alzheimer's disease, unspecified: Secondary | ICD-10-CM | POA: Diagnosis present

## 2017-08-28 DIAGNOSIS — I4581 Long QT syndrome: Secondary | ICD-10-CM | POA: Diagnosis present

## 2017-08-28 DIAGNOSIS — Z951 Presence of aortocoronary bypass graft: Secondary | ICD-10-CM

## 2017-08-28 DIAGNOSIS — G4733 Obstructive sleep apnea (adult) (pediatric): Secondary | ICD-10-CM | POA: Diagnosis present

## 2017-08-28 DIAGNOSIS — Y846 Urinary catheterization as the cause of abnormal reaction of the patient, or of later complication, without mention of misadventure at the time of the procedure: Secondary | ICD-10-CM | POA: Diagnosis present

## 2017-08-28 DIAGNOSIS — F0281 Dementia in other diseases classified elsewhere with behavioral disturbance: Secondary | ICD-10-CM | POA: Diagnosis present

## 2017-08-28 DIAGNOSIS — K219 Gastro-esophageal reflux disease without esophagitis: Secondary | ICD-10-CM | POA: Diagnosis present

## 2017-08-28 DIAGNOSIS — R531 Weakness: Secondary | ICD-10-CM | POA: Diagnosis not present

## 2017-08-28 DIAGNOSIS — B952 Enterococcus as the cause of diseases classified elsewhere: Secondary | ICD-10-CM | POA: Diagnosis present

## 2017-08-28 DIAGNOSIS — G9341 Metabolic encephalopathy: Secondary | ICD-10-CM | POA: Diagnosis present

## 2017-08-28 DIAGNOSIS — T83511A Infection and inflammatory reaction due to indwelling urethral catheter, initial encounter: Secondary | ICD-10-CM | POA: Diagnosis not present

## 2017-08-28 DIAGNOSIS — I10 Essential (primary) hypertension: Secondary | ICD-10-CM | POA: Diagnosis present

## 2017-08-28 DIAGNOSIS — N390 Urinary tract infection, site not specified: Secondary | ICD-10-CM | POA: Diagnosis present

## 2017-08-28 DIAGNOSIS — Z7982 Long term (current) use of aspirin: Secondary | ICD-10-CM

## 2017-08-28 DIAGNOSIS — I2510 Atherosclerotic heart disease of native coronary artery without angina pectoris: Secondary | ICD-10-CM | POA: Diagnosis present

## 2017-08-28 DIAGNOSIS — Z515 Encounter for palliative care: Secondary | ICD-10-CM | POA: Diagnosis not present

## 2017-08-28 DIAGNOSIS — F0280 Dementia in other diseases classified elsewhere without behavioral disturbance: Secondary | ICD-10-CM | POA: Diagnosis present

## 2017-08-28 DIAGNOSIS — Z79899 Other long term (current) drug therapy: Secondary | ICD-10-CM

## 2017-08-28 DIAGNOSIS — R339 Retention of urine, unspecified: Secondary | ICD-10-CM | POA: Diagnosis present

## 2017-08-28 LAB — CBC WITH DIFFERENTIAL/PLATELET
BASOS ABS: 0 10*3/uL (ref 0.0–0.1)
Basophils Relative: 0 %
EOS ABS: 0.2 10*3/uL (ref 0.0–0.7)
EOS PCT: 2 %
HCT: 44.9 % (ref 39.0–52.0)
Hemoglobin: 14.4 g/dL (ref 13.0–17.0)
LYMPHS ABS: 1.3 10*3/uL (ref 0.7–4.0)
LYMPHS PCT: 17 %
MCH: 28.1 pg (ref 26.0–34.0)
MCHC: 32.1 g/dL (ref 30.0–36.0)
MCV: 87.5 fL (ref 78.0–100.0)
MONO ABS: 0.5 10*3/uL (ref 0.1–1.0)
Monocytes Relative: 7 %
Neutro Abs: 6 10*3/uL (ref 1.7–7.7)
Neutrophils Relative %: 74 %
PLATELETS: 320 10*3/uL (ref 150–400)
RBC: 5.13 MIL/uL (ref 4.22–5.81)
RDW: 16.3 % — AB (ref 11.5–15.5)
WBC: 8 10*3/uL (ref 4.0–10.5)

## 2017-08-28 LAB — URINALYSIS, ROUTINE W REFLEX MICROSCOPIC
BILIRUBIN URINE: NEGATIVE
GLUCOSE, UA: NEGATIVE mg/dL
KETONES UR: NEGATIVE mg/dL
NITRITE: POSITIVE — AB
PROTEIN: 100 mg/dL — AB
SQUAMOUS EPITHELIAL / LPF: NONE SEEN
Specific Gravity, Urine: 1.023 (ref 1.005–1.030)
pH: 5 (ref 5.0–8.0)

## 2017-08-28 LAB — I-STAT CHEM 8, ED
BUN: 19 mg/dL (ref 6–20)
CALCIUM ION: 1.1 mmol/L — AB (ref 1.15–1.40)
Chloride: 106 mmol/L (ref 101–111)
Creatinine, Ser: 1.1 mg/dL (ref 0.61–1.24)
GLUCOSE: 108 mg/dL — AB (ref 65–99)
HCT: 45 % (ref 39.0–52.0)
Hemoglobin: 15.3 g/dL (ref 13.0–17.0)
Potassium: 4.9 mmol/L (ref 3.5–5.1)
SODIUM: 143 mmol/L (ref 135–145)
TCO2: 30 mmol/L (ref 22–32)

## 2017-08-28 LAB — COMPREHENSIVE METABOLIC PANEL
ALT: 19 U/L (ref 17–63)
ANION GAP: 10 (ref 5–15)
AST: 19 U/L (ref 15–41)
Albumin: 3.2 g/dL — ABNORMAL LOW (ref 3.5–5.0)
Alkaline Phosphatase: 109 U/L (ref 38–126)
BUN: 16 mg/dL (ref 6–20)
CHLORIDE: 107 mmol/L (ref 101–111)
CO2: 27 mmol/L (ref 22–32)
CREATININE: 1.06 mg/dL (ref 0.61–1.24)
Calcium: 8.9 mg/dL (ref 8.9–10.3)
Glucose, Bld: 112 mg/dL — ABNORMAL HIGH (ref 65–99)
Potassium: 4.2 mmol/L (ref 3.5–5.1)
Sodium: 144 mmol/L (ref 135–145)
Total Bilirubin: 0.8 mg/dL (ref 0.3–1.2)
Total Protein: 7.7 g/dL (ref 6.5–8.1)

## 2017-08-28 LAB — I-STAT TROPONIN, ED: TROPONIN I, POC: 0 ng/mL (ref 0.00–0.08)

## 2017-08-28 LAB — I-STAT CG4 LACTIC ACID, ED: LACTIC ACID, VENOUS: 1.05 mmol/L (ref 0.5–1.9)

## 2017-08-28 LAB — INFLUENZA PANEL BY PCR (TYPE A & B)
INFLBPCR: NEGATIVE
Influenza A By PCR: NEGATIVE

## 2017-08-28 LAB — CBG MONITORING, ED: GLUCOSE-CAPILLARY: 104 mg/dL — AB (ref 65–99)

## 2017-08-28 LAB — LIPASE, BLOOD: Lipase: 44 U/L (ref 11–51)

## 2017-08-28 MED ORDER — ENOXAPARIN SODIUM 40 MG/0.4ML ~~LOC~~ SOLN
40.0000 mg | SUBCUTANEOUS | Status: DC
  Administered 2017-08-28 – 2017-09-04 (×8): 40 mg via SUBCUTANEOUS
  Filled 2017-08-28 (×9): qty 0.4

## 2017-08-28 MED ORDER — IOPAMIDOL (ISOVUE-370) INJECTION 76%: 100.0000 mL | Freq: Once | INTRAVENOUS | Status: DC | PRN

## 2017-08-28 MED ORDER — ACETAMINOPHEN 325 MG PO TABS: 650.0000 mg | ORAL_TABLET | Freq: Four times a day (QID) | ORAL | Status: DC | PRN

## 2017-08-28 MED ORDER — PIPERACILLIN-TAZOBACTAM 3.375 G IVPB 30 MIN
3.3750 g | Freq: Once | INTRAVENOUS | Status: AC
Start: 2017-08-28 — End: 2017-08-28
  Administered 2017-08-28: 3.375 g via INTRAVENOUS
  Filled 2017-08-28: qty 50

## 2017-08-28 MED ORDER — SODIUM CHLORIDE 0.9 % IV SOLN
1.0000 g | INTRAVENOUS | Status: DC
Start: 2017-08-28 — End: 2017-08-30
  Administered 2017-08-28 – 2017-08-29 (×2): 1 g via INTRAVENOUS
  Filled 2017-08-28: qty 10
  Filled 2017-08-28 (×2): qty 1

## 2017-08-28 MED ORDER — ONDANSETRON HCL 4 MG/2ML IJ SOLN
4.0000 mg | Freq: Four times a day (QID) | INTRAMUSCULAR | Status: DC | PRN
Start: 2017-08-28 — End: 2017-08-28

## 2017-08-28 MED ORDER — IOPAMIDOL (ISOVUE-300) INJECTION 61%
INTRAVENOUS | Status: AC
  Administered 2017-08-28: 100 mL via INTRAVENOUS
  Filled 2017-08-28: qty 100

## 2017-08-28 MED ORDER — SODIUM CHLORIDE 0.9 % IV SOLN: 2.0000 g | Freq: Once | INTRAVENOUS | Status: DC

## 2017-08-28 MED ORDER — SODIUM CHLORIDE 0.9 % IJ SOLN
INTRAMUSCULAR | Status: AC
  Filled 2017-08-28: qty 50

## 2017-08-28 MED ORDER — ACETAMINOPHEN 650 MG RE SUPP: 650.0000 mg | Freq: Four times a day (QID) | RECTAL | Status: DC | PRN

## 2017-08-28 MED ORDER — SODIUM CHLORIDE 0.9 % IV SOLN
Freq: Once | INTRAVENOUS | Status: AC
  Administered 2017-08-28: 19:00:00 via INTRAVENOUS

## 2017-08-28 MED ORDER — ALBUTEROL SULFATE (2.5 MG/3ML) 0.083% IN NEBU: 2.5000 mg | INHALATION_SOLUTION | Freq: Four times a day (QID) | RESPIRATORY_TRACT | Status: DC | PRN

## 2017-08-28 MED ORDER — ONDANSETRON HCL 4 MG PO TABS
4.0000 mg | ORAL_TABLET | Freq: Four times a day (QID) | ORAL | Status: DC | PRN
Start: 2017-08-28 — End: 2017-08-28

## 2017-08-28 MED ORDER — SODIUM CHLORIDE 0.9 % IV BOLUS (SEPSIS)
1000.0000 mL | Freq: Once | INTRAVENOUS | Status: AC
  Administered 2017-08-28: 1000 mL via INTRAVENOUS

## 2017-08-28 NOTE — ED Triage Notes (Signed)
Per [facility] pt has foley for UTI currently prescribed PO cipro however is not able to take pills. 20G in L hand given 200 CC NS en route.

## 2017-08-28 NOTE — H&P (Signed)
History and Physical    Travis Wallace ZOX:[id number] DOB: 12/06/1930 DOA: 08/28/2017  Referring MD/NP/PA:Alyssa Clelia CroftMurray, PA-C PCP: System, Pcp Not In  Patient coming from: [facility] via EMS  Chief Complaint: Altered Mental Status  I have personally briefly [facility] patient[facility]   HPI: Travis Wallace is a 84 y.o. male with medical history significant of severe Alzheimer dementia with behavioral disturbance, HTN, CAD, OSA; who presents after being more altered.  Patient is nonverbal at baseline and his daughter provides history.  He is wheelchair-bound and has some difficulty with fine motor skills, but previously was able to eat a regular diet.  He reportedly had been diagnosed with a urinary tract infection in the middle of February completed a course of ciprofloxacin.  No previous history of recurrent UTIs noted.  However, in the last week the patient has had a rapid decline.  The daughter normally comes to visit him 3-4 times a week, but left for vacation and returned 2 days ago.  Over that time at some point a urinary catheter had been placed because he was unable to void.  She reports that there appeared to be some right-sided facial drooping. In the last 24hours patient was more somnolent and was not noted to be safe to swallow pills.  Staff also noting increased right weakness.  Patient's dementia is severe and he has had aggressive behaviors previously she reports as he did not tolerate physical therapy prior.    ED Course: Upon admission into the emergency department patient was seen to be afebrile pulse 46-83, respirations 13-29, and all other vital signs maintained. Labs including CBC, CMP, and troponin were unremarkable.  CT scan of the brain was negative for any acute abnormalities.  Urinalysis was positive for many bacteria, nitrites, moderate leukocytes, RBCs, and TNTC WBCs.  Patient was given 1 dose of Zosyn empirically, 1 L normal saline IV  fluids, and blood/urine cultures obtained.  TRH called to admit.  Review of Systems  Unable to perform ROS: Patient nonverbal  Psychiatric/Behavioral: Positive for memory loss.    Past Medical History:  Diagnosis Date  . Acute gangrenous cholecystitis 12/19/2016   s/p lap cholecystectomy 12/19/16  . Acute kidney injury (HCC)   . Alzheimer's dementia   . Alzheimer's dementia   . CAD (coronary artery disease) 12/17/2016  . Coronary artery disease   . Dementia 12/17/2016  . Depression   . Dyslipidemia   . GERD (gastroesophageal reflux disease)   . Hyperbilirubinemia   . Hypertension   . Insomnia   . OSA (obstructive sleep apnea) 12/17/2016  . Pericholecystic abscess 12/17/2016   s/p OR drainage 12/19/16  . Restless leg   . Severe sepsis (HCC) 12/17/2016  . Sinus bradycardia   . Sleep apnea     Past Surgical History:  Procedure Laterality Date  . BACK SURGERY     x2  . CARDIAC CATHETERIZATION    . CATARACT EXTRACTION, BILATERAL    . CHOLECYSTECTOMY N/A 12/19/2016   Procedure: LAPAROSCOPIC CHOLECYSTECTOMY WITH INTRAOPERATIVE CHOLANGIOGRAM;  Surgeon: Ovidio KinNewman, David, MD;  Location: [facility] ORS;  Service: General;  Laterality: N/A;  . CORONARY ARTERY BYPASS GRAFT     X 3 VESSELS  . EYE SURGERY       reports that  has never smoked. he has never used smokeless tobacco. He reports that he does not drink alcohol or use drugs.  Allergies  Allergen Reactions  . Memantine Other (See Comments)    Reaction:  Bradycardia  Other reaction(s): Other (See Comments) Other reaction(s): Other (See Comments) Reaction:Bradycardia  Reaction:  Bradycardia      Family History  Problem Relation Age of Onset  . Pancreatic cancer Mother   . Hypertension Father   . CAD Father   . Stroke Father   . CAD Sister   . Diabetes Neg Hx     Prior to Admission medications   Medication Sig Start Date End Date Taking? Authorizing Provider  acetaminophen (TYLENOL) 325 MG tablet Take 2 tablets (650 mg  total) by mouth every 6 (six) hours as needed for mild pain, moderate pain or fever. 12/25/16  Yes Hongalgi, Maximino Greenland, MD  aspirin EC 81 MG tablet Take 81 mg by mouth daily.   Yes [provider]  Baclofen 5 MG TABS Take 5 mg by mouth 3 (three) times daily. 08/20/17  Yes [provider]  ciprofloxacin (CIPRO) 250 MG tablet Take 250 mg by mouth 2 (two) times daily. 08/27/17 08/30/17 Yes [provider]  divalproex (DEPAKOTE SPRINKLE) 125 MG capsule Take 125-250 mg by mouth See admin instructions. Take 125 mg once during the day for mood stabilization, and take 250 mg by mouth at bedtime for dementia 07/23/17  Yes [provider]  doxazosin (CARDURA) 4 MG tablet Take 4 mg by mouth daily.   Yes [provider]  lactulose (CHRONULAC) 10 GM/15ML solution Take 30 mLs by mouth daily as needed for mild constipation or moderate constipation.    Yes [provider]  omeprazole (PRILOSEC) 20 MG capsule Take 20 mg by mouth daily. 08/05/17  Yes [provider]  psyllium (HYDROCIL/METAMUCIL) 95 % PACK Take 1 packet by mouth daily. 12/26/16  Yes Hongalgi, Maximino Greenland, MD  rOPINIRole (REQUIP) 1 MG tablet Take 1 mg by mouth at bedtime.   Yes [provider]  QUEtiapine (SEROQUEL) 100 MG tablet Take 1 tablet (100 mg total) by mouth at bedtime. 02/15/17   Laveda Abbe, NP  QUEtiapine (SEROQUEL) 25 MG tablet Take 1 tablet (25 mg total) by mouth 2 (two) times daily. Patient taking differently: Take 50 mg by mouth at bedtime.  02/15/17   Laveda Abbe, NP    Physical Exam:  Constitutional: elderly male with no acute distress at this time but does not readily follow commands Vitals:   08/28/17 1808 08/28/17 1830 08/28/17 1850 08/28/17 1900  BP: (!) 165/69 (!) 162/65 (!) 162/65 (!) 174/74  Pulse: 69 64 64 67  Resp: (!) 21 18 16 17   Temp:      TempSrc:      SpO2: 95% 95% 94% 96%   Eyes: PERRL, lids and conjunctivae normal ENMT: Mucous  membranes are dry. Posterior pharynx clear of any exudate or lesions.  Neck: normal, supple, no masses, no thyromegaly Respiratory: clear to auscultation bilaterally, no wheezing, no crackles. Normal respiratory effort. No accessory muscle use.  Cardiovascular: Irregular, no murmurs / rubs / gallops. No extremity edema. 2+ pedal pulses. No carotid bruits.  Abdomen: no tenderness, no masses palpated. No hepatosplenomegaly. Bowel sounds positive.  Foley catheter in place with sediment present. Musculoskeletal: no clubbing / cyanosis. No joint deformity upper and lower extremities.  Decreased range of motion of the right lower extremity.  Lower extremity muscle atrophy noted. Skin: no rashes, lesions, ulcers. No induration Neurologic: CN 2-12 grossly intact. Sensation intact, DTR normal.  Patient able to move all extremities.  Right-sided facial droop.  Patient is nonverbal at baseline. Psychiatric: Difficult to assess as patient  has dementia and is nonverbal.    Labs on Admission: I have personally reviewed following labs and imaging studies  CBC: Recent Labs  Lab 08/28/17 1408 08/28/17 1417  WBC 8.0  --   NEUTROABS 6.0  --   HGB 14.4 15.3  HCT 44.9 45.0  MCV 87.5  --   PLT 320  --    Basic Metabolic Panel: Recent Labs  Lab 08/28/17 1408 08/28/17 1417  NA 144 143  K 4.2 4.9  CL 107 106  CO2 27  --   GLUCOSE 112* 108*  BUN 16 19  CREATININE 1.06 1.10  CALCIUM 8.9  --    GFR: CrCl cannot be calculated (Unknown ideal weight.). Liver Function Tests: Recent Labs  Lab 08/28/17 1408  AST 19  ALT 19  ALKPHOS 109  BILITOT 0.8  PROT 7.7  ALBUMIN 3.2*   Recent Labs  Lab 08/28/17 1408  LIPASE 44   No results for input(s): AMMONIA in the last 168 hours. Coagulation Profile: No results for input(s): INR, PROTIME in the last 168 hours. Cardiac Enzymes: No results for input(s): CKTOTAL, CKMB, CKMBINDEX, TROPONINI in the last 168 hours. BNP (last 3 results) No results for  input(s): PROBNP in the last 8760 hours. HbA1C: No results for input(s): HGBA1C in the last 72 hours. CBG: Recent Labs  Lab 08/28/17 1519  GLUCAP 104*   Lipid Profile: No results for input(s): CHOL, HDL, LDLCALC, TRIG, CHOLHDL, LDLDIRECT in the last 72 hours. Thyroid Function Tests: No results for input(s): TSH, T4TOTAL, FREET4, T3FREE, THYROIDAB in the last 72 hours. Anemia Panel: No results for input(s): VITAMINB12, FOLATE, FERRITIN, TIBC, IRON, RETICCTPCT in the last 72 hours. Urine analysis:    Component Value Date/Time   COLORURINE YELLOW 08/28/2017 1418   APPEARANCEUR TURBID (A) 08/28/2017 1418   LABSPEC 1.023 08/28/2017 1418   PHURINE 5.0 08/28/2017 1418   GLUCOSEU NEGATIVE 08/28/2017 1418   HGBUR LARGE (A) 08/28/2017 1418   BILIRUBINUR NEGATIVE 08/28/2017 1418   KETONESUR NEGATIVE 08/28/2017 1418   PROTEINUR 100 (A) 08/28/2017 1418   NITRITE POSITIVE (A) 08/28/2017 1418   LEUKOCYTESUR MODERATE (A) 08/28/2017 1418   Sepsis Labs: No results found for this or any previous visit (from the past 240 hour(s)).   Radiological Exams on Admission: Dg Chest 2 View  Result Date: 08/28/2017 CLINICAL DATA:  UTI EXAM: CHEST  2 VIEW COMPARISON:  02/24/2017 FINDINGS: Post sternotomy changes. Low lung volumes. Mild cardiomegaly. No focal pulmonary infiltrate or effusion. Aortic atherosclerosis. No pneumothorax. Surgical hardware in the lower spine. Surgical clips in the right upper quadrant. IMPRESSION: No active cardiopulmonary disease. Low lung volumes. Mild cardiomegaly. Electronically Signed   By: Jasmine Pang M.D.   On: 08/28/2017 15:06   Ct Head Wo Contrast  Result Date: 08/28/2017 CLINICAL DATA:  84 year old male with altered mental status. EXAM: CT HEAD WITHOUT CONTRAST TECHNIQUE: Contiguous axial images were obtained from the base of the skull through the vertex without intravenous contrast. COMPARISON:  Head CT 12/17/2016. FINDINGS: Brain: Stable cerebral volume since 2018. No  midline shift, ventriculomegaly, mass effect, evidence of mass lesion, intracranial hemorrhage or evidence of cortically based acute infarction. Patchy bilateral white matter hypodensity is stable. Chronic lacunar infarcts of the bilateral basal ganglia appear stable. No definite cortical encephalomalacia. Vascular: Calcified atherosclerosis at the skull base. No suspicious intracranial vascular hyperdensity. Skull: Stable and negative. Sinuses/Orbits: chronic sinus mucosal thickening and opacification, mildly progressed since 2018. Tympanic cavities and mastoids remain clear. Other: No acute orbit or  scalp soft tissue findings. IMPRESSION: 1. No acute intracranial abnormality. Moderately advanced chronic small vessel disease appears stable since 2018. 2. Chronic paranasal sinus disease has mildly progressed. Electronically Signed   By: Odessa Fleming M.D.   On: 08/28/2017 18:02   Ct Abdomen Pelvis W Contrast  Result Date: 08/28/2017 CLINICAL DATA:  foley for UTI currently prescribed PO cipro however is not able to take pills." EXAM: CT ABDOMEN AND PELVIS WITH CONTRAST TECHNIQUE: Multidetector CT imaging of the abdomen and pelvis was performed using the standard protocol following bolus administration of intravenous contrast. CONTRAST:  ISOVUE-300 IOPAMIDOL (ISOVUE-300) INJECTION 61% COMPARISON:  12/17/2016 FINDINGS: Lower chest: Coronary calcifications. No pleural or pericardial effusion. Previous median sternotomy. Visualized lung bases clear. Hepatobiliary: Stable benign-appearing hepatic cysts in segments 2 and 8. no new liver lesion. No intrahepatic biliary ductal dilatation. Cholecystectomy clips. Prominent CBD to the ampullary as before. Pancreas: Unremarkable. No pancreatic ductal dilatation or surrounding inflammatory changes. Spleen: Normal in size without focal abnormality. Adrenals/Urinary Tract: Adrenal glands and kidneys unremarkable. No hydronephrosis. Foley catheter decompresses urinary bladder.  Stomach/Bowel: Stomach is nondistended. Small bowel decompressed. Staple line near the base of the cecum. Appendix not identified. Moderate colonic fecal material without dilatation. Scattered sigmoid diverticula without adjacent inflammatory/edematous change or abscess. Fecal distention of the rectum. Vascular/Lymphatic: Moderate aortoiliac atheromatous calcifications without aneurysm. Portal vein patent. Bilateral pelvic phleboliths. No abdominal or pelvic adenopathy localized. Reproductive: Mild prostatic enlargement with central coarse calcifications. Other: No ascites.  No free air. Musculoskeletal: Posterior surgical fixation hardware T11-L1, intact without surrounding lucency. Degenerative disc disease L5-S1. Previous median sternotomy. Right hip arthroplasty components without fracture or dislocation. No worrisome bone lesion. IMPRESSION: 1. No acute findings. 2. Sigmoid diverticula. 3. Coronary and aortoiliac   atherosclerosis (ICD10-170.0) 4. Postop changes as above. Electronically Signed   By: Corlis Leak M.D.   On: 08/28/2017 15:06    EKG: Independently reviewed.  Appears to be sinus arrhythmia. QTc noted to be 503.  Assessment/Plan Complicated urinary tract infection: Acute.  Patient presents with positive urinalysis showing signs of infection with recently placed Foley catheter.  Previously noted to have urinary tract infection earlier in the month treated with ciprofloxacin.  Initially started on Zosyn.  Patient with no previous cultures to compare or reports of recurrent urinary tract infections. - Admit to a MedSurg bed - Follow-up urine and blood cultures - Rocephin per pharmacy - Will need trial to discontinue Foley catheter  and bladder train when able  Acute encephalopathy: Patient more altered question possibility of - Neurochecks q4hr x3  Right sided weakness, dysphagia: Patient reportedly went downhill since daughter left 1 week ago, when she returned 2 days ago noticed some  right-sided facial weakness.  CT scan of the brain showed no acute abnormalities. - N.p.o - Check MRI of the brain w/o contrast - PT/OT/speech therapy consult  Alzheimer's dementia with behavioral disturbance  Prolonged QT interval: QTC 503 - Recheck EKG -Follow-up telemetry overnight  DVT prophylaxis: lovenox  Code Status: DO NOT RESUSCITATE Family Communication: Discussed plan of care with the patient and family present at bedside Disposition Plan: likely dscharge   Consults called: none  Admission status: Inpatient   Clydie Braun MD [facility] Pager [phone]   If 7PM-7AM, please contact night-coverage [email or web address] Password [facility]  08/28/2017, 7:29 PM

## 2017-08-28 NOTE — ED Notes (Signed)
Bladder scan showed 45 mL. RN aware.

## 2017-08-28 NOTE — Progress Notes (Signed)
PHARMACY NOTE -  Rocephin  Pharmacy has been consulted for dosing of Rocephin for UTI. Rocephin 1gm IV q24h has been ordered.  Need for further dosage adjustment appears unlikely at present.    Will sign off at this time.  Please reconsult if a change in clinical status warrants re-evaluation of dosage.  Junita PushMichelle Autumn Pruitt, PharmD, BCPS 08/28/2017@8 :07 PM

## 2017-08-28 NOTE — ED Notes (Signed)
Pt going to MRI before going upstairs.

## 2017-08-28 NOTE — ED Notes (Signed)
Patient transported to CT 

## 2017-08-28 NOTE — Progress Notes (Signed)
A consult was received from an ED physician for Zosyn per pharmacy dosing.  The patient's profile has been reviewed for ht/wt/allergies/indication/available labs.   A one time order has been placed for Zosyn 3.375gm IV.  Further antibiotics/pharmacy consults should be ordered by admitting physician if indicated.                       Thank you, Elson ClanLilliston, Ekta Dancer Michelle 08/28/2017  3:42 PM

## 2017-08-28 NOTE — ED Notes (Signed)
ED TO INPATIENT HANDOFF REPORT  Name/Age/Gender Travis Wallace 84 y.o. male  Code Status    Code Status Orders  (From admission, onward)        Start     Ordered   08/28/17 1945  Full code  Continuous     08/28/17 1953    Code Status History    Date Active Date Inactive Code Status Order ID Comments User Context   12/17/2016 23:18 12/25/2016 18:31 Full Code [id number]  Toy Baker, MD Inpatient      Home/[name]/Other Nursing Home  Chief Complaint AMS  Level of Care/Admitting Diagnosis ED Disposition    ED Disposition Condition [place] [facility]: [facility] [[id number]]  Level of Care: Telemetry [5]  Admit to tele based on following criteria: Complex arrhythmia (Bradycardia/Tachycardia)  Diagnosis: UTI (urinary tract infection) [825053]  Admitting Physician: Norval Morton [[id number]]  Attending Physician: Norval Morton [[id number]]  Estimated length of stay: past midnight tomorrow  Certification:: I certify this patient will need inpatient services for at least 2 midnights  PT Class (Do Not Modify): Inpatient [101]  PT Acc Code (Do Not Modify): Private [1]       Medical History Past Medical History:  Diagnosis Date  . Acute gangrenous cholecystitis 12/19/2016   s/p lap cholecystectomy 12/19/16  . Acute kidney injury ([place])   . Alzheimer's dementia   . Alzheimer's dementia   . CAD (coronary artery disease) 12/17/2016  . Coronary artery disease   . Dementia 12/17/2016  . Depression   . Dyslipidemia   . GERD (gastroesophageal reflux disease)   . Hyperbilirubinemia   . Hypertension   . Insomnia   . OSA (obstructive sleep apnea) 12/17/2016  . Pericholecystic abscess 12/17/2016   s/p OR drainage 12/19/16  . Restless leg   . Severe sepsis ([place]) 12/17/2016  . Sinus bradycardia   . Sleep apnea     Allergies Allergies  Allergen Reactions  . Memantine Other (See Comments)    Reaction:  Bradycardia  Other reaction(s): Other (See  Comments) Other reaction(s): Other (See Comments) Reaction:Bradycardia  Reaction:  Bradycardia      IV Location/Drains/Wounds Patient Lines/Drains/Airways Status   Active Line/Drains/Airways    Name:   Placement date:   Placement time:   Site:   Days:   Peripheral IV 08/28/17 Left Hand   08/28/17    1240    Hand   less than 1   Peripheral IV 08/28/17 Right Forearm   08/28/17    1419    Forearm   less than 1   Closed System Drain 1 Abdomen Bulb (JP) 19 Fr.   12/19/16    1342    Abdomen   252   Urethral Catheter Hideko Esselman, RN Double-lumen 18 Fr.   08/28/17    1810    Double-lumen   less than 1   Incision (Closed) 12/19/16 Abdomen   12/19/16    1233     252   Incision - 4 Ports Abdomen Umbilicus Right;Mid Right;Lower Right;Upper   12/19/16    1250     252          Labs/Imaging Results for orders placed or performed during the hospital encounter of 08/28/17 (from the past 48 hour(s))  Comprehensive metabolic panel     Status: Abnormal   Collection Time: 08/28/17  2:08 PM  Result Value Ref Range   Sodium 144 135 - 145 mmol/L   Potassium 4.2 3.5 - 5.1 mmol/L  Chloride 107 101 - 111 mmol/L   CO2 27 22 - 32 mmol/L   Glucose, Bld 112 (H) 65 - 99 mg/dL   BUN 16 6 - 20 mg/dL   Creatinine, Ser 1.06 0.61 - 1.24 mg/dL   Calcium 8.9 8.9 - 10.3 mg/dL   Total Protein 7.7 6.5 - 8.1 g/dL   Albumin 3.2 (L) 3.5 - 5.0 g/dL   AST 19 15 - 41 U/L   ALT 19 17 - 63 U/L   Alkaline Phosphatase 109 38 - 126 U/L   Total Bilirubin 0.8 0.3 - 1.2 mg/dL   GFR calc non Af Amer >60 >60 mL/min   GFR calc Af Amer >60 >60 mL/min    Comment: (NOTE) The eGFR has been calculated using the CKD EPI equation. This calculation has not been validated in all clinical situations. eGFR's persistently <60 mL/min signify possible Chronic Kidney Disease.    Anion gap 10 5 - 15    Comment: Performed at [facility], [place] [address], [place], [place] [address]  CBC WITH DIFFERENTIAL     Status: Abnormal    Collection Time: 08/28/17  2:08 PM  Result Value Ref Range   WBC 8.0 4.0 - 10.5 K/uL   RBC 5.13 4.22 - 5.81 MIL/uL   Hemoglobin 14.4 13.0 - 17.0 g/dL   HCT 44.9 39.0 - 52.0 %   MCV 87.5 78.0 - 100.0 fL   MCH 28.1 26.0 - 34.0 pg   MCHC 32.1 30.0 - 36.0 g/dL   RDW 16.3 (H) 11.5 - 15.5 %   Platelets 320 150 - 400 K/uL   Neutrophils Relative % 74 %   Neutro Abs 6.0 1.7 - 7.7 K/uL   Lymphocytes Relative 17 %   Lymphs Abs 1.3 0.7 - 4.0 K/uL   Monocytes Relative 7 %   Monocytes Absolute 0.5 0.1 - 1.0 K/uL   Eosinophils Relative 2 %   Eosinophils Absolute 0.2 0.0 - 0.7 K/uL   Basophils Relative 0 %   Basophils Absolute 0.0 0.0 - 0.1 K/uL    Comment: Performed at [facility], [place] [address], [place], [place] [address]  Lipase, blood     Status: None   Collection Time: 08/28/17  2:08 PM  Result Value Ref Range   Lipase 44 11 - 51 U/L    Comment: Performed at [facility], [place] [address], [place], [place] [address]  Influenza panel by PCR (type A & B)     Status: None   Collection Time: 08/28/17  2:15 PM  Result Value Ref Range   Influenza A By PCR NEGATIVE NEGATIVE   Influenza B By PCR NEGATIVE NEGATIVE    Comment: (NOTE) The Xpert Xpress Flu assay is intended as an aid in the diagnosis of  influenza and should not be used as a sole basis for treatment.  This  assay is FDA approved for nasopharyngeal swab specimens only. Nasal  washings and aspirates are unacceptable for Xpert Xpress Flu testing. Performed at [facility], [place] [address], [place], [place] [address]   I-Stat Chem 8, ED     Status: Abnormal   Collection Time: 08/28/17  2:17 PM  Result Value Ref Range   Sodium 143 135 - 145 mmol/L   Potassium 4.9 3.5 - 5.1 mmol/L   Chloride 106 101 - 111 mmol/L   BUN 19 6 - 20 mg/dL   Creatinine, Ser 1.10 0.61 - 1.24 mg/dL   Glucose, Bld  108 (H) 65 - 99 mg/dL   Calcium, Ion 1.10 (L) 1.15 - 1.40 mmol/L   TCO2 30 22 - 32  mmol/L   Hemoglobin 15.3 13.0 - 17.0 g/dL   HCT 45.0 39.0 - 52.0 %  I-Stat CG4 Lactic Acid, ED     Status: None   Collection Time: 08/28/17  2:17 PM  Result Value Ref Range   Lactic Acid, Venous 1.05 0.5 - 1.9 mmol/L  I-Stat Troponin, ED (not at [facility])     Status: None   Collection Time: 08/28/17  2:18 PM  Result Value Ref Range   Troponin i, poc 0.00 0.00 - 0.08 ng/mL   Comment 3            Comment: Due to the release kinetics of cTnI, a negative result within the first hours of the onset of symptoms does not rule out myocardial infarction with certainty. If myocardial infarction is still suspected, repeat the test at appropriate intervals.   Urinalysis, Routine w reflex microscopic     Status: Abnormal   Collection Time: 08/28/17  2:18 PM  Result Value Ref Range   Color, Urine YELLOW YELLOW   APPearance TURBID (A) CLEAR   Specific Gravity, Urine 1.023 1.005 - 1.030   pH 5.0 5.0 - 8.0   Glucose, UA NEGATIVE NEGATIVE mg/dL   Hgb urine dipstick LARGE (A) NEGATIVE   Bilirubin Urine NEGATIVE NEGATIVE   Ketones, ur NEGATIVE NEGATIVE mg/dL   Protein, ur 100 (A) NEGATIVE mg/dL   Nitrite POSITIVE (A) NEGATIVE   Leukocytes, UA MODERATE (A) NEGATIVE   RBC / HPF TOO NUMEROUS TO COUNT 0 - 5 RBC/hpf   WBC, UA TOO NUMEROUS TO COUNT 0 - 5 WBC/hpf   Bacteria, UA MANY (A) NONE SEEN   Squamous Epithelial / LPF NONE SEEN NONE SEEN   WBC Clumps PRESENT    Mucus PRESENT     Comment: Performed at [facility], [place] [address], [place], [place] [address]  CBG monitoring, ED     Status: Abnormal   Collection Time: 08/28/17  3:19 PM  Result Value Ref Range   Glucose-Capillary 104 (H) 65 - 99 mg/dL   Dg Chest 2 View  Result Date: 08/28/2017 CLINICAL DATA:  UTI EXAM: CHEST  2 VIEW COMPARISON:  02/24/2017 FINDINGS: Post sternotomy changes. Low lung volumes. Mild cardiomegaly. No focal pulmonary infiltrate or effusion. Aortic atherosclerosis. No pneumothorax. Surgical hardware in  the lower spine. Surgical clips in the right upper quadrant. IMPRESSION: No active cardiopulmonary disease. Low lung volumes. Mild cardiomegaly. Electronically Signed   By: Donavan Foil M.D.   On: 08/28/2017 15:06   Ct Head Wo Contrast  Result Date: 08/28/2017 CLINICAL DATA:  84 year old male with altered mental status. EXAM: CT HEAD WITHOUT CONTRAST TECHNIQUE: Contiguous axial images were obtained from the base of the skull through the vertex without intravenous contrast. COMPARISON:  Head CT 12/17/2016. FINDINGS: Brain: Stable cerebral volume since 2018. No midline shift, ventriculomegaly, mass effect, evidence of mass lesion, intracranial hemorrhage or evidence of cortically based acute infarction. Patchy bilateral white matter hypodensity is stable. Chronic lacunar infarcts of the bilateral basal ganglia appear stable. No definite cortical encephalomalacia. Vascular: Calcified atherosclerosis at the skull base. No suspicious intracranial vascular hyperdensity. Skull: Stable and negative. Sinuses/Orbits: chronic sinus mucosal thickening and opacification, mildly progressed since 2018. Tympanic cavities and mastoids remain clear. Other: No acute orbit or scalp soft tissue findings. IMPRESSION: 1. No acute intracranial abnormality. Moderately advanced chronic small vessel disease  appears stable since 2018. 2. Chronic paranasal sinus disease has mildly progressed. Electronically Signed   By: Genevie Ann M.D.   On: 08/28/2017 18:02   Ct Abdomen Pelvis W Contrast  Result Date: 08/28/2017 CLINICAL DATA:  foley for UTI currently prescribed PO cipro however is not able to take pills." EXAM: CT ABDOMEN AND PELVIS WITH CONTRAST TECHNIQUE: Multidetector CT imaging of the abdomen and pelvis was performed using the standard protocol following bolus administration of intravenous contrast. CONTRAST:  ISOVUE-300 IOPAMIDOL (ISOVUE-300) INJECTION 61% COMPARISON:  12/17/2016 FINDINGS: Lower chest: Coronary calcifications. No  pleural or pericardial effusion. Previous median sternotomy. Visualized lung bases clear. Hepatobiliary: Stable benign-appearing hepatic cysts in segments 2 and 8. no new liver lesion. No intrahepatic biliary ductal dilatation. Cholecystectomy clips. Prominent CBD to the ampullary as before. Pancreas: Unremarkable. No pancreatic ductal dilatation or surrounding inflammatory changes. Spleen: Normal in size without focal abnormality. Adrenals/Urinary Tract: Adrenal glands and kidneys unremarkable. No hydronephrosis. Foley catheter decompresses urinary bladder. Stomach/Bowel: Stomach is nondistended. Small bowel decompressed. Staple line near the base of the cecum. Appendix not identified. Moderate colonic fecal material without dilatation. Scattered sigmoid diverticula without adjacent inflammatory/edematous change or abscess. Fecal distention of the rectum. Vascular/Lymphatic: Moderate aortoiliac atheromatous calcifications without aneurysm. Portal vein patent. Bilateral pelvic phleboliths. No abdominal or pelvic adenopathy localized. Reproductive: Mild prostatic enlargement with central coarse calcifications. Other: No ascites.  No free air. Musculoskeletal: Posterior surgical fixation hardware T11-L1, intact without surrounding lucency. Degenerative disc disease L5-S1. Previous median sternotomy. Right hip arthroplasty components without fracture or dislocation. No worrisome bone lesion. IMPRESSION: 1. No acute findings. 2. Sigmoid diverticula. 3. Coronary and aortoiliac   atherosclerosis (ICD10-170.0) 4. Postop changes as above. Electronically Signed   By: Lucrezia Europe M.D.   On: 08/28/2017 15:06    Pending Labs Unresulted Labs (From admission, onward)   Start     Ordered   08/29/17 0500  CBC  Tomorrow morning,   R     08/28/17 1953   08/29/17 1610  Basic metabolic panel  Tomorrow morning,   R     08/28/17 1953   08/28/17 1421  Blood culture (routine x 2)  BLOOD CULTURE X 2,   STAT     08/28/17 1420    08/28/17 1348  Urine culture  STAT,   STAT     08/28/17 1349      Vitals/Pain Today's Vitals   08/28/17 1900 08/28/17 1930 08/28/17 1950 08/28/17 2001  BP: (!) 174/74 102/73 102/73 (!) 151/61  Pulse: 67 65 62 (!) 31  Resp: _0 Temp:      TempSrc:      SpO2: 96% 96% 95% 97%    Isolation Precautions Droplet precaution  Medications Medications  iopamidol (ISOVUE-370) 76 % injection 100 mL (not administered)  enoxaparin (LOVENOX) injection 40 mg (not administered)  acetaminophen (TYLENOL) tablet 650 mg (not administered)    Or  acetaminophen (TYLENOL) suppository 650 mg (not administered)  ondansetron (ZOFRAN) tablet 4 mg (not administered)    Or  ondansetron (ZOFRAN) injection 4 mg (not administered)  albuterol (PROVENTIL) (2.5 MG/3ML) 0.083% nebulizer solution 2.5 mg (not administered)  cefTRIAXone (ROCEPHIN) 1 g in sodium chloride 0.9 % 100 mL IVPB (not administered)  iopamidol (ISOVUE-300) 61 % injection (100 mLs Intravenous Contrast Given 08/28/17 1441)  sodium chloride 0.9 % bolus 1,000 mL (0 mLs Intravenous Stopped 08/28/17 1814)  piperacillin-tazobactam (ZOSYN) IVPB 3.375 g (0 g Intravenous Stopped 08/28/17 1624)  0.9 %  sodium  chloride infusion ( Intravenous New Bag/Given 08/28/17 1919)    Mobility non-ambulatory

## 2017-08-28 NOTE — ED Provider Notes (Signed)
[facility]-EMERGENCY DEPT Provider Note   CSN: [id number] Arrival date & time: 08/28/17  1233     History   Chief Complaint Chief Complaint  Patient presents with  . Altered Mental Status    HPI Travis Wallace is a 84 y.o. male.  HPI  Patient is an 84 year old male with a history of dementia, CAD, GERD, closed fracture of right femur, BPH, essential hypertension, and currently treated for urinary tract infection at his nursing care facility, came to place, presenting for altered mental status.  Patient nonverbal.  Level 5 caveat altered mental status.  Collateral information obtained from nurse, Ms. Constance Haw, at [facility].  Per Ms. McCray, patient is usually alert but does not produce coherent speech.  He is occasionally combative.  Patient exhibited decline in his mental status over the past 3 days.  Patient was diagnosed with urinary tract infection at the facility, and started on p.o. Cipro.  Patient has not received any of this, as he is unable to tolerate p.o.  Per Ms. McCray, patient does exhibit contracture of the right leg due to his prior hip fracture there.  Additionally, she notes that patient has been weak on the right side at baseline.  No known history of CVA. Patient has had a Foley catheter in place since February 13 for urinary retention.  Past Medical History:  Diagnosis Date  . Acute gangrenous cholecystitis 12/19/2016   s/p lap cholecystectomy 12/19/16  . Acute kidney injury (HCC)   . Alzheimer's dementia   . Alzheimer's dementia   . CAD (coronary artery disease) 12/17/2016  . Coronary artery disease   . Dementia 12/17/2016  . Depression   . Dyslipidemia   . GERD (gastroesophageal reflux disease)   . Hyperbilirubinemia   . Hypertension   . Insomnia   . OSA (obstructive sleep apnea) 12/17/2016  . Pericholecystic abscess 12/17/2016   s/p OR drainage 12/19/16  . Restless leg   . Severe sepsis (HCC) 12/17/2016  . Sinus bradycardia   . Sleep  apnea     Patient Active Problem List   Diagnosis Date Noted  . Lactic acidosis 12/27/2016  . HLD (hyperlipidemia) 12/27/2016  . Pericholecystic abscess s/p OR drainage 12/19/2016 12/21/2016  . Delirium 12/21/2016  . Alzheimer's dementia   . Hypertension   . GERD (gastroesophageal reflux disease)   . Sleep apnea   . Restless leg   . Insomnia   . Sinus bradycardia 12/19/2016  . Hyperbilirubinemia 12/19/2016  . AKI (acute kidney injury) (HCC) 12/19/2016  . Altered mental status   . Cholecystitis   . Acute gangrenous cholecystitis s/p lap cholecystectomy 12/19/2016 12/17/2016  . Cholangitis 12/17/2016  . Severe sepsis (HCC) 12/17/2016  . CAD (coronary artery disease) 12/17/2016  . HTN (hypertension) 12/17/2016  . OSA (obstructive sleep apnea) 12/17/2016  . Sepsis (HCC) 12/17/2016  . Dementia with aggressive behavior 12/17/2016  . Pericholecystic abscess 12/17/2016    Past Surgical History:  Procedure Laterality Date  . BACK SURGERY     x2  . CARDIAC CATHETERIZATION    . CATARACT EXTRACTION, BILATERAL    . CHOLECYSTECTOMY N/A 12/19/2016   Procedure: LAPAROSCOPIC CHOLECYSTECTOMY WITH INTRAOPERATIVE CHOLANGIOGRAM;  Surgeon: Ovidio Kin, MD;  Location: [facility];  Service: General;  Laterality: N/A;  . CORONARY ARTERY BYPASS GRAFT     X 3 VESSELS  . EYE SURGERY         Home Medications    Prior to Admission medications   Medication Sig Start Date End Date  Taking? Authorizing Provider  acetaminophen (TYLENOL) 325 MG tablet Take 2 tablets (650 mg total) by mouth every 6 (six) hours as needed for mild pain, moderate pain or fever. 12/25/16   Hongalgi, Maximino Greenland, MD  amLODipine-benazepril (LOTREL) 5-20 MG capsule Take 1 capsule by mouth daily.    [provider]  aspirin EC 81 MG tablet Take 81 mg by mouth daily.    [provider]  atorvastatin (LIPITOR) 20 MG tablet Take 20 mg by mouth at bedtime.    [provider]  cetirizine (ZYRTEC) 10 MG tablet  Take 10 mg by mouth daily.    [provider]  doxazosin (CARDURA) 4 MG tablet Take 4 mg by mouth daily.    [provider]  lactulose (CHRONULAC) 10 GM/15ML solution Take 30 mLs by mouth daily as needed for mild constipation or moderate constipation.     [provider]  lansoprazole (PREVACID) 30 MG capsule Take 30 mg by mouth daily.    [provider]  magnesium gluconate (MAGONATE) 500 MG tablet Take 500 mg by mouth daily.    [provider]  niacin 500 MG tablet Take 500 mg by mouth daily.    [provider]  psyllium (HYDROCIL/METAMUCIL) 95 % PACK Take 1 packet by mouth daily. 12/26/16   Hongalgi, Maximino Greenland, MD  QUEtiapine (SEROQUEL) 100 MG tablet Take 1 tablet (100 mg total) by mouth at bedtime. 02/15/17   Laveda Abbe, NP  QUEtiapine (SEROQUEL) 25 MG tablet Take 25-75 mg by mouth See admin instructions. Pt takes one tablet twice daily and three tablets at bedtime.   Pt is also able to take one tablet daily as needed for restlessness.    [provider]  QUEtiapine (SEROQUEL) 25 MG tablet Take 1 tablet (25 mg total) by mouth 2 (two) times daily. 02/15/17   Laveda Abbe, NP  rOPINIRole (REQUIP) 1 MG tablet Take 1 mg by mouth at bedtime.    [provider]    Family History Family History  Problem Relation Age of Onset  . Pancreatic cancer Mother   . Hypertension Father   . CAD Father   . Stroke Father   . CAD Sister   . Diabetes Neg Hx     Social History Social History   Tobacco Use  . Smoking status: Never Smoker  . Smokeless tobacco: Never Used  Substance Use Topics  . Alcohol use: No  . Drug use: No     Allergies   Memantine   Review of Systems Review of Systems  Neurological: Positive for weakness.  Psychiatric/Behavioral: Positive for confusion.   Level 5 caveat altered mental status.  Physical Exam Updated Vital Signs BP (!) 155/67   Pulse 71 Comment: Simultaneous  filing. User may not have seen previous data.  Temp (!) 97.5 F (36.4 C) (Oral)   Resp 18 Comment: Simultaneous filing. User may not have seen previous data.  SpO2 96% Comment: Simultaneous filing. User may not have seen previous data.  Physical Exam  Constitutional: He appears well-developed and well-nourished.  Patient somnolent and difficult to arouse.  HENT:  Head: Normocephalic and atraumatic.  Mouth/Throat: Oropharynx is clear and moist.  Eyes:  Crust present on bilateral eyes.  Pupils minimally reactive to light.  Neck: Normal range of motion. Neck supple.  Cardiovascular: Normal rate, regular rhythm, S1 normal and S2 normal.  No murmur heard. Pulmonary/Chest: Effort normal.  Normal respiratory effort.  Lung sounds diminished in all fields.  Abdominal: Soft. He exhibits no distension. There is tenderness. There is no guarding.  Epigastric discomfort to palpation.  Musculoskeletal: Normal range of motion. He exhibits edema. He exhibits no deformity.  Right lower externally pedal edema.  The right lower extremity is contractured and is difficult to extend.  Lymphadenopathy:    He has no cervical adenopathy.  Neurological: GCS eye subscore is 2. GCS verbal subscore is 2. GCS motor subscore is 4.  Skin: Skin is warm and dry. No rash noted. No erythema.  Psychiatric: He has a normal mood and affect. His behavior is normal. Judgment and thought content normal.  Nursing note and vitals reviewed.    ED Treatments / Results  Labs (all labs ordered are listed, but only abnormal results are displayed) Labs Reviewed  URINE CULTURE  COMPREHENSIVE METABOLIC PANEL  CBC  CBC WITH DIFFERENTIAL/PLATELET  INFLUENZA PANEL BY PCR (TYPE A & B)  LIPASE, BLOOD  CBG MONITORING, ED  I-STAT CHEM 8, ED  I-STAT CG4 LACTIC ACID, ED  I-STAT TROPONIN, ED    EKG  EKG Interpretation  Date/Time:  Friday August 28 2017 12:56:57 EST Ventricular Rate:  68 PR Interval:    QRS Duration: 85 QT  Interval:  472 QTC Calculation: 502 R Axis:   -8 Text Interpretation:  Likely sinus but difficult to assess due to artifact Abnormal R-wave progression, early transition Borderline abnrm T, anterolateral leads Prolonged QT interval Baseline wander in lead(s) V2 V4 No significant change since last tracing Confirmed by Shaune PollackIsaacs, Cameron [phone]([id number]) on 08/28/2017 7:40:16 PM       Radiology Dg Chest 2 View  Result Date: 08/28/2017 CLINICAL DATA:  UTI EXAM: CHEST  2 VIEW COMPARISON:  02/24/2017 FINDINGS: Post sternotomy changes. Low lung volumes. Mild cardiomegaly. No focal pulmonary infiltrate or effusion. Aortic atherosclerosis. No pneumothorax. Surgical hardware in the lower spine. Surgical clips in the right upper quadrant. IMPRESSION: No active cardiopulmonary disease. Low lung volumes. Mild cardiomegaly. Electronically Signed   By: Jasmine PangKim  Fujinaga M.D.   On: 08/28/2017 15:06   Ct Head Wo Contrast  Result Date: 08/28/2017 CLINICAL DATA:  84 year old male with altered mental status. EXAM: CT HEAD WITHOUT CONTRAST TECHNIQUE: Contiguous axial images were obtained from the base of the skull through the vertex without intravenous contrast. COMPARISON:  Head CT 12/17/2016. FINDINGS: Brain: Stable cerebral volume since 2018. No midline shift, ventriculomegaly, mass effect, evidence of mass lesion, intracranial hemorrhage or evidence of cortically based acute infarction. Patchy bilateral white matter hypodensity is stable. Chronic lacunar infarcts of the bilateral basal ganglia appear stable. No definite cortical encephalomalacia. Vascular: Calcified atherosclerosis at the skull base. No suspicious intracranial vascular hyperdensity. Skull: Stable and negative. Sinuses/Orbits: chronic sinus mucosal thickening and opacification, mildly progressed since 2018. Tympanic cavities and mastoids remain clear. Other: No acute orbit or scalp soft tissue findings. IMPRESSION: 1. No acute intracranial abnormality. Moderately  advanced chronic small vessel disease appears stable since 2018. 2. Chronic paranasal sinus disease has mildly progressed. Electronically Signed   By: Odessa FlemingH  Hall M.D.   On: 08/28/2017 18:02   Ct Abdomen Pelvis W Contrast  Result Date: 08/28/2017 CLINICAL DATA:  foley for UTI currently prescribed PO cipro however is not able to take pills." EXAM: CT ABDOMEN AND PELVIS WITH CONTRAST TECHNIQUE: Multidetector CT imaging of the abdomen and pelvis was performed using the standard protocol following bolus administration of intravenous contrast. CONTRAST:  ISOVUE-300 IOPAMIDOL (ISOVUE-300) INJECTION 61% COMPARISON:  12/17/2016 FINDINGS: Lower chest: Coronary calcifications. No pleural or pericardial effusion. Previous  median sternotomy. Visualized lung bases clear. Hepatobiliary: Stable benign-appearing hepatic cysts in segments 2 and 8. no new liver lesion. No intrahepatic biliary ductal dilatation. Cholecystectomy clips. Prominent CBD to the ampullary as before. Pancreas: Unremarkable. No pancreatic ductal dilatation or surrounding inflammatory changes. Spleen: Normal in size without focal abnormality. Adrenals/Urinary Tract: Adrenal glands and kidneys unremarkable. No hydronephrosis. Foley catheter decompresses urinary bladder. Stomach/Bowel: Stomach is nondistended. Small bowel decompressed. Staple line near the base of the cecum. Appendix not identified. Moderate colonic fecal material without dilatation. Scattered sigmoid diverticula without adjacent inflammatory/edematous change or abscess. Fecal distention of the rectum. Vascular/Lymphatic: Moderate aortoiliac atheromatous calcifications without aneurysm. Portal vein patent. Bilateral pelvic phleboliths. No abdominal or pelvic adenopathy localized. Reproductive: Mild prostatic enlargement with central coarse calcifications. Other: No ascites.  No free air. Musculoskeletal: Posterior surgical fixation hardware T11-L1, intact without surrounding lucency. Degenerative  disc disease L5-S1. Previous median sternotomy. Right hip arthroplasty components without fracture or dislocation. No worrisome bone lesion. IMPRESSION: 1. No acute findings. 2. Sigmoid diverticula. 3. Coronary and aortoiliac   atherosclerosis (ICD10-170.0) 4. Postop changes as above. Electronically Signed   By: Corlis Leak M.D.   On: 08/28/2017 15:06    Procedures Procedures (including critical care time)  Medications Ordered in ED Medications - No data to display   Initial Impression / Assessment and Plan / ED Course  I have reviewed the triage vital signs and the nursing notes.  Pertinent labs & imaging results that were available during my care of the patient were reviewed by me and considered in my medical decision making (see chart for details).     2:20 PM patient case discussed with Dr. Benjiman Core.  Orders placed.  Patient is not meeting SIRS criteria at this time, but will monitor lab and initiate code sepsis if indicated.  4:30 PM patient demonstrating urinary tract infection.  This is likely catheter associated.  Will initiate Zosyn.  Additional IV fluids.  No acute findings on CT abdomen and pelvis to explain patient's objective tenderness on exam.  Will order CT head without contrast given altered mental status with only explanatory cause at this time. EKG demonstrating slightly irregular rhythm, however sinus in origin.  No leukocytosis.  Kidney function normal and per baseline.  Will consult hospital medicine for admission for altered mental status in the setting of confirmed urinary tract infection and unable to tolerate p.o. antibiotics at this time.  This is a shared visit with Dr. Benjiman Core. Patient was independently evaluated by this attending physician. Attending physician consulted in evaluation and admission management.  6:32 PM Patient reevaluated.  Patient is more responsive with family at bedside.  Patient is opening eyes spontaneously and responding to  his name.  CT scan of the head noncontrast demonstrates no acute intracranial abnormality to explain patient's altered mental status.  No evidence of acute infarct in a large vessel pattern.  Patient has stable chronic lacunar infarcts.    7:38 PM Spoke with Dr. Katrinka Blazing of [facility], who will admit the patient.  Final Clinical Impressions(s) / ED Diagnoses   Final diagnoses:  Urinary tract infection associated with indwelling urethral catheter, initial encounter (HCC)  Altered mental status, unspecified altered mental status type  Aortic atherosclerosis [facility])    ED Discharge Orders    None       Delia Chimes 08/29/17 0039    Benjiman Core, MD 08/29/17 0700

## 2017-08-28 NOTE — ED Notes (Signed)
Bed: WA06 Expected date:  Expected time:  Means of arrival:  Comments: 84 yo AMS-UTI

## 2017-08-29 ENCOUNTER — Other Ambulatory Visit: Payer: Self-pay

## 2017-08-29 LAB — BASIC METABOLIC PANEL
Anion gap: 8 (ref 5–15)
BUN: 13 mg/dL (ref 6–20)
CALCIUM: 8.6 mg/dL — AB (ref 8.9–10.3)
CO2: 26 mmol/L (ref 22–32)
CREATININE: 0.89 mg/dL (ref 0.61–1.24)
Chloride: 109 mmol/L (ref 101–111)
GFR calc non Af Amer: >60 mL/min (ref >60)
Glucose, Bld: 93 mg/dL (ref 65–99)
Potassium: 3.9 mmol/L (ref 3.5–5.1)
SODIUM: 143 mmol/L (ref 135–145)

## 2017-08-29 LAB — CBC
HCT: 39 % (ref 39.0–52.0)
Hemoglobin: 12.5 g/dL — ABNORMAL LOW (ref 13.0–17.0)
MCH: 27.4 pg (ref 26.0–34.0)
MCHC: 32.1 g/dL (ref 30.0–36.0)
MCV: 85.5 fL (ref 78.0–100.0)
PLATELETS: 281 10*3/uL (ref 150–400)
RBC: 4.56 MIL/uL (ref 4.22–5.81)
RDW: 15.9 % — AB (ref 11.5–15.5)
WBC: 6.8 10*3/uL (ref 4.0–10.5)

## 2017-08-29 LAB — MRSA PCR SCREENING: MRSA BY PCR: POSITIVE — AB

## 2017-08-29 MED ORDER — HYDRALAZINE HCL 20 MG/ML IJ SOLN
5.0000 mg | INTRAMUSCULAR | Status: DC | PRN
  Administered 2017-08-29 – 2017-09-03 (×2): 5 mg via INTRAVENOUS
  Filled 2017-08-29 (×2): qty 1

## 2017-08-29 MED ORDER — CHLORHEXIDINE GLUCONATE 0.12 % MT SOLN
15.0000 mL | Freq: Two times a day (BID) | OROMUCOSAL | Status: DC
  Administered 2017-08-29 – 2017-09-05 (×14): 15 mL via OROMUCOSAL
  Filled 2017-08-29 (×14): qty 15

## 2017-08-29 MED ORDER — ORAL CARE MOUTH RINSE
15.0000 mL | Freq: Two times a day (BID) | OROMUCOSAL | Status: DC
  Administered 2017-08-29 – 2017-09-05 (×14): 15 mL via OROMUCOSAL

## 2017-08-29 MED ORDER — MUPIROCIN 2 % EX OINT
TOPICAL_OINTMENT | Freq: Two times a day (BID) | CUTANEOUS | Status: DC
  Administered 2017-08-29 – 2017-08-30 (×4): via NASAL
  Administered 2017-08-31: 1 via NASAL
  Administered 2017-08-31 – 2017-09-04 (×9): via NASAL
  Administered 2017-09-05: 1 via NASAL
  Filled 2017-08-29: qty 22

## 2017-08-29 NOTE — Progress Notes (Addendum)
Patient ID: [facility], male   DOB: Jan 20, 1931, 84 y.o.   MRN: [id number]    PROGRESS NOTE   [facility]  WUJ:[id number] DOB: [birth year] DOA: 08/28/2017  PCP: System, Pcp Not In   Brief Narrative:  84 yo male with severe Alzheimer dementia, HTN, CAD, OSA, presented with acute change in mental status. Per record review, pt is non verbal at baseline and is wheel chair bound. Family also reports noticing more lethargy, pt now able to swallow pills, right sided weakness.   Assessment & Plan:   Principal Problem:   Acute metabolic encephalopathy - due to acute UTI imposed on already known and severe Alzheimer's disease - pt more alert this AM but still not following commands - passed swallow eval, dys I diet recommended and order placed  - will continue Rocephin for now - follow up on urine cultures and readjust ABX regimen as indicated  - OK to discontinue tele to avoid agitation, no chest pain reported - will repeat 12 lead EKG in AM   Active Problems:   Alzheimer's dementia - continue to provide supportive care    Prolonged Q-T interval on ECG - 12 lead EKG in AM    Dysphagia, Right sided weakness - passed swallow eval as noted above - MRI brain pending - no sings of acute stroke on CT head     HTN - place on Hydralazine as needed   DVT prophylaxis: Lovenox SQ Code Status: DNR Family Communication: no family at bedside  Disposition Plan: to be determined   Consultants:   None  Procedures:   None  Antimicrobials:   Rocephin 03/01 -->  Subjective: No events overnight.   Objective: Vitals:   08/28/17 2030 08/28/17 2220 08/29/17 0533 08/29/17 1432  BP: (!) 156/61 (!) 149/84 (!) 151/69 (!) 171/55  Pulse: 64 72 75 64  Resp: 14 16 15 18   Temp:  98.4 F (36.9 C) (!) 97.5 F (36.4 C) 98 F (36.7 C)  TempSrc:  Oral Axillary Oral  SpO2: 96% 96% 97% 98%  Weight:  68.5 kg (151 lb)      Intake/Output Summary (Last 24 hours) at 08/29/2017 1828 Last data filed at  08/29/2017 1300 Gross per 24 hour  Intake 450 ml  Output 325 ml  Net 125 ml   Filed Weights   08/28/17 2220  Weight: 68.5 kg (151 lb)    Examination:  General exam: Appears calm and comfortable  Respiratory system: Clear to auscultation. Respiratory effort normal. Cardiovascular system: S1 & S2 heard, RRR. No JVD, murmurs, rubs, gallops or clicks. No pedal edema. Gastrointestinal system: Abdomen is nondistended, soft and nontender. No organomegaly or masses felt. Normal bowel sounds heard. Central nervous system: Alert, non verbal, working with SLP therapist   Data Reviewed: I have personally reviewed following labs and imaging studies  CBC: Recent Labs  Lab 08/28/17 1408 08/28/17 1417 08/29/17 0545  WBC 8.0  --  6.8  NEUTROABS 6.0  --   --   HGB 14.4 15.3 12.5*  HCT 44.9 45.0 39.0  MCV 87.5  --  85.5  PLT 320  --  281   Basic Metabolic Panel: Recent Labs  Lab 08/28/17 1408 08/28/17 1417 08/29/17 0545  NA 144 143 143  K 4.2 4.9 3.9  CL 107 106 109  CO2 27  --  26  GLUCOSE 112* 108* 93  BUN 16 19 13   CREATININE 1.06 1.10 0.89  CALCIUM 8.9  --  8.6*   Liver Function Tests:  Recent Labs  Lab 08/28/17 1408  AST 19  ALT 19  ALKPHOS 109  BILITOT 0.8  PROT 7.7  ALBUMIN 3.2*   Recent Labs  Lab 08/28/17 1408  LIPASE 44   CBG: Recent Labs  Lab 08/28/17 1519  GLUCAP 104*   Urine analysis:    Component Value Date/Time   COLORURINE YELLOW 08/28/2017 1418   APPEARANCEUR TURBID (A) 08/28/2017 1418   LABSPEC 1.023 08/28/2017 1418   PHURINE 5.0 08/28/2017 1418   GLUCOSEU NEGATIVE 08/28/2017 1418   HGBUR LARGE (A) 08/28/2017 1418   BILIRUBINUR NEGATIVE 08/28/2017 1418   KETONESUR NEGATIVE 08/28/2017 1418   PROTEINUR 100 (A) 08/28/2017 1418   NITRITE POSITIVE (A) 08/28/2017 1418   LEUKOCYTESUR MODERATE (A) 08/28/2017 1418   Recent Results (from the past 240 hour(s))  Blood culture (routine x 2)     Status: None (Preliminary result)   Collection Time:  08/28/17  2:09 PM  Result Value Ref Range Status   Specimen Description   Final    BLOOD RIGHT WRIST Performed at [facility], [address] [place], [place] [address]    Special Requests   Final    BOTTLES DRAWN AEROBIC AND ANAEROBIC Blood Culture adequate volume Performed at [facility], [address], [place], [place] [address]    Culture   Final    NO GROWTH < 24 HOURS Performed at [facility], [address] [place], [place] [address]    Report Status PENDING  Incomplete  Blood culture (routine x 2)     Status: None (Preliminary result)   Collection Time: 08/28/17  2:10 PM  Result Value Ref Range Status   Specimen Description   Final    BLOOD LEFT WRIST Performed at [facility], [address], [place], [place] [address]    Special Requests   Final    BOTTLES DRAWN AEROBIC AND ANAEROBIC Blood Culture adequate volume Performed at [facility], [address], [place], [place] [address]    Culture   Final    NO GROWTH < 24 HOURS Performed at [facility], [address], [place], [place] [address]    Report Status PENDING  Incomplete  Urine culture     Status: None (Preliminary result)   Collection Time: 08/28/17  2:18 PM  Result Value Ref Range Status   Specimen Description   Final    URINE, RANDOM Performed at [facility], [address], [place], [place] [address]    Special Requests   Final    NONE Performed at [facility], [address], [place], [place] [address]    Culture   Final    CULTURE REINCUBATED FOR BETTER GROWTH Performed at [facility], [address] [place], [place] [address]    Report Status PENDING  Incomplete  MRSA PCR Screening     Status: Abnormal   Collection Time: 08/29/17  5:14 AM  Result Value Ref Range Status   MRSA by PCR POSITIVE (A) NEGATIVE Final    Comment:        The GeneXpert MRSA  Assay (FDA approved for NASAL specimens only), is one component of a comprehensive MRSA colonization surveillance program. It is not intended to diagnose MRSA infection nor to guide or monitor treatment for MRSA infections. CRITICAL RESULT CALLED TO, READ BACK BY AND VERIFIED WITH: [facility] RN 08/29/2017 1107 JR Performed at [facility], [address] [place], [place] [id number]      Radiology Studies: Dg Chest 2 View Result Date: 08/28/2017 No active cardiopulmonary disease. Low lung volumes. Mild cardiomegaly.   Ct Head Wo Contrast Result Date: 08/28/2017 1. No acute intracranial abnormality. Moderately advanced chronic small vessel disease appears stable since 2018. 2. Chronic paranasal sinus disease has mildly progressed.   Ct Abdomen Pelvis W Contrast Result Date: 08/28/2017 1. No acute findings. 2. Sigmoid diverticula. 3. Coronary and aortoiliac   atherosclerosis (ICD10-170.0) 4. Postop changes as above.   Scheduled Meds: . chlorhexidine  15 mL Mouth Rinse BID  . enoxaparin (LOVENOX) injection  40 mg Subcutaneous Q24H  . mouth rinse  15 mL Mouth Rinse q12n4p  . mupirocin ointment   Nasal BID   Continuous Infusions: . cefTRIAXone (ROCEPHIN)  IV 1 g (08/28/17 2249)     LOS: 1 day   Time spent: 35 minutes   Debbora PrestoIskra Magick-Dora Simeone, MD [facility] Pager [phone]  If 7PM-7AM, please contact night-coverage [email or web address] Password [email or web address] 08/29/2017, 6:28 PM

## 2017-08-29 NOTE — Evaluation (Signed)
Clinical/Bedside Swallow Evaluation Patient Details  Name: Travis Wallace MRN: [id number] Date of Birth: [birth year]  Today's Date: 08/29/2017 Time: SLP Start Time (ACUTE ONLY): 0816 SLP Stop Time (ACUTE ONLY): 0836 SLP Time Calculation (min) (ACUTE ONLY): 20 min  Past Medical History:  Past Medical History:  Diagnosis Date  . Acute gangrenous cholecystitis 12/19/2016   s/p lap cholecystectomy 12/19/16  . Acute kidney injury (HCC)   . Alzheimer's dementia   . Alzheimer's dementia   . CAD (coronary artery disease) 12/17/2016  . Coronary artery disease   . Dementia 12/17/2016  . Depression   . Dyslipidemia   . GERD (gastroesophageal reflux disease)   . Hyperbilirubinemia   . Hypertension   . Insomnia   . OSA (obstructive sleep apnea) 12/17/2016  . Pericholecystic abscess 12/17/2016   s/p OR drainage 12/19/16  . Restless leg   . Severe sepsis (HCC) 12/17/2016  . Sinus bradycardia   . Sleep apnea    Past Surgical History:  Past Surgical History:  Procedure Laterality Date  . BACK SURGERY     x2  . CARDIAC CATHETERIZATION    . CATARACT EXTRACTION, BILATERAL    . CHOLECYSTECTOMY N/A 12/19/2016   Procedure: LAPAROSCOPIC CHOLECYSTECTOMY WITH INTRAOPERATIVE CHOLANGIOGRAM;  Surgeon: Ovidio Kin, MD;  Location: [facility];  Service: General;  Laterality: N/A;  . CORONARY ARTERY BYPASS GRAFT     X 3 VESSELS  . EYE SURGERY     HPI:  Travis Wallace is a 84 y.o. male with medical history significant of severe Alzheimer dementia with behavioral disturbance, HTN, CAD, OSA, GERD; who presents after being more altered.  Patient is nonverbal at baseline; wheelchair-bound and has some difficulty with fine motor skills, but previously was able to eat a regular diet. Recently admitted mid-Feb for UTI. Per notes, pt had a rapid decline over the past week; right-sided facial drooping, somnolent and unable to swallow pills. Admitted with UTI, acute encephalopathy. CT head revealed no acute findings; no MRI due to  leg contracture. CXR shows no active cardiopulmonary disease. No prior history of dysphagia found in chart.   Assessment / Plan / Recommendation Clinical Impression   Patient presents with what appears to be adequate airway protection at bedside. No overt signs of aspiration observed with thin liquids, puree or regular solids. Pt is fully alert, he follows 25% of basic commands with visual cue. Minimal right facial droop noted however this does not appear to impede oral control. Pt's greatest risk for aspiration is his mentation. While his mastication is functional for regular solids, he has poor awareness of bolus, consuming an entire cracker while holding solids orally. When cracker was finished, pt continued attempts to feed, biting the ends of his fingertips without awareness. SLP ultimately provided tactile cue (cup to hand) and pt cleared oral cavity with liquid wash. Recommend initiating dys 1, thin liquids, medications crushed in puree. Will follow for tolerance, advancement of solids if appropriate.   SLP Visit Diagnosis: Dysphagia, oral phase (R13.11)    Aspiration Risk  Mild aspiration risk;Moderate aspiration risk    Diet Recommendation Dysphagia 1 (Puree);Thin liquid   Liquid Administration via: Cup Medication Administration: Crushed with puree Supervision: Staff to assist with self feeding;Full supervision/cueing for compensatory strategies    Other  Recommendations Oral Care Recommendations: Oral care BID   Follow up Recommendations Skilled Nursing facility      Frequency and Duration min 2x/week  1 week       Prognosis Prognosis for Safe Diet Advancement: Good  Barriers to Reach Goals: Cognitive deficits      Swallow Study   General Date of Onset: 08/28/17 HPI: Travis MoloneyJohn Wallace is a 84 y.o. male with medical history significant of severe Alzheimer dementia with behavioral disturbance, HTN, CAD, OSA, GERD; who presents after being more altered.  Patient is nonverbal at  baseline; wheelchair-bound and has some difficulty with fine motor skills, but previously was able to eat a regular diet. Recently admitted mid-Feb for UTI. Per notes, pt had a rapid decline over the past week; right-sided facial drooping, somnolent and unable to swallow pills. Admitted with UTI, acute encephalopathy. CT head revealed no acute findings; no MRI due to leg contracture. CXR shows no active cardiopulmonary disease. No prior history of dysphagia found in chart. Type of Study: Bedside Swallow Evaluation Previous Swallow Assessment: none on file Diet Prior to this Study: NPO Temperature Spikes Noted: No Respiratory Status: Room air History of Recent Intubation: No Behavior/Cognition: Alert;Confused Oral Cavity Assessment: Dried secretions;Dry Oral Care Completed by SLP: Yes Oral Cavity - Dentition: Adequate natural dentition Vision: Functional for self-feeding Self-Feeding Abilities: Able to feed self;Needs assist Patient Positioning: Upright in bed Baseline Vocal Quality: Normal Volitional Cough: Cognitively unable to elicit Volitional Swallow: Unable to elicit    Oral/Motor/Sensory Function Overall Oral Motor/Sensory Function: Other (comment)(subtle r facial droop)   Ice Chips Ice chips: Within functional limits Presentation: Spoon   Thin Liquid Thin Liquid: Within functional limits Presentation: Cup;Self Fed    Nectar Thick Nectar Thick Liquid: Not tested   Honey Thick Honey Thick Liquid: Not tested   Puree Puree: Within functional limits Presentation: Spoon   Solid   GO   Solid: Impaired Presentation: Self Fed Oral Phase Impairments: Poor awareness of bolus Oral Phase Functional Implications: Oral holding       Rondel BatonMary Beth Penn Grissett, MS, CCC-SLP Speech-Language Pathologist [phone]  [name] 08/29/2017,8:40 AM

## 2017-08-29 NOTE — Progress Notes (Signed)
PT Cancellation Note  Patient Details Name: Travis MoloneyJohn Wallace MRN: [id number] DOB: 04/29/1931   Cancelled Treatment:    Reason Eval/Treat Not Completed: Other (comment)Noted patient from [name], WC bound. Will evaluate as schedule allows.    Rada HayHill, Irini Leet Elizabeth 08/29/2017, 7:24 AM Blanchard KelchKaren Avyan Livesay PT [phone]

## 2017-08-29 NOTE — Progress Notes (Signed)
OT Cancellation Note  Patient Details Name: Travis MoloneyJohn Wallace MRN: [id number] DOB: 05/11/1931   Cancelled Treatment:    Noted pt from a [name]- [name] [place], [place] [phone] Einar CrowEDDING, Kassidy Frankson D 08/29/2017, 12:08 PM

## 2017-08-30 ENCOUNTER — Other Ambulatory Visit: Payer: Self-pay

## 2017-08-30 LAB — CBC
HCT: 38.6 % — ABNORMAL LOW (ref 39.0–52.0)
Hemoglobin: 12.6 g/dL — ABNORMAL LOW (ref 13.0–17.0)
MCH: 27.8 pg (ref 26.0–34.0)
MCHC: 32.6 g/dL (ref 30.0–36.0)
MCV: 85.2 fL (ref 78.0–100.0)
Platelets: 329 10*3/uL (ref 150–400)
RBC: 4.53 MIL/uL (ref 4.22–5.81)
RDW: 15.6 % — AB (ref 11.5–15.5)
WBC: 6.5 10*3/uL (ref 4.0–10.5)

## 2017-08-30 LAB — BLOOD CULTURE ID PANEL (REFLEXED)
Acinetobacter baumannii: NOT DETECTED
CANDIDA ALBICANS: NOT DETECTED
CANDIDA GLABRATA: NOT DETECTED
Candida krusei: NOT DETECTED
Candida parapsilosis: NOT DETECTED
Candida tropicalis: NOT DETECTED
ENTEROBACTER CLOACAE COMPLEX: NOT DETECTED
Enterobacteriaceae species: NOT DETECTED
Enterococcus species: NOT DETECTED
Escherichia coli: NOT DETECTED
Haemophilus influenzae: NOT DETECTED
Klebsiella oxytoca: NOT DETECTED
Klebsiella pneumoniae: NOT DETECTED
Listeria monocytogenes: NOT DETECTED
NEISSERIA MENINGITIDIS: NOT DETECTED
PROTEUS SPECIES: NOT DETECTED
PSEUDOMONAS AERUGINOSA: NOT DETECTED
STAPHYLOCOCCUS SPECIES: NOT DETECTED
STREPTOCOCCUS AGALACTIAE: NOT DETECTED
STREPTOCOCCUS PNEUMONIAE: NOT DETECTED
Serratia marcescens: NOT DETECTED
Staphylococcus aureus (BCID): NOT DETECTED
Streptococcus pyogenes: NOT DETECTED
Streptococcus species: NOT DETECTED

## 2017-08-30 LAB — BASIC METABOLIC PANEL
ANION GAP: 7 (ref 5–15)
BUN: 11 mg/dL (ref 6–20)
CALCIUM: 8.5 mg/dL — AB (ref 8.9–10.3)
CO2: 24 mmol/L (ref 22–32)
Chloride: 107 mmol/L (ref 101–111)
Creatinine, Ser: 0.79 mg/dL (ref 0.61–1.24)
Glucose, Bld: 99 mg/dL (ref 65–99)
Potassium: 3.9 mmol/L (ref 3.5–5.1)
Sodium: 138 mmol/L (ref 135–145)

## 2017-08-30 MED ORDER — DIVALPROEX SODIUM 125 MG PO CSDR
250.0000 mg | DELAYED_RELEASE_CAPSULE | Freq: Every day | ORAL | Status: DC
  Administered 2017-08-30 – 2017-09-04 (×6): 250 mg via ORAL
  Filled 2017-08-30 (×6): qty 2

## 2017-08-30 MED ORDER — PANTOPRAZOLE SODIUM 40 MG PO TBEC
40.0000 mg | DELAYED_RELEASE_TABLET | Freq: Every day | ORAL | Status: DC
  Administered 2017-08-30 – 2017-09-01 (×3): 40 mg via ORAL
  Filled 2017-08-30 (×3): qty 1

## 2017-08-30 MED ORDER — DIVALPROEX SODIUM 125 MG PO CSDR
125.0000 mg | DELAYED_RELEASE_CAPSULE | ORAL | Status: DC
Start: 2017-08-30 — End: 2017-08-30

## 2017-08-30 MED ORDER — DIVALPROEX SODIUM 125 MG PO CSDR
125.0000 mg | DELAYED_RELEASE_CAPSULE | Freq: Every day | ORAL | Status: DC
  Administered 2017-08-30 – 2017-09-05 (×7): 125 mg via ORAL
  Filled 2017-08-30 (×7): qty 1

## 2017-08-30 MED ORDER — ROPINIROLE HCL 1 MG PO TABS
1.0000 mg | ORAL_TABLET | Freq: Every day | ORAL | Status: DC
  Administered 2017-08-30 – 2017-09-04 (×6): 1 mg via ORAL
  Filled 2017-08-30 (×6): qty 1

## 2017-08-30 MED ORDER — DOXAZOSIN MESYLATE 4 MG PO TABS
4.0000 mg | ORAL_TABLET | Freq: Every day | ORAL | Status: DC
  Administered 2017-08-30 – 2017-09-05 (×7): 4 mg via ORAL
  Filled 2017-08-30 (×7): qty 1

## 2017-08-30 MED ORDER — PSYLLIUM 95 % PO PACK
1.0000 | PACK | Freq: Every day | ORAL | Status: DC
Start: 2017-08-30 — End: 2017-09-05
  Administered 2017-08-30 – 2017-09-05 (×7): 1 via ORAL
  Filled 2017-08-30 (×7): qty 1

## 2017-08-30 MED ORDER — ASPIRIN EC 81 MG PO TBEC
81.0000 mg | DELAYED_RELEASE_TABLET | Freq: Every day | ORAL | Status: DC
  Administered 2017-08-30 – 2017-09-01 (×3): 81 mg via ORAL
  Filled 2017-08-30 (×3): qty 1

## 2017-08-30 MED ORDER — SODIUM CHLORIDE 0.9 % IV SOLN
1.0000 g | Freq: Three times a day (TID) | INTRAVENOUS | Status: DC
  Administered 2017-08-30 – 2017-09-01 (×6): 1 g via INTRAVENOUS
  Filled 2017-08-30 (×6): qty 1

## 2017-08-30 MED ORDER — ACETAMINOPHEN 325 MG PO TABS
650.0000 mg | ORAL_TABLET | Freq: Four times a day (QID) | ORAL | Status: DC | PRN
  Administered 2017-09-02: 650 mg via ORAL
  Filled 2017-08-30: qty 2

## 2017-08-30 NOTE — Progress Notes (Signed)
PHARMACY - PHYSICIAN COMMUNICATION CRITICAL VALUE ALERT - BLOOD CULTURE IDENTIFICATION (BCID)  [name] is an 84 y.o. male who [facility] with a chief complaint of encephalopathy.  Assessment:  Afebrile, WBC normal. (include suspected source if known)  Name of physician (or Provider) Contacted: Dr. Izola PriceMyers  Current antibiotics: Rocephin 1g IV q24h for possible UTI, culture pending  Changes to prescribed antibiotics recommended: likely contaminant Patient is on recommended antibiotics - No changes needed  Results for orders placed or performed during the hospital encounter of 08/28/17  Blood Culture ID Panel (Reflexed) (Collected: 08/28/2017  2:10 PM)  Result Value Ref Range   Enterococcus species NOT DETECTED NOT DETECTED   Listeria monocytogenes NOT DETECTED NOT DETECTED   Staphylococcus species NOT DETECTED NOT DETECTED   Staphylococcus aureus NOT DETECTED NOT DETECTED   Streptococcus species NOT DETECTED NOT DETECTED   Streptococcus agalactiae NOT DETECTED NOT DETECTED   Streptococcus pneumoniae NOT DETECTED NOT DETECTED   Streptococcus pyogenes NOT DETECTED NOT DETECTED   Acinetobacter baumannii NOT DETECTED NOT DETECTED   Enterobacteriaceae species NOT DETECTED NOT DETECTED   Enterobacter cloacae complex NOT DETECTED NOT DETECTED   Escherichia coli NOT DETECTED NOT DETECTED   Klebsiella oxytoca NOT DETECTED NOT DETECTED   Klebsiella pneumoniae NOT DETECTED NOT DETECTED   Proteus species NOT DETECTED NOT DETECTED   Serratia marcescens NOT DETECTED NOT DETECTED   Haemophilus influenzae NOT DETECTED NOT DETECTED   Neisseria meningitidis NOT DETECTED NOT DETECTED   Pseudomonas aeruginosa NOT DETECTED NOT DETECTED   Candida albicans NOT DETECTED NOT DETECTED   Candida glabrata NOT DETECTED NOT DETECTED   Candida krusei NOT DETECTED NOT DETECTED   Candida parapsilosis NOT DETECTED NOT DETECTED   Candida tropicalis NOT DETECTED NOT DETECTED    Loralee PacasErin  Fatou Dunnigan, PharmD, BCPS Pager: [phone] 08/30/2017  8:02 AM

## 2017-08-30 NOTE — NC FL2 (Signed)
[place] MEDICAID FL2 LEVEL OF CARE SCREENING TOOL     IDENTIFICATION  Patient Name: Travis Wallace Birthdate: [birth year] Sex: male Admission Date (Current Location): 08/28/2017  [facility] and [place] Number:  Producer, television/film/video and Address:  [facility],  [address] [place] [address]      Provider Number: [id number]  Attending Physician Name and Address:  Dorothea Ogle, MD  Relative Name and Phone Number:       Current Level of Care: Hospital Recommended Level of Care: Skilled Nursing Facility Prior Approval Number:    Date Approved/Denied:   PASRR Number:    Discharge Plan: [name]    Current Diagnoses: Patient Active Problem List   Diagnosis Date Noted  . Complicated UTI (urinary tract infection) 08/28/2017  . Prolonged Q-T interval on ECG 08/28/2017  . Dysphagia 08/28/2017  . Right sided weakness 08/28/2017  . Lactic acidosis 12/27/2016  . HLD (hyperlipidemia) 12/27/2016  . Pericholecystic abscess s/p OR drainage 12/19/2016 12/21/2016  . Delirium 12/21/2016  . Alzheimer's dementia   . Hypertension   . GERD (gastroesophageal reflux disease)   . Sleep apnea   . Restless leg   . Insomnia   . Sinus bradycardia 12/19/2016  . Hyperbilirubinemia 12/19/2016  . AKI (acute kidney injury) (HCC) 12/19/2016  . Altered mental status   . Cholecystitis   . Acute gangrenous cholecystitis s/p lap cholecystectomy 12/19/2016 12/17/2016  . Cholangitis 12/17/2016  . Severe sepsis (HCC) 12/17/2016  . CAD (coronary artery disease) 12/17/2016  . HTN (hypertension) 12/17/2016  . OSA (obstructive sleep apnea) 12/17/2016  . Sepsis (HCC) 12/17/2016  . Dementia with aggressive behavior 12/17/2016  . Pericholecystic abscess 12/17/2016    Orientation RESPIRATION BLADDER Height & Weight        Normal Incontinent, Indwelling catheter Weight: 151 lb (68.5 kg) Height:  5\' 4"  (162.6 cm)  BEHAVIORAL SYMPTOMS/MOOD NEUROLOGICAL BOWEL NUTRITION STATUS      Incontinent  Diet(see DC summary)  AMBULATORY STATUS COMMUNICATION OF NEEDS Skin   Total Care Verbally Normal                       Personal Care Assistance Level of Assistance  Bathing, Dressing Bathing Assistance: Maximum assistance   Dressing Assistance: Maximum assistance     Functional Limitations Info             SPECIAL CARE FACTORS FREQUENCY  PT (By licensed PT), OT (By licensed OT)     PT Frequency: 5/wk OT Frequency: 5/wk            Contractures      Additional Factors Info  Code Status, Allergies Code Status Info: DNR Allergies Info: Memantine           Current Medications (08/30/2017):  This is the current hospital active medication list Current Facility-Administered Medications  Medication Dose Route Frequency Provider Last Rate Last Dose  . acetaminophen (TYLENOL) tablet 650 mg  650 mg Oral Q6H PRN Dorothea Ogle, MD      . albuterol (PROVENTIL) (2.5 MG/3ML) 0.083% nebulizer solution 2.5 mg  2.5 mg Nebulization Q6H PRN Clydie Braun, MD      . aspirin EC tablet 81 mg  81 mg Oral Daily Dorothea Ogle, MD      . cefTRIAXone (ROCEPHIN) 1 g in sodium chloride 0.9 % 100 mL IVPB  1 g Intravenous Q24H Phylliss Blakes, [facility]   Stopped at 08/30/17 0054  . chlorhexidine (PERIDEX) 0.12 % solution  15 mL  15 mL Mouth Rinse BID Katrinka BlazingSmith, Rondell A, MD   15 mL at 08/29/17 1053  . divalproex (DEPAKOTE SPRINKLE) capsule 125 mg  125 mg Oral Daily Green, Terri L, RPH       And  . divalproex (DEPAKOTE SPRINKLE) capsule 250 mg  250 mg Oral QHS Green, Terri L, RPH      . doxazosin (CARDURA) tablet 4 mg  4 mg Oral Daily Dorothea OgleMyers, Iskra M, MD      . enoxaparin (LOVENOX) injection 40 mg  40 mg Subcutaneous Q24H Smith, Rondell A, MD   40 mg at 08/29/17 2122  . hydrALAZINE (APRESOLINE) injection 5 mg  5 mg Intravenous Q4H PRN Dorothea OgleMyers, Iskra M, MD   5 mg at 08/29/17 1914  . iopamidol (ISOVUE-370) 76 % injection 100 mL  100 mL Intravenous Once PRN Benjiman CorePickering, Nathan, MD      . MEDLINE mouth  rinse  15 mL Mouth Rinse q12n4p Smith, Rondell A, MD   15 mL at 08/30/17 1023  . mupirocin ointment (BACTROBAN) 2 %   Nasal BID Dorothea OgleMyers, Iskra M, MD      . pantoprazole (PROTONIX) EC tablet 40 mg  40 mg Oral Daily Dorothea OgleMyers, Iskra M, MD      . psyllium (HYDROCIL/METAMUCIL) packet 1 packet  1 packet Oral Daily Dorothea OgleMyers, Iskra M, MD      . rOPINIRole (REQUIP) tablet 1 mg  1 mg Oral QHS Dorothea OgleMyers, Iskra M, MD         Discharge Medications: Please see discharge summary for a list of discharge medications.  Relevant Imaging Results:  Relevant Lab Results:   Additional Information SS#[id number]  Burna SisUris, Osamu Olguin H, LCSW

## 2017-08-30 NOTE — Progress Notes (Signed)
Patient ID: Travis MoloneyJohn Wallace, male   DOB: 03/11/1931, 84 y.o.   MRN: [id number]    PROGRESS NOTE   Travis MoloneyJohn Wallace  [id number] DOB: 01/23/1931 DOA: 08/28/2017  PCP: System, Pcp Not In   Brief Narrative:  84 yo male with severe Alzheimer dementia, HTN, CAD, OSA, presented with acute change in mental status. Per record review, pt is non verbal at baseline and is wheel chair bound. Family also reports noticing more lethargy, pt now able to swallow pills, right sided weakness.   Assessment & Plan:   Principal Problem:   Acute metabolic encephalopathy - due to acute UTI imposed on already known and severe Alzheimer's disease - pt more alert this AM, still not at his baseline per daughter  - passed swallow eval, dys I diet recommended, pt so far tolerating well, pt does need assistance with feeding  - urine cultures positive for Pseudomonas, will change Rocephin to Ceftazidime  - did not put on Cipro due to QT prolongation reported on admission - can repeat EKG in AM and change ABX to oral Cipro if QT stable  - blood culutre 1/2 with G+ cocci in clusters, possible contaminant, will follow final report   Active Problems:   Pseudomonas UTI, related to urinary cath - changed Rocephin to Ceftazidime - possible change to oral Cipro in next 24-48 hours depending on clinical status and if QT interval stable     Alzheimer's dementia - continue to provide supportive care, treat UTI as noted abvoe     Prolonged Q-T interval on ECG - EKG in AM    Dysphagia, Right sided weakness - passed swallow eval as noted above - MRI brain cancelled, I have discussed this with daughter and she has agreed to continue supportive care as management would not change based on MRI - no sings of acute stroke on CT head     HTN - added hydralazine as needed   DVT prophylaxis: Lovenox SQ Code Status: DNR Family Communication: daughter updated over the phone  Disposition Plan: [name] likely by Tuesday if able to transition  to oral ABX   Consultants:   None  Procedures:   None  Antimicrobials:   Rocephin 03/01 --> 03/03  Ceftazidime 03/03 -->  Subjective: No events overnight.   Objective: Vitals:   08/29/17 1432 08/29/17 1910 08/29/17 2107 08/30/17 0518  BP: (!) 171/55 (!) 162/54 (!) 123/58 (!) 148/62  Pulse: 64  66 62  Resp: 18  17 17   Temp: 98 F (36.7 C)  97.9 F (36.6 C) (!) 97.5 F (36.4 C)  TempSrc: Oral  Oral Oral  SpO2: 98%  98% 98%  Weight:      Height:    5\' 4"  (1.626 m)    Intake/Output Summary (Last 24 hours) at 08/30/2017 1412 Last data filed at 08/30/2017 0520 Gross per 24 hour  Intake 650 ml  Output 1175 ml  Net -525 ml   Filed Weights   08/28/17 2220  Weight: 68.5 kg (151 lb)    Physical Exam  Constitutional: Appears calm, NAD CVS: RRR, S1/S2 +, no murmurs, no gallops, no carotid bruit.  Pulmonary: Effort and breath sounds normal, no stridor, rhonchi, wheezes, rales.  Abdominal: Soft. BS +,  no distension, tenderness, rebound or guarding.   Data Reviewed: I have personally reviewed following labs and imaging studies  CBC: Recent Labs  Lab 08/28/17 1408 08/28/17 1417 08/29/17 0545 08/30/17 0607  WBC 8.0  --  6.8 6.5  NEUTROABS 6.0  --   --   --  HGB 14.4 15.3 12.5* 12.6*  HCT 44.9 45.0 39.0 38.6*  MCV 87.5  --  85.5 85.2  PLT 320  --  281 329   Basic Metabolic Panel: Recent Labs  Lab 08/28/17 1408 08/28/17 1417 08/29/17 0545 08/30/17 0607  NA 144 143 143 138  K 4.2 4.9 3.9 3.9  CL 107 106 109 107  CO2 27  --  26 24  GLUCOSE 112* 108* 93 99  BUN 16 19 13 11   CREATININE 1.06 1.10 0.89 0.79  CALCIUM 8.9  --  8.6* 8.5*   Liver Function Tests: Recent Labs  Lab 08/28/17 1408  AST 19  ALT 19  ALKPHOS 109  BILITOT 0.8  PROT 7.7  ALBUMIN 3.2*   Recent Labs  Lab 08/28/17 1408  LIPASE 44   CBG: Recent Labs  Lab 08/28/17 1519  GLUCAP 104*   Urine analysis:    Component Value Date/Time   COLORURINE YELLOW 08/28/2017 1418    APPEARANCEUR TURBID (A) 08/28/2017 1418   LABSPEC 1.023 08/28/2017 1418   PHURINE 5.0 08/28/2017 1418   GLUCOSEU NEGATIVE 08/28/2017 1418   HGBUR LARGE (A) 08/28/2017 1418   BILIRUBINUR NEGATIVE 08/28/2017 1418   KETONESUR NEGATIVE 08/28/2017 1418   PROTEINUR 100 (A) 08/28/2017 1418   NITRITE POSITIVE (A) 08/28/2017 1418   LEUKOCYTESUR MODERATE (A) 08/28/2017 1418   Recent Results (from the past 240 hour(s))  Blood culture (routine x 2)     Status: None (Preliminary result)   Collection Time: 08/28/17  2:09 PM  Result Value Ref Range Status   Specimen Description   Final    BLOOD RIGHT WRIST Performed at [facility], [address] [place], [place] [address]    Special Requests   Final    BOTTLES DRAWN AEROBIC AND ANAEROBIC Blood Culture adequate volume Performed at [facility], [address] [place], [place] [address]    Culture   Final    NO GROWTH < 24 HOURS Performed at [facility], [address], [place], [place] [address]    Report Status PENDING  Incomplete  Blood culture (routine x 2)     Status: None (Preliminary result)   Collection Time: 08/28/17  2:10 PM  Result Value Ref Range Status   Specimen Description   Final    BLOOD LEFT WRIST Performed at [facility], [address] [place], [place] [address]    Special Requests   Final    BOTTLES DRAWN AEROBIC AND ANAEROBIC Blood Culture adequate volume Performed at [facility], [address], [place], [place] [address]    Culture  Setup Time   Final    AEROBIC BOTTLE ONLY GRAM POSITIVE COCCI IN CLUSTERS Organism ID to follow CRITICAL RESULT CALLED TO, READ BACK BY AND VERIFIED WITH: D WOFFORD,PHARMD AT 0703 BY L BENFIELD Performed at [facility], [address] [place], [place] [address]    Culture GRAM POSITIVE COCCI  Final   Report Status PENDING  Incomplete  Blood Culture ID Panel (Reflexed)     Status: None    Collection Time: 08/28/17  2:10 PM  Result Value Ref Range Status   Enterococcus species NOT DETECTED NOT DETECTED Final   Listeria monocytogenes NOT DETECTED NOT DETECTED Final   Staphylococcus species NOT DETECTED NOT DETECTED Final   Staphylococcus aureus NOT DETECTED NOT DETECTED Final   Streptococcus species NOT DETECTED NOT DETECTED Final   Streptococcus agalactiae NOT DETECTED NOT DETECTED  Final   Streptococcus pneumoniae NOT DETECTED NOT DETECTED Final   Streptococcus pyogenes NOT DETECTED NOT DETECTED Final   Acinetobacter baumannii NOT DETECTED NOT DETECTED Final   Enterobacteriaceae species NOT DETECTED NOT DETECTED Final   Enterobacter cloacae complex NOT DETECTED NOT DETECTED Final   Escherichia coli NOT DETECTED NOT DETECTED Final   Klebsiella oxytoca NOT DETECTED NOT DETECTED Final   Klebsiella pneumoniae NOT DETECTED NOT DETECTED Final   Proteus species NOT DETECTED NOT DETECTED Final   Serratia marcescens NOT DETECTED NOT DETECTED Final   Haemophilus influenzae NOT DETECTED NOT DETECTED Final   Neisseria meningitidis NOT DETECTED NOT DETECTED Final   Pseudomonas aeruginosa NOT DETECTED NOT DETECTED Final   Candida albicans NOT DETECTED NOT DETECTED Final   Candida glabrata NOT DETECTED NOT DETECTED Final   Candida krusei NOT DETECTED NOT DETECTED Final   Candida parapsilosis NOT DETECTED NOT DETECTED Final   Candida tropicalis NOT DETECTED NOT DETECTED Final    Comment: Performed at [facility], [address] [place], [place] [address]  Urine culture     Status: Abnormal (Preliminary result)   Collection Time: 08/28/17  2:18 PM  Result Value Ref Range Status   Specimen Description   Final    URINE, RANDOM Performed at [facility], [address] [place], [place] [address]    Special Requests   Final    NONE Performed at [facility], [address] [place], [place] [address]    Culture (A)  Final    >=100,000  COLONIES/mL PSEUDOMONAS AERUGINOSA >=100,000 COLONIES/mL ENTEROCOCCUS FAECALIS SUSCEPTIBILITIES TO FOLLOW Performed at [facility], [address] [place], [place] [address]    Report Status PENDING  Incomplete   Organism ID, Bacteria PSEUDOMONAS AERUGINOSA (A)  Final      Susceptibility   Pseudomonas aeruginosa - MIC*    CEFTAZIDIME 4 SENSITIVE Sensitive     CIPROFLOXACIN <=0.25 SENSITIVE Sensitive     GENTAMICIN <=1 SENSITIVE Sensitive     IMIPENEM 2 SENSITIVE Sensitive     PIP/TAZO 8 SENSITIVE Sensitive     CEFEPIME 2 SENSITIVE Sensitive     * >=100,000 COLONIES/mL PSEUDOMONAS AERUGINOSA  MRSA PCR Screening     Status: Abnormal   Collection Time: 08/29/17  5:14 AM  Result Value Ref Range Status   MRSA by PCR POSITIVE (A) NEGATIVE Final    Comment:        The GeneXpert MRSA Assay (FDA approved for NASAL specimens only), is one component of a comprehensive MRSA colonization surveillance program. It is not intended to diagnose MRSA infection nor to guide or monitor treatment for MRSA infections. CRITICAL RESULT CALLED TO, READ BACK BY AND VERIFIED WITH: [facility] RN 08/29/2017 1107 JR Performed at [facility], [address], [place], [place] [address]      Radiology Studies: Dg Chest 2 View Result Date: 08/28/2017 No active cardiopulmonary disease. Low lung volumes. Mild cardiomegaly.   Ct Head Wo Contrast Result Date: 08/28/2017 1. No acute intracranial abnormality. Moderately advanced chronic small vessel disease appears stable since 2018. 2. Chronic paranasal sinus disease has mildly progressed.   Ct Abdomen Pelvis W Contrast Result Date: 08/28/2017 1. No acute findings. 2. Sigmoid diverticula. 3. Coronary and aortoiliac   atherosclerosis (ICD10-170.0) 4. Postop changes as above.   Scheduled Meds: . aspirin EC  81 mg Oral Daily  . chlorhexidine  15 mL Mouth Rinse BID  . divalproex  125 mg Oral Daily  And  . divalproex  250 mg Oral QHS    . doxazosin  4 mg Oral Daily  . enoxaparin (LOVENOX) injection  40 mg Subcutaneous Q24H  . mouth rinse  15 mL Mouth Rinse q12n4p  . mupirocin ointment   Nasal BID  . pantoprazole  40 mg Oral Daily  . psyllium  1 packet Oral Daily  . rOPINIRole  1 mg Oral QHS   Continuous Infusions:    LOS: 2 days   Time spent 25 minutes with > 50% of time discussing current diagnostic test results, clinical impression and plan of care.   Debbora Presto, MD [facility] Pager [phone]  If 7PM-7AM, please contact night-coverage [email or web address] Password [email or web address] 08/30/2017, 2:12 PM

## 2017-08-30 NOTE — Progress Notes (Signed)
Pharmacy Antibiotic Note  Travis MoloneyJohn Wallace is a 84 y.o. male admitted on 08/28/2017 with UTI.  Pharmacy has been consulted for ceftazidime dosing.  Plan: Ceftazidime 1g IV q8  Height: 5\' 4"  (162.6 cm) Weight: 151 lb (68.5 kg) IBW/kg (Calculated) : 59.2  Temp (24hrs), Avg:97.8 F (36.6 C), Min:97.5 F (36.4 C), Max:98 F (36.7 C)  Recent Labs  Lab 08/28/17 1408 08/28/17 1417 08/29/17 0545 08/30/17 0607  WBC 8.0  --  6.8 6.5  CREATININE 1.06 1.10 0.89 0.79  LATICACIDVEN  --  1.05  --   --     Estimated Creatinine Clearance: 57.6 mL/min (by C-G formula based on SCr of 0.79 mg/dL).    Allergies  Allergen Reactions  . Memantine Other (See Comments)    Reaction:  Bradycardia  Other reaction(s): Other (See Comments) Other reaction(s): Other (See Comments) Reaction:Bradycardia  Reaction:  Bradycardia       Thank you for allowing pharmacy to be a part of this patient's care.   Hessie KnowsJustin M Leanore Wallace, PharmD, BCPS Pager [phone] 08/30/2017 2:20 PM

## 2017-08-31 LAB — URINE CULTURE: Culture: >=100000 — AB

## 2017-08-31 MED ORDER — SODIUM CHLORIDE 0.9 % IV SOLN
1.0000 g | Freq: Four times a day (QID) | INTRAVENOUS | Status: DC
  Administered 2017-08-31 – 2017-09-01 (×3): 1 g via INTRAVENOUS
  Filled 2017-08-31 (×2): qty 1
  Filled 2017-08-31 (×3): qty 1000

## 2017-08-31 NOTE — Clinical Social Work Note (Signed)
Clinical Social Work Assessment  Patient Details  Name: Travis Wallace MRN: [id number] Date of Birth: 09/08/1930  Date of referral:  08/31/17               Reason for consult:  Facility Placement                Permission sought to share information with:  Family Supports, Magazine features editoracility Contact Representative, Case Estate manager/land agentManager Permission granted to share information::  (Patient has dementia )  Name::     Chief Financial OfficerDeena  Agency::  [facility]  Relationship::  Daughter  Contact Information:     Housing/Transportation Living arrangements for the past 2 months:  Skilled Building surveyorursing Facility Source of Information:  Facility, Adult Children Patient Interpreter Needed:  None Criminal Activity/Legal Involvement Pertinent to Current Situation/Hospitalization:  No - Comment as needed Significant Relationships:  Adult Children, Merchandiser, retailCommunity Support Lives with:    Do you feel safe going back to the place where you live?  Yes Need for family participation in patient care:  Yes (Comment)  Care giving concerns:  Patient's daughter Travis DryDeena did not express any concerns with facility care during the time of assessment.    Social Worker assessment / plan:  LCSW following for [name] placement.  [facility]   Patient admitted for altered mental status. Patient has history of dementia and is presenting with a UTI.   LCSW spoke with patient's daughter since patient is unable to participate due to altered mental status.   Travis Wallace reports that patient went to [facility] for rehab last August floowing a hip replacement. From there he transitioned in to long term care. Patient uses a wheelchair at baseline. Travis Wallace reports that patient requires assistnce in ADLs . Daughter states that patient was able to feed himself before he became sick.   PLAN: [facility] will [facility].   Employment status:  Retired Health and safety inspectornsurance information:  Medicare PT Recommendations:  Not assessed at this time Information / Referral to community  resources:     Patient/Family's Response to care:  Daughter asked questions about discharge. LCSW answered questions. Travis Wallace thanked LCSW for her call.   Patient/Family's Understanding of and Emotional Response to Diagnosis, Current Treatment, and Prognosis:  Travis Wallace reports that she is pleased with care. Daughter is agreeable to patients return to [name] at DC.   Emotional Assessment Appearance:  Appears stated age Attitude/Demeanor/Rapport:  Unable to Assess Affect (typically observed):  Unable to Assess Orientation:  Oriented to Self Alcohol / Substance use:  Not Applicable Psych involvement (Current and /or in the community):  No (Comment)  Discharge Needs  Concerns to be addressed:  No discharge needs identified Readmission within the last 30 days:  No Current discharge risk:  None Barriers to Discharge:  Continued Medical Work up   BJ'sBernette Larnell Granlund, LCSW 08/31/2017, 1:51 PM

## 2017-08-31 NOTE — Care Management Important Message (Signed)
Important Message  Patient Details IM Letter given to Nora/Case Manager to present to the Patient  Name: Travis MoloneyJohn Wallace MRN: [id number] Date of Birth: 10/09/1930   Medicare Important Message Given:  Yes    [name], Rushie Brazel 08/31/2017, 11:03 AMImportant Message  Patient Details  Name: Travis MoloneyJohn Wallace MRN: [id number] Date of Birth: 11/23/1930   Medicare Important Message Given:  Yes    [name], Aariv Medlock 08/31/2017, 11:02 AM

## 2017-08-31 NOTE — Progress Notes (Signed)
  Speech Language Pathology Treatment: Dysphagia  Patient Details Name: Travis Wallace MRN: [id number] DOB: 1931/05/24 Today's Date: 08/31/2017 Time: 0301-3143 SLP Time Calculation (min) (ACUTE ONLY): 8 min  Assessment / Plan / Recommendation Clinical Impression  Despite advanced dementia, pt is really eating and protecting his airway quite well.  At this time, he is enthusiastic about eating purees and is doing so safely.  There are no s/s of aspiration with consumption of thin liquids.  He is afebrile. Recommend pt continue dysphagia 1 diet with thin liquids upon D/C to [name]; continue meds crushed in puree.  [name] SLP to address diet progression in context of that environment with regard to safety and assistance available.  No further acute care SLP needs identified - our services will sign off.   HPI HPI: Travis Wallace is a 84 y.o. male with medical history significant of severe Alzheimer dementia with behavioral disturbance, HTN, CAD, OSA, GERD; who presents after being more altered.  Patient is nonverbal at baseline; wheelchair-bound and has some difficulty with fine motor skills, but previously was able to eat a regular diet. Recently admitted mid-Feb for UTI. Per notes, pt had a rapid decline over the past week; right-sided facial drooping, somnolent and unable to swallow pills. Admitted with UTI, acute encephalopathy. CT head revealed no acute findings; no MRI due to leg contracture. CXR shows no active cardiopulmonary disease. No prior history of dysphagia found in chart.      SLP Plan  All goals met       Recommendations  Diet recommendations: Thin liquid;Dysphagia 1 (puree) Liquids provided via: Cup;Straw Medication Administration: Crushed with puree Supervision: Patient able to self feed;Staff to assist with self feeding Compensations: Minimize environmental distractions Postural Changes and/or Swallow Maneuvers: Seated upright 90 degrees                Oral Care Recommendations:  Oral care BID Follow up Recommendations: Skilled Nursing facility SLP Visit Diagnosis: Dysphagia, oral phase (R13.11) Plan: All goals met       GO                Travis Wallace 08/31/2017, 12:39 PM

## 2017-08-31 NOTE — Progress Notes (Signed)
[facility] PROGRESS NOTE  Travis MoloneyJohn Wallace XBM:[id number] DOB: 05/23/1931 DOA: 08/28/2017 PCP: System, Pcp Not In  Brief summary   84 yo male with severe Alzheimer severe dementia, non verbal, HTN, CAD h/o CABG, OSA, presented with acute change in mental status. Per record review, pt is non verbal at baseline and is wheel chair bound. Family also reports noticing more lethargy, pt now able to swallow pills, right sided weakness.   Assessment/Plan:  Acute metabolic encephalopathy on top of severe dementia. due to acute UTI imposed on already known and severe Alzheimer's disease. passed swallow eval, dys I diet recommended, pt so far tolerating well, pt does need assistance with feeding CT head: no acute infarcts. Cont to treat UTI. Monitor   UTI, related to urinary cath. urine cultures pseudomonas and enterococcus. changed Rocephin to Ceftazidime, added ampicillin. Blood cultures 1/2 coag neg -likely contaminant. Will obtain repeat blood cultures  -possible change to oral Cipro in next 24-48 hours   Alzheimer's dementia. continue to provide supportive care, treat UTI as noted abvoe   Prolonged Q-T interval on ECG. Resolved   Dysphagia, Right sided weakness. passed swallow eval as noted above. MRI brain cancelled, Dr. Izola Wallace have discussed this with daughter and she has agreed to continue supportive care as management would not change based on MRI. no sings of acute stroke on CT head   HTN. added hydralazine as needed    Code Status: DNR Family Communication: d/w patient, Charity fundraiserN. No family at the bedside (indicate person spoken with, relationship, and if by phone, the number) Disposition Plan: needs [name] at discharge    Consultants:  none  Procedures:  noen  Antibiotics:  Rocephin 03/01 --> 03/03  Ceftazidime 03/03 -->     (indicate start date, and stop date if known)  HPI/Subjective: Alert, non verbal. Does not follow commands.   Objective: Vitals:   08/30/17 2044  08/31/17 0528  BP: (!) 150/58 (!) 148/65  Pulse: 63 69  Resp: 18 17  Temp: 97.7 F (36.5 C) 98.1 F (36.7 C)  SpO2: 96% 98%    Intake/Output Summary (Last 24 hours) at 08/31/2017 1418 Last data filed at 08/31/2017 0825 Gross per 24 hour  Intake 640 ml  Output 1350 ml  Net -710 ml   Filed Weights   08/28/17 2220  Weight: 68.5 kg (151 lb)    Exam:   General:  No distress   Cardiovascular: s1,s2 rrr  Respiratory: CTA BL  Abdomen: soft, nt,   Musculoskeletal: right leg contracture    Data Reviewed: Basic Metabolic Panel: Recent Labs  Lab 08/28/17 1408 08/28/17 1417 08/29/17 0545 08/30/17 0607  NA 144 143 143 138  K 4.2 4.9 3.9 3.9  CL 107 106 109 107  CO2 27  --  26 24  GLUCOSE 112* 108* 93 99  BUN 16 19 13 11   CREATININE 1.06 1.10 0.89 0.79  CALCIUM 8.9  --  8.6* 8.5*   Liver Function Tests: Recent Labs  Lab 08/28/17 1408  AST 19  ALT 19  ALKPHOS 109  BILITOT 0.8  PROT 7.7  ALBUMIN 3.2*   Recent Labs  Lab 08/28/17 1408  LIPASE 44   No results for input(s): AMMONIA in the last 168 hours. CBC: Recent Labs  Lab 08/28/17 1408 08/28/17 1417 08/29/17 0545 08/30/17 0607  WBC 8.0  --  6.8 6.5  NEUTROABS 6.0  --   --   --   HGB 14.4 15.3 12.5* 12.6*  HCT 44.9 45.0 39.0 38.6*  MCV 87.5  --  85.5 85.2  PLT 320  --  281 329   Cardiac Enzymes: No results for input(s): CKTOTAL, CKMB, CKMBINDEX, TROPONINI in the last 168 hours. BNP (last 3 results) No results for input(s): BNP in the last 8760 hours.  ProBNP (last 3 results) No results for input(s): PROBNP in the last 8760 hours.  CBG: Recent Labs  Lab 08/28/17 1519  GLUCAP 104*    Recent Results (from the past 240 hour(s))  Blood culture (routine x 2)     Status: None (Preliminary result)   Collection Time: 08/28/17  2:09 PM  Result Value Ref Range Status   Specimen Description   Final    BLOOD RIGHT WRIST Performed at [facility], [address] [place],  [place] [address]    Special Requests   Final    BOTTLES DRAWN AEROBIC AND ANAEROBIC Blood Culture adequate volume Performed at [facility], [address], [place], [place] [address]    Culture   Final    NO GROWTH 3 DAYS Performed at [facility], [address], [place], [place] [address]    Report Status PENDING  Incomplete  Blood culture (routine x 2)     Status: Abnormal (Preliminary result)   Collection Time: 08/28/17  2:10 PM  Result Value Ref Range Status   Specimen Description   Final    BLOOD LEFT WRIST Performed at [facility], [address] [place], [place] [address]    Special Requests   Final    BOTTLES DRAWN AEROBIC AND ANAEROBIC Blood Culture adequate volume Performed at [facility], [address] [place], [place] [address]    Culture  Setup Time   Final    AEROBIC BOTTLE ONLY GRAM POSITIVE COCCI IN CLUSTERS CRITICAL RESULT CALLED TO, READ BACK BY AND VERIFIED WITH: D WOFFORD,PHARMD AT 1308 BY L BENFIELD Performed at [facility], [address], [place], [place] [address]    Culture STAPHYLOCOCCUS SPECIES (COAGULASE NEGATIVE) (A)  Final   Report Status PENDING  Incomplete  Blood Culture ID Panel (Reflexed)     Status: None   Collection Time: 08/28/17  2:10 PM  Result Value Ref Range Status   Enterococcus species NOT DETECTED NOT DETECTED Final   Listeria monocytogenes NOT DETECTED NOT DETECTED Final   Staphylococcus species NOT DETECTED NOT DETECTED Final   Staphylococcus aureus NOT DETECTED NOT DETECTED Final   Streptococcus species NOT DETECTED NOT DETECTED Final   Streptococcus agalactiae NOT DETECTED NOT DETECTED Final   Streptococcus pneumoniae NOT DETECTED NOT DETECTED Final   Streptococcus pyogenes NOT DETECTED NOT DETECTED Final   Acinetobacter baumannii NOT DETECTED NOT DETECTED Final   Enterobacteriaceae species NOT DETECTED NOT DETECTED Final   Enterobacter cloacae complex NOT  DETECTED NOT DETECTED Final   Escherichia coli NOT DETECTED NOT DETECTED Final   Klebsiella oxytoca NOT DETECTED NOT DETECTED Final   Klebsiella pneumoniae NOT DETECTED NOT DETECTED Final   Proteus species NOT DETECTED NOT DETECTED Final   Serratia marcescens NOT DETECTED NOT DETECTED Final   Haemophilus influenzae NOT DETECTED NOT DETECTED Final   Neisseria meningitidis NOT DETECTED NOT DETECTED Final   Pseudomonas aeruginosa NOT DETECTED NOT DETECTED Final   Candida albicans NOT DETECTED NOT DETECTED Final   Candida glabrata NOT DETECTED NOT DETECTED Final   Candida krusei NOT DETECTED NOT DETECTED Final   Candida parapsilosis NOT DETECTED NOT DETECTED Final   Candida tropicalis  NOT DETECTED NOT DETECTED Final    Comment: Performed at [facility], [address] [place], [place] [address]  Urine culture     Status: Abnormal   Collection Time: 08/28/17  2:18 PM  Result Value Ref Range Status   Specimen Description   Final    URINE, RANDOM Performed at [facility], [address], [place], [place] [address]    Special Requests   Final    NONE Performed at [facility], [address] [place], [place] [address]    Culture (A)  Final    >=100,000 COLONIES/mL PSEUDOMONAS AERUGINOSA >=100,000 COLONIES/mL ENTEROCOCCUS FAECALIS    Report Status 08/31/2017 FINAL  Final   Organism ID, Bacteria PSEUDOMONAS AERUGINOSA (A)  Final   Organism ID, Bacteria ENTEROCOCCUS FAECALIS (A)  Final      Susceptibility   Enterococcus faecalis - MIC*    AMPICILLIN <=2 SENSITIVE Sensitive     LEVOFLOXACIN 0.5 SENSITIVE Sensitive     NITROFURANTOIN <=16 SENSITIVE Sensitive     VANCOMYCIN 1 SENSITIVE Sensitive     * >=100,000 COLONIES/mL ENTEROCOCCUS FAECALIS   Pseudomonas aeruginosa - MIC*    CEFTAZIDIME 4 SENSITIVE Sensitive     CIPROFLOXACIN <=0.25 SENSITIVE Sensitive     GENTAMICIN <=1 SENSITIVE Sensitive     IMIPENEM 2 SENSITIVE Sensitive      PIP/TAZO 8 SENSITIVE Sensitive     CEFEPIME 2 SENSITIVE Sensitive     * >=100,000 COLONIES/mL PSEUDOMONAS AERUGINOSA  MRSA PCR Screening     Status: Abnormal   Collection Time: 08/29/17  5:14 AM  Result Value Ref Range Status   MRSA by PCR POSITIVE (A) NEGATIVE Final    Comment:        The GeneXpert MRSA Assay (FDA approved for NASAL specimens only), is one component of a comprehensive MRSA colonization surveillance program. It is not intended to diagnose MRSA infection nor to guide or monitor treatment for MRSA infections. CRITICAL RESULT CALLED TO, READ BACK BY AND VERIFIED WITH: [facility] RN 08/29/2017 1107 JR Performed at [facility], [address] [place], [place] [address]      Studies: No results found.  Scheduled Meds: . aspirin EC  81 mg Oral Daily  . chlorhexidine  15 mL Mouth Rinse BID  . divalproex  125 mg Oral Daily   And  . divalproex  250 mg Oral QHS  . doxazosin  4 mg Oral Daily  . enoxaparin (LOVENOX) injection  40 mg Subcutaneous Q24H  . mouth rinse  15 mL Mouth Rinse q12n4p  . mupirocin ointment   Nasal BID  . pantoprazole  40 mg Oral Daily  . psyllium  1 packet Oral Daily  . rOPINIRole  1 mg Oral QHS   Continuous Infusions: . ampicillin (OMNIPEN) IV    . cefTAZidime (FORTAZ)  IV Stopped (08/31/17 2956)    Principal Problem:   Complicated UTI (urinary tract infection) Active Problems:   Alzheimer's dementia   Prolonged Q-T interval on ECG   Dysphagia   Right sided weakness    Time spent: >35 minutes     Esperanza Sheets  [facility] Pager [phone]. If 7PM-7AM, please contact night-coverage at [email or web address], password [facility] 08/31/2017, 2:18 PM  LOS: 3 days

## 2017-09-01 LAB — CULTURE, BLOOD (ROUTINE X 2): SPECIAL REQUESTS: ADEQUATE

## 2017-09-01 MED ORDER — LEVOFLOXACIN IN D5W 500 MG/100ML IV SOLN
500.0000 mg | INTRAVENOUS | Status: DC
  Administered 2017-09-01: 500 mg via INTRAVENOUS
  Filled 2017-09-01 (×2): qty 100

## 2017-09-01 NOTE — Progress Notes (Signed)
[facility] PROGRESS NOTE  Travis Wallace ZOX:[id number] DOB: Jul 16, 1930 DOA: 08/28/2017 PCP: System, Pcp Not In  Brief summary   84 yo male with severe Alzheimer severe dementia, non verbal, HTN, CAD h/o CABG, OSA, presented with acute change in mental status. Per record review, pt is non verbal at baseline and is wheel chair bound. Family also reports noticing more lethargy, pt now able to swallow pills, right sided weakness.   Assessment/Plan:  Acute metabolic encephalopathy on top of severe dementia. due to acute UTI imposed on already known and severe Alzheimer's disease. passed swallow eval, dys I diet recommended, pt so far tolerating well, pt does need assistance with feeding CT head: no acute infarcts. Cont to treat UTI. Monitor   UTI, related to urinary cath. urine cultures pseudomonas and enterococcus. Received Ceftazidime, added ampicillin. Blood cultures 1/2 coag neg -likely contaminant. pend repeat blood cultures. d/w Dr. Ninetta Lights who recommended to transition to levofloxacin for better coverage for two infections. Will change to PO in 24 hrs hrs   Alzheimer's dementia. continue to provide supportive care, treat UTI as noted abvoe   Prolonged Q-T interval on ECG. Resolved   Dysphagia, Right sided weakness. passed swallow eval as noted above. MRI brain cancelled, Dr. Izola Price have discussed this with daughter and she has agreed to continue supportive care as management would not change based on MRI. no sings of acute stroke on CT head   HTN. added hydralazine as needed    Code Status: DNR Family Communication: d/w patient, Charity fundraiser. No family at the bedside. Called POA no answer. Will try later  (indicate person spoken with, relationship, and if by phone, the number) Disposition Plan: needs [name] at discharge    Consultants:  none  Procedures:  noen  Antibiotics:  Rocephin 03/01 --> 03/03  Ceftazidime 03/03 -->     (indicate start date, and stop date if  known)  HPI/Subjective: Alert, non verbal. Does not follow commands.   Objective: Vitals:   08/31/17 2211 09/01/17 0445  BP: (!) 162/68 (!) 157/96  Pulse: 73 72  Resp: 18 18  Temp: 97.9 F (36.6 C) 99.1 F (37.3 C)  SpO2: 96% 98%    Intake/Output Summary (Last 24 hours) at 09/01/2017 1133 Last data filed at 09/01/2017 0810 Gross per 24 hour  Intake 600 ml  Output 1400 ml  Net -800 ml   Filed Weights   08/28/17 2220  Weight: 68.5 kg (151 lb)    Exam:   General:  No distress   Cardiovascular: s1,s2 rrr  Respiratory: CTA BL  Abdomen: soft, nt,   Musculoskeletal: right leg contracture    Data Reviewed: Basic Metabolic Panel: Recent Labs  Lab 08/28/17 1408 08/28/17 1417 08/29/17 0545 08/30/17 0607  NA 144 143 143 138  K 4.2 4.9 3.9 3.9  CL 107 106 109 107  CO2 27  --  26 24  GLUCOSE 112* 108* 93 99  BUN 16 19 13 11   CREATININE 1.06 1.10 0.89 0.79  CALCIUM 8.9  --  8.6* 8.5*   Liver Function Tests: Recent Labs  Lab 08/28/17 1408  AST 19  ALT 19  ALKPHOS 109  BILITOT 0.8  PROT 7.7  ALBUMIN 3.2*   Recent Labs  Lab 08/28/17 1408  LIPASE 44   No results for input(s): AMMONIA in the last 168 hours. CBC: Recent Labs  Lab 08/28/17 1408 08/28/17 1417 08/29/17 0545 08/30/17 0607  WBC 8.0  --  6.8 6.5  NEUTROABS 6.0  --   --   --  HGB 14.4 15.3 12.5* 12.6*  HCT 44.9 45.0 39.0 38.6*  MCV 87.5  --  85.5 85.2  PLT 320  --  281 329   Cardiac Enzymes: No results for input(s): CKTOTAL, CKMB, CKMBINDEX, TROPONINI in the last 168 hours. BNP (last 3 results) No results for input(s): BNP in the last 8760 hours.  ProBNP (last 3 results) No results for input(s): PROBNP in the last 8760 hours.  CBG: Recent Labs  Lab 08/28/17 1519  GLUCAP 104*    Recent Results (from the past 240 hour(s))  Blood culture (routine x 2)     Status: None (Preliminary result)   Collection Time: 08/28/17  2:09 PM  Result Value Ref Range Status   Specimen  Description   Final    BLOOD RIGHT WRIST Performed at [facility], [address] [place], [place] [address]    Special Requests   Final    BOTTLES DRAWN AEROBIC AND ANAEROBIC Blood Culture adequate volume Performed at [facility], [address] [place], [place] [address]    Culture   Final    NO GROWTH 3 DAYS Performed at [facility], [address], [place], [place] [address]    Report Status PENDING  Incomplete  Blood culture (routine x 2)     Status: Abnormal   Collection Time: 08/28/17  2:10 PM  Result Value Ref Range Status   Specimen Description   Final    BLOOD LEFT WRIST Performed at [facility], [address] [place], [place] [address]    Special Requests   Final    BOTTLES DRAWN AEROBIC AND ANAEROBIC Blood Culture adequate volume Performed at [facility], [address], [place], [place] [address]    Culture  Setup Time   Final    AEROBIC BOTTLE ONLY GRAM POSITIVE COCCI IN CLUSTERS CRITICAL RESULT CALLED TO, READ BACK BY AND VERIFIED WITH: D WOFFORD,PHARMD AT 0703 BY L BENFIELD    Culture (A)  Final    STAPHYLOCOCCUS SPECIES (COAGULASE NEGATIVE) THE SIGNIFICANCE OF ISOLATING THIS ORGANISM FROM A SINGLE SET OF BLOOD CULTURES WHEN MULTIPLE SETS ARE DRAWN IS UNCERTAIN. PLEASE NOTIFY THE MICROBIOLOGY DEPARTMENT WITHIN ONE WEEK IF SPECIATION AND SENSITIVITIES ARE REQUIRED. Performed at [facility], [address] [place], [place] [address]    Report Status 09/01/2017 FINAL  Final  Blood Culture ID Panel (Reflexed)     Status: None   Collection Time: 08/28/17  2:10 PM  Result Value Ref Range Status   Enterococcus species NOT DETECTED NOT DETECTED Final   Listeria monocytogenes NOT DETECTED NOT DETECTED Final   Staphylococcus species NOT DETECTED NOT DETECTED Final   Staphylococcus aureus NOT DETECTED NOT DETECTED Final   Streptococcus species NOT DETECTED NOT DETECTED  Final   Streptococcus agalactiae NOT DETECTED NOT DETECTED Final   Streptococcus pneumoniae NOT DETECTED NOT DETECTED Final   Streptococcus pyogenes NOT DETECTED NOT DETECTED Final   Acinetobacter baumannii NOT DETECTED NOT DETECTED Final   Enterobacteriaceae species NOT DETECTED NOT DETECTED Final   Enterobacter cloacae complex NOT DETECTED NOT DETECTED Final   Escherichia coli NOT DETECTED NOT DETECTED Final   Klebsiella oxytoca NOT DETECTED NOT DETECTED Final   Klebsiella pneumoniae NOT DETECTED NOT DETECTED Final   Proteus species NOT DETECTED NOT DETECTED Final   Serratia marcescens NOT DETECTED NOT DETECTED Final   Haemophilus influenzae NOT DETECTED NOT DETECTED Final   Neisseria meningitidis NOT DETECTED NOT DETECTED Final  Pseudomonas aeruginosa NOT DETECTED NOT DETECTED Final   Candida albicans NOT DETECTED NOT DETECTED Final   Candida glabrata NOT DETECTED NOT DETECTED Final   Candida krusei NOT DETECTED NOT DETECTED Final   Candida parapsilosis NOT DETECTED NOT DETECTED Final   Candida tropicalis NOT DETECTED NOT DETECTED Final    Comment: Performed at [facility], [address], [place], [place] [address]  Urine culture     Status: Abnormal   Collection Time: 08/28/17  2:18 PM  Result Value Ref Range Status   Specimen Description   Final    URINE, RANDOM Performed at [facility], [address], [place], [place] [address]    Special Requests   Final    NONE Performed at [facility], [address], [place], [place] [address]    Culture (A)  Final    >=100,000 COLONIES/mL PSEUDOMONAS AERUGINOSA >=100,000 COLONIES/mL ENTEROCOCCUS FAECALIS    Report Status 08/31/2017 FINAL  Final   Organism ID, Bacteria PSEUDOMONAS AERUGINOSA (A)  Final   Organism ID, Bacteria ENTEROCOCCUS FAECALIS (A)  Final      Susceptibility   Enterococcus faecalis - MIC*    AMPICILLIN <=2 SENSITIVE Sensitive     LEVOFLOXACIN 0.5 SENSITIVE  Sensitive     NITROFURANTOIN <=16 SENSITIVE Sensitive     VANCOMYCIN 1 SENSITIVE Sensitive     * >=100,000 COLONIES/mL ENTEROCOCCUS FAECALIS   Pseudomonas aeruginosa - MIC*    CEFTAZIDIME 4 SENSITIVE Sensitive     CIPROFLOXACIN <=0.25 SENSITIVE Sensitive     GENTAMICIN <=1 SENSITIVE Sensitive     IMIPENEM 2 SENSITIVE Sensitive     PIP/TAZO 8 SENSITIVE Sensitive     CEFEPIME 2 SENSITIVE Sensitive     * >=100,000 COLONIES/mL PSEUDOMONAS AERUGINOSA  MRSA PCR Screening     Status: Abnormal   Collection Time: 08/29/17  5:14 AM  Result Value Ref Range Status   MRSA by PCR POSITIVE (A) NEGATIVE Final    Comment:        The GeneXpert MRSA Assay (FDA approved for NASAL specimens only), is one component of a comprehensive MRSA colonization surveillance program. It is not intended to diagnose MRSA infection nor to guide or monitor treatment for MRSA infections. CRITICAL RESULT CALLED TO, READ BACK BY AND VERIFIED WITH: [facility] RN 08/29/2017 1107 JR Performed at [facility], [address], [place], [place] [address]      Studies: No results found.  Scheduled Meds: . aspirin EC  81 mg Oral Daily  . chlorhexidine  15 mL Mouth Rinse BID  . divalproex  125 mg Oral Daily   And  . divalproex  250 mg Oral QHS  . doxazosin  4 mg Oral Daily  . enoxaparin (LOVENOX) injection  40 mg Subcutaneous Q24H  . mouth rinse  15 mL Mouth Rinse q12n4p  . mupirocin ointment   Nasal BID  . pantoprazole  40 mg Oral Daily  . psyllium  1 packet Oral Daily  . rOPINIRole  1 mg Oral QHS   Continuous Infusions: . ampicillin (OMNIPEN) IV 1 g (09/01/17 0554)  . cefTAZidime (FORTAZ)  IV Stopped (09/01/17 5284)    Principal Problem:   Complicated UTI (urinary tract infection) Active Problems:   Alzheimer's dementia   Prolonged Q-T interval on ECG   Dysphagia   Right sided weakness    Time spent: >35 minutes     Esperanza Sheets  [facility] Pager [phone]. If  7PM-7AM, please contact night-coverage  at [email or web address], password [email or web address] 09/01/2017, 11:33 AM  LOS: 4 days

## 2017-09-01 NOTE — Progress Notes (Signed)
Pharmacy Antibiotic Note  Travis MoloneyJohn Wallace is a 84 y.o. male admitted on 08/28/2017 with UTI.  Pharmacy has been consulted for ceftazidime dosing. Now changing to Levaquin per Rx on 3/5  Plan: 1) Levaquin 500mg  IV q24 2) Noted plan in Md note to change to PO Levaquin in 24hr  Height: 5\' 4"  (162.6 cm) Weight: 151 lb (68.5 kg) IBW/kg (Calculated) : 59.2  Temp (24hrs), Avg:98.4 F (36.9 C), Min:97.9 F (36.6 C), Max:99.1 F (37.3 C)  Recent Labs  Lab 08/28/17 1408 08/28/17 1417 08/29/17 0545 08/30/17 0607  WBC 8.0  --  6.8 6.5  CREATININE 1.06 1.10 0.89 0.79  LATICACIDVEN  --  1.05  --   --     Estimated Creatinine Clearance: 57.6 mL/min (by C-G formula based on SCr of 0.79 mg/dL).    Allergies  Allergen Reactions  . Memantine Other (See Comments)    Reaction:  Bradycardia  Other reaction(s): Other (See Comments) Other reaction(s): Other (See Comments) Reaction:Bradycardia  Reaction:  Bradycardia       Thank you for allowing pharmacy to be a part of this patient's care.   Hessie KnowsJustin M Shirin Echeverry, PharmD, BCPS Pager [phone] 09/01/2017 11:49 AM

## 2017-09-01 NOTE — Progress Notes (Signed)
PT Cancellation Note  Patient Details Name: Travis MoloneyJohn Wallace MRN: [id number] DOB: 10/30/1930   Cancelled Treatment:    Reason Eval/Treat Not Completed: PT screened, no needs identified, will sign off.  Attempted mobility assessment, unfortunate to find right Leg severely flexed to point that his foot is against his peri area. Patient moans and shakes and resists with attempt to stretch the leg which appears to be painful. The  Patient is nonverbal. No skilled PT needs identified. Patient is a long term resident of [name] and requires total care.       Rada HayHill, Travis Wallace Travis Wallace 09/01/2017, 10:55 AM Travis KelchKaren Crit Wallace PT [phone]

## 2017-09-02 LAB — CULTURE, BLOOD (ROUTINE X 2)
Culture: NO GROWTH
Special Requests: ADEQUATE

## 2017-09-02 MED ORDER — VANCOMYCIN HCL IN DEXTROSE 1-5 GM/200ML-% IV SOLN
1000.0000 mg | INTRAVENOUS | Status: DC
  Administered 2017-09-03: 1000 mg via INTRAVENOUS
  Filled 2017-09-02: qty 200

## 2017-09-02 MED ORDER — ASPIRIN 81 MG PO CHEW
81.0000 mg | CHEWABLE_TABLET | Freq: Every day | ORAL | Status: DC
  Administered 2017-09-02 – 2017-09-05 (×4): 81 mg via ORAL
  Filled 2017-09-02 (×4): qty 1

## 2017-09-02 MED ORDER — PANTOPRAZOLE SODIUM 40 MG PO PACK
40.0000 mg | PACK | Freq: Every day | ORAL | Status: DC
  Administered 2017-09-02 – 2017-09-05 (×4): 40 mg
  Filled 2017-09-02 (×4): qty 20

## 2017-09-02 MED ORDER — VANCOMYCIN HCL 10 G IV SOLR
1250.0000 mg | Freq: Once | INTRAVENOUS | Status: AC
  Administered 2017-09-02: 1250 mg via INTRAVENOUS
  Filled 2017-09-02: qty 1250

## 2017-09-02 MED ORDER — CEFEPIME HCL 1 G IJ SOLR
1.0000 g | Freq: Two times a day (BID) | INTRAMUSCULAR | Status: DC
  Administered 2017-09-02 – 2017-09-04 (×5): 1 g via INTRAVENOUS
  Filled 2017-09-02 (×6): qty 1

## 2017-09-02 NOTE — Progress Notes (Signed)
[facility] PROGRESS NOTE  Travis Wallace XLK:[id number] DOB: 02/21/1931 DOA: 08/28/2017 PCP: System, Pcp Not In  Brief summary   84 yo male with severe Alzheimer severe dementia, non verbal, HTN, CAD h/o CABG, OSA, presented with acute change in mental status. Per record review, pt is non verbal at baseline and is wheel chair bound. Family also reports noticing more lethargy, pt now able to swallow pills, right sided weakness.   Assessment/Plan:  Acute metabolic encephalopathy on top of severe dementia. due to acute UTI imposed on already known and severe Alzheimer's disease. passed swallow eval, dys I diet recommended, pt so far tolerating well, pt does need assistance with feeding CT head: no acute infarcts. Cont to treat UTI. Monitor   UTI, related to urinary cath. urine cultures pseudomonas and enterococcus. Received Ceftazidime, + ampicillin. Blood cultures 1/2 coag neg -likely contaminant. pend repeat blood cultures. Then transitioned to levofloxacin on 3/6 per phone discussion with Dr. Ninetta Lights. however he developed fever. Will cont iv antibiotic treatment for 24-48 hrs. Then try to transition to oral again if no fevers  -urinary retention. Cont foley for 7-10 days, then voiding trial. Cont cardura.   Alzheimer's dementia. continue to provide supportive care, treat UTI as noted abvoe   Prolonged Q-T interval on ECG. Resolved   Dysphagia, Right sided weakness. passed swallow eval as noted above. MRI brain cancelled, Dr. Izola Price have discussed this with daughter and she has agreed to continue supportive care as management would not change based on MRI. no sings of acute stroke on CT head   HTN. added hydralazine as needed   Prognosis is guarded with severe likely end stage dementia, bed bound, contracture, infections, and related complications. Will ask palliative car consultation for GOC/hospice   Code Status: DNR Family Communication: d/w patient, RN. No family at the bedside.  Called POA no answer. Will try later  (indicate person spoken with, relationship, and if by phone, the number) Disposition Plan: needs [name] at discharge    Consultants:  none  Procedures:  noen  Antibiotics:  Rocephin 03/01 --> 03/03  Ceftazidime 03/03 -->     (indicate start date, and stop date if known)  HPI/Subjective: Alert, non verbal. Does not follow commands.   Objective: Vitals:   09/02/17 0415 09/02/17 0846  BP: (!) 142/56   Pulse: 93   Resp: 18   Temp: (!) 101 F (38.3 C) 98.4 F (36.9 C)  SpO2: 96%     Intake/Output Summary (Last 24 hours) at 09/02/2017 1044 Last data filed at 09/02/2017 0802 Gross per 24 hour  Intake 340 ml  Output 775 ml  Net -435 ml   Filed Weights   08/28/17 2220  Weight: 68.5 kg (151 lb)    Exam:   General:  No distress   Cardiovascular: s1,s2 rrr  Respiratory: CTA BL  Abdomen: soft, nt,   Musculoskeletal: right leg contracture    Data Reviewed: Basic Metabolic Panel: Recent Labs  Lab 08/28/17 1408 08/28/17 1417 08/29/17 0545 08/30/17 0607  NA 144 143 143 138  K 4.2 4.9 3.9 3.9  CL 107 106 109 107  CO2 27  --  26 24  GLUCOSE 112* 108* 93 99  BUN 16 19 13 11   CREATININE 1.06 1.10 0.89 0.79  CALCIUM 8.9  --  8.6* 8.5*   Liver Function Tests: Recent Labs  Lab 08/28/17 1408  AST 19  ALT 19  ALKPHOS 109  BILITOT 0.8  PROT 7.7  ALBUMIN 3.2*   Recent  Labs  Lab 08/28/17 1408  LIPASE 44   No results for input(s): AMMONIA in the last 168 hours. CBC: Recent Labs  Lab 08/28/17 1408 08/28/17 1417 08/29/17 0545 08/30/17 0607  WBC 8.0  --  6.8 6.5  NEUTROABS 6.0  --   --   --   HGB 14.4 15.3 12.5* 12.6*  HCT 44.9 45.0 39.0 38.6*  MCV 87.5  --  85.5 85.2  PLT 320  --  281 329   Cardiac Enzymes: No results for input(s): CKTOTAL, CKMB, CKMBINDEX, TROPONINI in the last 168 hours. BNP (last 3 results) No results for input(s): BNP in the last 8760 hours.  ProBNP (last 3 results) No results for  input(s): PROBNP in the last 8760 hours.  CBG: Recent Labs  Lab 08/28/17 1519  GLUCAP 104*    Recent Results (from the past 240 hour(s))  Blood culture (routine x 2)     Status: None (Preliminary result)   Collection Time: 08/28/17  2:09 PM  Result Value Ref Range Status   Specimen Description   Final    BLOOD RIGHT WRIST Performed at [facility], [address], [place], [place] [address]    Special Requests   Final    BOTTLES DRAWN AEROBIC AND ANAEROBIC Blood Culture adequate volume Performed at [facility], [address] [place], [place] [address]    Culture   Final    NO GROWTH 4 DAYS Performed at [facility], [address], [place], [place] [address]    Report Status PENDING  Incomplete  Blood culture (routine x 2)     Status: Abnormal   Collection Time: 08/28/17  2:10 PM  Result Value Ref Range Status   Specimen Description   Final    BLOOD LEFT WRIST Performed at [facility], [address], [place], [place] [address]    Special Requests   Final    BOTTLES DRAWN AEROBIC AND ANAEROBIC Blood Culture adequate volume Performed at [facility], [address] [place], [place] [address]    Culture  Setup Time   Final    AEROBIC BOTTLE ONLY GRAM POSITIVE COCCI IN CLUSTERS CRITICAL RESULT CALLED TO, READ BACK BY AND VERIFIED WITH: D WOFFORD,PHARMD AT 0703 BY L BENFIELD    Culture (A)  Final    STAPHYLOCOCCUS SPECIES (COAGULASE NEGATIVE) THE SIGNIFICANCE OF ISOLATING THIS ORGANISM FROM A SINGLE SET OF BLOOD CULTURES WHEN MULTIPLE SETS ARE DRAWN IS UNCERTAIN. PLEASE NOTIFY THE MICROBIOLOGY DEPARTMENT WITHIN ONE WEEK IF SPECIATION AND SENSITIVITIES ARE REQUIRED. Performed at [facility], [address] [place], [place] [address]    Report Status 09/01/2017 FINAL  Final  Blood Culture ID Panel (Reflexed)     Status: None   Collection Time: 08/28/17  2:10 PM  Result Value Ref  Range Status   Enterococcus species NOT DETECTED NOT DETECTED Final   Listeria monocytogenes NOT DETECTED NOT DETECTED Final   Staphylococcus species NOT DETECTED NOT DETECTED Final   Staphylococcus aureus NOT DETECTED NOT DETECTED Final   Streptococcus species NOT DETECTED NOT DETECTED Final   Streptococcus agalactiae NOT DETECTED NOT DETECTED Final   Streptococcus pneumoniae NOT DETECTED NOT DETECTED Final   Streptococcus pyogenes NOT DETECTED NOT DETECTED Final   Acinetobacter baumannii NOT DETECTED NOT DETECTED Final   Enterobacteriaceae species NOT DETECTED NOT DETECTED Final   Enterobacter cloacae complex NOT DETECTED NOT DETECTED Final   Escherichia coli NOT DETECTED NOT DETECTED Final  Klebsiella oxytoca NOT DETECTED NOT DETECTED Final   Klebsiella pneumoniae NOT DETECTED NOT DETECTED Final   Proteus species NOT DETECTED NOT DETECTED Final   Serratia marcescens NOT DETECTED NOT DETECTED Final   Haemophilus influenzae NOT DETECTED NOT DETECTED Final   Neisseria meningitidis NOT DETECTED NOT DETECTED Final   Pseudomonas aeruginosa NOT DETECTED NOT DETECTED Final   Candida albicans NOT DETECTED NOT DETECTED Final   Candida glabrata NOT DETECTED NOT DETECTED Final   Candida krusei NOT DETECTED NOT DETECTED Final   Candida parapsilosis NOT DETECTED NOT DETECTED Final   Candida tropicalis NOT DETECTED NOT DETECTED Final    Comment: Performed at [facility], [address] [place], [place] [address]  Urine culture     Status: Abnormal   Collection Time: 08/28/17  2:18 PM  Result Value Ref Range Status   Specimen Description   Final    URINE, RANDOM Performed at [facility], [address] [place], [place] [address]    Special Requests   Final    NONE Performed at [facility], [address] [place], [place] [address]    Culture (A)  Final    >=100,000 COLONIES/mL PSEUDOMONAS AERUGINOSA >=100,000 COLONIES/mL ENTEROCOCCUS  FAECALIS    Report Status 08/31/2017 FINAL  Final   Organism ID, Bacteria PSEUDOMONAS AERUGINOSA (A)  Final   Organism ID, Bacteria ENTEROCOCCUS FAECALIS (A)  Final      Susceptibility   Enterococcus faecalis - MIC*    AMPICILLIN <=2 SENSITIVE Sensitive     LEVOFLOXACIN 0.5 SENSITIVE Sensitive     NITROFURANTOIN <=16 SENSITIVE Sensitive     VANCOMYCIN 1 SENSITIVE Sensitive     * >=100,000 COLONIES/mL ENTEROCOCCUS FAECALIS   Pseudomonas aeruginosa - MIC*    CEFTAZIDIME 4 SENSITIVE Sensitive     CIPROFLOXACIN <=0.25 SENSITIVE Sensitive     GENTAMICIN <=1 SENSITIVE Sensitive     IMIPENEM 2 SENSITIVE Sensitive     PIP/TAZO 8 SENSITIVE Sensitive     CEFEPIME 2 SENSITIVE Sensitive     * >=100,000 COLONIES/mL PSEUDOMONAS AERUGINOSA  MRSA PCR Screening     Status: Abnormal   Collection Time: 08/29/17  5:14 AM  Result Value Ref Range Status   MRSA by PCR POSITIVE (A) NEGATIVE Final    Comment:        The GeneXpert MRSA Assay (FDA approved for NASAL specimens only), is one component of a comprehensive MRSA colonization surveillance program. It is not intended to diagnose MRSA infection nor to guide or monitor treatment for MRSA infections. CRITICAL RESULT CALLED TO, READ BACK BY AND VERIFIED WITH: [facility] RN 08/29/2017 1107 JR Performed at [facility], [address] [place], [place] [address]   Culture, blood (routine x 2)     Status: None (Preliminary result)   Collection Time: 08/31/17  2:38 PM  Result Value Ref Range Status   Specimen Description   Final    BLOOD LEFT ARM Performed at [facility], [address] [place], [place] [address]    Special Requests   Final    BAA BCAV Performed at [facility], [address] [place], [place] [address]    Culture   Final    NO GROWTH < 24 HOURS Performed at [facility], [address] [place], [place] [address]    Report Status PENDING  Incomplete   Culture, blood (routine x 2)     Status: None (Preliminary result)  Collection Time: 08/31/17  2:40 PM  Result Value Ref Range Status   Specimen Description   Final    BLOOD RIGHT ARM Performed at [facility], [address], [place], [place] [address]    Special Requests   Final    BAA BCAV Performed at [facility], [address], [place], [place] [address]    Culture   Final    NO GROWTH < 24 HOURS Performed at [facility], [address] [place], [place] [address]    Report Status PENDING  Incomplete     Studies: No results found.  Scheduled Meds: . aspirin  81 mg Oral Daily  . chlorhexidine  15 mL Mouth Rinse BID  . divalproex  125 mg Oral Daily   And  . divalproex  250 mg Oral QHS  . doxazosin  4 mg Oral Daily  . enoxaparin (LOVENOX) injection  40 mg Subcutaneous Q24H  . mouth rinse  15 mL Mouth Rinse q12n4p  . mupirocin ointment   Nasal BID  . pantoprazole sodium  40 mg Per Tube Daily  . psyllium  1 packet Oral Daily  . rOPINIRole  1 mg Oral QHS   Continuous Infusions: . levofloxacin (LEVAQUIN) IV Stopped (09/01/17 1633)    Principal Problem:   Complicated UTI (urinary tract infection) Active Problems:   Alzheimer's dementia   Prolonged Q-T interval on ECG   Dysphagia   Right sided weakness    Time spent: >35 minutes     Esperanza Sheets  [facility] Pager [phone]. If 7PM-7AM, please contact night-coverage at [email or web address], password [email or web address] 09/02/2017, 10:44 AM  LOS: 5 days

## 2017-09-02 NOTE — Progress Notes (Signed)
Pharmacy Antibiotic Note  Travis MoloneyJohn Wallace is a 84 y.o. male admitted on 08/28/2017 with UTI.  Pharmacy originally consulted for ceftazidime dosing, was changed to Levaquin on 3/5 (MD d/w ID), abx changed to Vancomycin today 3/6 - Tmax this am to 101.   Plan: discontinue Levaquin today Cefepime 1gm q12 Vancomycin 1250mg  x1, then 1000mg  q24 Back to Levaquin when improved  Antimicrobials this admission: 3/1 Rocephin >> 3/3 3/3 ceftaz >> 3/5 3/4 ampicillin >> 3/5 3/5 Levaquin >> 3/6 3/6 Vancomycin >> 3/6 Cefepime >>  Microbiology results: 3/1 BCx: 1 of 2 coag neg Staph 3/1 UCx: pseudomonas, enterococcus 3/2 MRSA PCR: positive 3/4 BCx: ngtd  Height: 5\' 4"  (162.6 cm) Weight: 151 lb (68.5 kg) IBW/kg (Calculated) : 59.2  Temp (24hrs), Avg:99.4 F (37.4 C), Min:98.4 F (36.9 C), Max:101 F (38.3 C)  Recent Labs  Lab 08/28/17 1408 08/28/17 1417 08/29/17 0545 08/30/17 0607  WBC 8.0  --  6.8 6.5  CREATININE 1.06 1.10 0.89 0.79  LATICACIDVEN  --  1.05  --   --     Estimated Creatinine Clearance: 57.6 mL/min (by C-G formula based on SCr of 0.79 mg/dL).    Allergies  Allergen Reactions  . Memantine Other (See Comments)    Reaction:  Bradycardia  Other reaction(s): Other (See Comments) Other reaction(s): Other (See Comments) Reaction:Bradycardia  Reaction:  Bradycardia     Thank you for allowing pharmacy to be a part of this patient's care.  Otho BellowsGreen, Travis Kosmicki L PharmD Pager [phone] 09/02/2017, 11:02 AM

## 2017-09-02 NOTE — Progress Notes (Signed)
LCSW following for [name] placement.   Patient from [facility]. Per RN, attending is consulting palliative for patient.   Awaiting palliative rec.   LCSW will continue to follow for disposition.   Travis Wallace, LSCW [facility] CSW [phone]

## 2017-09-03 LAB — CBC
HEMATOCRIT: 38.8 % — AB (ref 39.0–52.0)
Hemoglobin: 12.6 g/dL — ABNORMAL LOW (ref 13.0–17.0)
MCH: 28.1 pg (ref 26.0–34.0)
MCHC: 32.5 g/dL (ref 30.0–36.0)
MCV: 86.6 fL (ref 78.0–100.0)
PLATELETS: 316 10*3/uL (ref 150–400)
RBC: 4.48 MIL/uL (ref 4.22–5.81)
RDW: 15.5 % (ref 11.5–15.5)
WBC: 6.6 10*3/uL (ref 4.0–10.5)

## 2017-09-03 LAB — BASIC METABOLIC PANEL
Anion gap: 10 (ref 5–15)
BUN: 13 mg/dL (ref 6–20)
CALCIUM: 8.3 mg/dL — AB (ref 8.9–10.3)
CO2: 25 mmol/L (ref 22–32)
Chloride: 104 mmol/L (ref 101–111)
Creatinine, Ser: 0.84 mg/dL (ref 0.61–1.24)
GFR calc Af Amer: >60 mL/min (ref >60)
GLUCOSE: 107 mg/dL — AB (ref 65–99)
Potassium: 4.4 mmol/L (ref 3.5–5.1)
Sodium: 139 mmol/L (ref 135–145)

## 2017-09-03 NOTE — Progress Notes (Signed)
Patient Demographics:    Travis Wallace, is a 84 y.o. male, DOB - 25-Oct-1930, ZOX:[id number]  Admit date - 08/28/2017   Admitting Physician Clydie Braun, MD  Outpatient Primary MD for the patient is System, Pcp Not In  LOS - 6  Chief Complaint  Patient presents with  . Altered Mental Status        Subjective:    Travis Wallace today has no fevers, no emesis,  No chest pain,  Not very verbal   Assessment  & Plan :    Principal Problem:   Complicated UTI (urinary tract infection) Active Problems:   Alzheimer's dementia   Prolonged Q-T interval on ECG   Dysphagia   Right sided weakness   Brief summary   84 yo male with severe Alzheimer severe dementia, non verbal, HTN, CAD h/o CABG, OSA, presented with acute change in mental status. Per record review, pt is non verbal at baseline and is wheel chair bound. Family also reports noticing more lethargy, pt now able to swallow pills, right sided weakness.   Assessment/Plan:  1)Acute metabolic encephalopathy on top of severe dementia. due to acute UTI imposed on already known and severe Alzheimer's disease. passed swallow eval, dys I diet recommended, pt so far tolerating well, pt does need assistance with feeding CT head: no acute infarcts. Cont to treat UTI. Monitor , palliative care team to meet with patient's daughter Gae Dry for GOC meeting set for tomorrow, 09/04/17 at 2pm.    2)UTI, related to urinary cath. urine cultures pseudomonas and enterococcus. Received Ceftazidime, + ampicillin. Blood cultures 1/2 coag neg -likely contaminant. pend repeat blood cultures.  Unable to transition to levofloxacin due to concerns about QT prolongation, previously d/w Dr. Ninetta Lights.  Stop IV vancomycin continue cefepime  3)urinary retention. Cont foley Cont cardura.   4)Alzheimer's dementia. continue to provide supportive care, treat UTI as noted  abvoe  5)Dysphagia, Right sided weakness. passed swallow eval as noted above. MRI braincancelled, Dr. Izola Price have discussed this with daughter and she has agreed to continue supportive care as management would not change based on MRI. no sings of acute stroke on CT head   6)HTN.- hydralazine as needed  Prognosis is guarded with severe likely end stage dementia, bed bound, contracture, infections, and related complications. palliative care team to meet with patient's daughter Gae Dry for GOC meeting set for tomorrow, 09/04/17 at 2pm.   Code Status: DNR Family Communication:  No family at the bedside. Called POA no answer.  Disposition Plan: needs [name] at discharge    Consultants:  none  Procedures:  noen  Antibiotics:  Rocephin 03/01 -->03/03  Ceftazidime 03/03 -->  DVT Prophylaxis  :  Lovenox  Lab Results  Component Value Date   PLT 316 09/03/2017    Inpatient Medications  Scheduled Meds: . aspirin  81 mg Oral Daily  . chlorhexidine  15 mL Mouth Rinse BID  . divalproex  125 mg Oral Daily   And  . divalproex  250 mg Oral QHS  . doxazosin  4 mg Oral Daily  . enoxaparin (LOVENOX) injection  40 mg Subcutaneous Q24H  . mouth rinse  15 mL Mouth Rinse q12n4p  . mupirocin ointment   Nasal BID  . pantoprazole sodium  40  mg Per Tube Daily  . psyllium  1 packet Oral Daily  . rOPINIRole  1 mg Oral QHS   Continuous Infusions: . ceFEPime (MAXIPIME) IV Stopped (09/03/17 1046)   PRN Meds:.acetaminophen, albuterol, hydrALAZINE, iopamidol    Anti-infectives (From admission, onward)   Start     Dose/Rate Route Frequency Ordered Stop   09/03/17 1200  vancomycin (VANCOCIN) IVPB 1000 mg/200 mL premix  Status:  Discontinued     1,000 mg 200 mL/hr over 60 Minutes Intravenous Every 24 hours 09/02/17 1122 09/03/17 1831   09/02/17 1200  vancomycin (VANCOCIN) 1,250 mg in sodium chloride 0.9 % 250 mL IVPB     1,250 mg 166.7 mL/hr over 90 Minutes Intravenous  Once 09/02/17  1122 09/02/17 1447   09/02/17 1130  ceFEPIme (MAXIPIME) 1 g in sodium chloride 0.9 % 100 mL IVPB     1 g 200 mL/hr over 30 Minutes Intravenous Every 12 hours 09/02/17 1050     09/01/17 1400  levofloxacin (LEVAQUIN) IVPB 500 mg  Status:  Discontinued     500 mg 100 mL/hr over 60 Minutes Intravenous Every 24 hours 09/01/17 1151 09/02/17 1050   08/31/17 1800  ampicillin (OMNIPEN) 1 g in sodium chloride 0.9 % 100 mL IVPB  Status:  Discontinued     1 g 300 mL/hr over 20 Minutes Intravenous Every 6 hours 08/31/17 1400 09/01/17 1139   08/30/17 1600  cefTAZidime (FORTAZ) 1 g in sodium chloride 0.9 % 100 mL IVPB  Status:  Discontinued     1 g 200 mL/hr over 30 Minutes Intravenous Every 8 hours 08/30/17 1421 09/01/17 1139   08/29/17 0600  cefTRIAXone (ROCEPHIN) 2 g in sodium chloride 0.9 % 100 mL IVPB  Status:  Discontinued     2 g 200 mL/hr over 30 Minutes Intravenous  Once 08/28/17 1955 08/28/17 2008   08/28/17 2200  cefTRIAXone (ROCEPHIN) 1 g in sodium chloride 0.9 % 100 mL IVPB  Status:  Discontinued     1 g 200 mL/hr over 30 Minutes Intravenous Every 24 hours 08/28/17 2008 08/30/17 1412   08/28/17 1545  piperacillin-tazobactam (ZOSYN) IVPB 3.375 g     3.375 g 100 mL/hr over 30 Minutes Intravenous  Once 08/28/17 1542 08/28/17 1624        Objective:   Vitals:   09/02/17 1534 09/02/17 2155 09/03/17 0636 09/03/17 1514  BP: (!) 141/48 (!) 170/50 (!) 179/51 (!) 165/50  Pulse: (!) 57 68 74 64  Resp: 18   18  Temp: 98.5 F (36.9 C) 99.8 F (37.7 C) 98.8 F (37.1 C) 98.6 F (37 C)  TempSrc: Axillary Axillary Axillary Axillary  SpO2: 100% 95% 92% 94%  Weight:      Height:        Wt Readings from Last 3 Encounters:  08/28/17 68.5 kg (151 lb)  01/13/17 77.6 kg (171 lb)  12/26/16 80.3 kg (177 lb)     Intake/Output Summary (Last 24 hours) at 09/03/2017 1831 Last data filed at 09/03/2017 1731 Gross per 24 hour  Intake 600 ml  Output 650 ml  Net -50 ml     Physical  Exam  Gen:-resting comfortably HEENT:- [place].AT, No sclera icterus Neck-Supple Neck,No JVD,.  Lungs-no wheezing, diminished in bases CV- S1, S2 normal Abd-  +ve B.Sounds, Abd Soft, No tenderness,    Extremity/Skin:- No  edema,    Neuro/Psych-affect is flat, with severe cognitive deficits and generalized weakness GU-Foley catheter with clear urine   Data Review:   Micro Results  Recent Results (from the past 240 hour(s))  Blood culture (routine x 2)     Status: None   Collection Time: 08/28/17  2:09 PM  Result Value Ref Range Status   Specimen Description   Final    BLOOD RIGHT WRIST Performed at [facility], [address], [place], [place] [address]    Special Requests   Final    BOTTLES DRAWN AEROBIC AND ANAEROBIC Blood Culture adequate volume Performed at [facility], [address], [place], [place] [address]    Culture   Final    NO GROWTH 5 DAYS Performed at [facility], [address], [place], [place] [address]    Report Status 09/02/2017 FINAL  Final  Blood culture (routine x 2)     Status: Abnormal   Collection Time: 08/28/17  2:10 PM  Result Value Ref Range Status   Specimen Description   Final    BLOOD LEFT WRIST Performed at [facility], [address] [place], [place] [address]    Special Requests   Final    BOTTLES DRAWN AEROBIC AND ANAEROBIC Blood Culture adequate volume Performed at [facility], [address], [place], [place] [address]    Culture  Setup Time   Final    AEROBIC BOTTLE ONLY GRAM POSITIVE COCCI IN CLUSTERS CRITICAL RESULT CALLED TO, READ BACK BY AND VERIFIED WITH: D WOFFORD,PHARMD AT 0703 BY L BENFIELD    Culture (A)  Final    STAPHYLOCOCCUS SPECIES (COAGULASE NEGATIVE) THE SIGNIFICANCE OF ISOLATING THIS ORGANISM FROM A SINGLE SET OF BLOOD CULTURES WHEN MULTIPLE SETS ARE DRAWN IS UNCERTAIN. PLEASE NOTIFY THE MICROBIOLOGY DEPARTMENT WITHIN ONE WEEK IF  SPECIATION AND SENSITIVITIES ARE REQUIRED. Performed at [facility], [address], [place], [place] [address]    Report Status 09/01/2017 FINAL  Final  Blood Culture ID Panel (Reflexed)     Status: None   Collection Time: 08/28/17  2:10 PM  Result Value Ref Range Status   Enterococcus species NOT DETECTED NOT DETECTED Final   Listeria monocytogenes NOT DETECTED NOT DETECTED Final   Staphylococcus species NOT DETECTED NOT DETECTED Final   Staphylococcus aureus NOT DETECTED NOT DETECTED Final   Streptococcus species NOT DETECTED NOT DETECTED Final   Streptococcus agalactiae NOT DETECTED NOT DETECTED Final   Streptococcus pneumoniae NOT DETECTED NOT DETECTED Final   Streptococcus pyogenes NOT DETECTED NOT DETECTED Final   Acinetobacter baumannii NOT DETECTED NOT DETECTED Final   Enterobacteriaceae species NOT DETECTED NOT DETECTED Final   Enterobacter cloacae complex NOT DETECTED NOT DETECTED Final   Escherichia coli NOT DETECTED NOT DETECTED Final   Klebsiella oxytoca NOT DETECTED NOT DETECTED Final   Klebsiella pneumoniae NOT DETECTED NOT DETECTED Final   Proteus species NOT DETECTED NOT DETECTED Final   Serratia marcescens NOT DETECTED NOT DETECTED Final   Haemophilus influenzae NOT DETECTED NOT DETECTED Final   Neisseria meningitidis NOT DETECTED NOT DETECTED Final   Pseudomonas aeruginosa NOT DETECTED NOT DETECTED Final   Candida albicans NOT DETECTED NOT DETECTED Final   Candida glabrata NOT DETECTED NOT DETECTED Final   Candida krusei NOT DETECTED NOT DETECTED Final   Candida parapsilosis NOT DETECTED NOT DETECTED Final   Candida tropicalis NOT DETECTED NOT DETECTED Final    Comment: Performed at [facility], [address] [place], [place] [address]  Urine culture     Status: Abnormal   Collection Time: 08/28/17  2:18 PM  Result Value Ref Range Status   Specimen Description   Final    URINE, RANDOM Performed at [facility], [address] [place], [place] [address]    Special Requests   Final    NONE Performed at [facility], [address] [place], [place] [address]    Culture (A)  Final    >=100,000 COLONIES/mL PSEUDOMONAS AERUGINOSA >=100,000 COLONIES/mL ENTEROCOCCUS FAECALIS    Report Status 08/31/2017 FINAL  Final   Organism ID, Bacteria PSEUDOMONAS AERUGINOSA (A)  Final   Organism ID, Bacteria ENTEROCOCCUS FAECALIS (A)  Final      Susceptibility   Enterococcus faecalis - MIC*    AMPICILLIN <=2 SENSITIVE Sensitive     LEVOFLOXACIN 0.5 SENSITIVE Sensitive     NITROFURANTOIN <=16 SENSITIVE Sensitive     VANCOMYCIN 1 SENSITIVE Sensitive     * >=100,000 COLONIES/mL ENTEROCOCCUS FAECALIS   Pseudomonas aeruginosa - MIC*    CEFTAZIDIME 4 SENSITIVE Sensitive     CIPROFLOXACIN <=0.25 SENSITIVE Sensitive     GENTAMICIN <=1 SENSITIVE Sensitive     IMIPENEM 2 SENSITIVE Sensitive     PIP/TAZO 8 SENSITIVE Sensitive     CEFEPIME 2 SENSITIVE Sensitive     * >=100,000 COLONIES/mL PSEUDOMONAS AERUGINOSA  MRSA PCR Screening     Status: Abnormal   Collection Time: 08/29/17  5:14 AM  Result Value Ref Range Status   MRSA by PCR POSITIVE (A) NEGATIVE Final    Comment:        The GeneXpert MRSA Assay (FDA approved for NASAL specimens only), is one component of a comprehensive MRSA colonization surveillance program. It is not intended to diagnose MRSA infection nor to guide or monitor treatment for MRSA infections. CRITICAL RESULT CALLED TO, READ BACK BY AND VERIFIED WITH: [facility].WILLARD RN 08/29/2017 1107 JR Performed at [facility], [address] [place], [place] [address]   Culture, blood (routine x 2)     Status: None (Preliminary result)   Collection Time: 08/31/17  2:38 PM  Result Value Ref Range Status   Specimen Description   Final    BLOOD LEFT ARM Performed at [facility], [address] [place], [place] [address]    Special Requests   Final     BAA BCAV Performed at [facility], [address] [place], [place] [address]    Culture   Final    NO GROWTH 3 DAYS Performed at [facility], [address] [place], [place] [address]    Report Status PENDING  Incomplete  Culture, blood (routine x 2)     Status: None (Preliminary result)   Collection Time: 08/31/17  2:40 PM  Result Value Ref Range Status   Specimen Description   Final    BLOOD RIGHT ARM Performed at [facility], [address] [place], [place] [address]    Special Requests   Final    BAA BCAV Performed at [facility], [address] [place], [place] [address]    Culture   Final    NO GROWTH 3 DAYS Performed at [facility], [address] [place], [place] [address]    Report Status PENDING  Incomplete    Radiology Reports Dg Chest 2 View  Result Date: 08/28/2017 CLINICAL DATA:  UTI EXAM: CHEST  2 VIEW COMPARISON:  02/24/2017 FINDINGS: Post sternotomy changes. Low lung volumes. Mild cardiomegaly. No focal pulmonary infiltrate or effusion. Aortic atherosclerosis.  No pneumothorax. Surgical hardware in the lower spine. Surgical clips in the right upper quadrant. IMPRESSION: No active cardiopulmonary disease. Low lung volumes. Mild cardiomegaly. Electronically Signed   By: Jasmine Pang M.D.   On: 08/28/2017 15:06   Ct Head Wo Contrast  Result Date: 08/28/2017 CLINICAL DATA:  84 year old male with altered mental status. EXAM: CT HEAD WITHOUT CONTRAST TECHNIQUE: Contiguous axial images were obtained from the base of the skull through the vertex without intravenous contrast. COMPARISON:  Head CT 12/17/2016. FINDINGS: Brain: Stable cerebral volume since 2018. No midline shift, ventriculomegaly, mass effect, evidence of mass lesion, intracranial hemorrhage or evidence of cortically based acute infarction. Patchy bilateral white matter hypodensity is stable. Chronic lacunar infarcts of the  bilateral basal ganglia appear stable. No definite cortical encephalomalacia. Vascular: Calcified atherosclerosis at the skull base. No suspicious intracranial vascular hyperdensity. Skull: Stable and negative. Sinuses/Orbits: chronic sinus mucosal thickening and opacification, mildly progressed since 2018. Tympanic cavities and mastoids remain clear. Other: No acute orbit or scalp soft tissue findings. IMPRESSION: 1. No acute intracranial abnormality. Moderately advanced chronic small vessel disease appears stable since 2018. 2. Chronic paranasal sinus disease has mildly progressed. Electronically Signed   By: Odessa Fleming M.D.   On: 08/28/2017 18:02   Ct Abdomen Pelvis W Contrast  Result Date: 08/28/2017 CLINICAL DATA:  foley for UTI currently prescribed PO cipro however is not able to take pills." EXAM: CT ABDOMEN AND PELVIS WITH CONTRAST TECHNIQUE: Multidetector CT imaging of the abdomen and pelvis was performed using the standard protocol following bolus administration of intravenous contrast. CONTRAST:  ISOVUE-300 IOPAMIDOL (ISOVUE-300) INJECTION 61% COMPARISON:  12/17/2016 FINDINGS: Lower chest: Coronary calcifications. No pleural or pericardial effusion. Previous median sternotomy. Visualized lung bases clear. Hepatobiliary: Stable benign-appearing hepatic cysts in segments 2 and 8. no new liver lesion. No intrahepatic biliary ductal dilatation. Cholecystectomy clips. Prominent CBD to the ampullary as before. Pancreas: Unremarkable. No pancreatic ductal dilatation or surrounding inflammatory changes. Spleen: Normal in size without focal abnormality. Adrenals/Urinary Tract: Adrenal glands and kidneys unremarkable. No hydronephrosis. Foley catheter decompresses urinary bladder. Stomach/Bowel: Stomach is nondistended. Small bowel decompressed. Staple line near the base of the cecum. Appendix not identified. Moderate colonic fecal material without dilatation. Scattered sigmoid diverticula without adjacent  inflammatory/edematous change or abscess. Fecal distention of the rectum. Vascular/Lymphatic: Moderate aortoiliac atheromatous calcifications without aneurysm. Portal vein patent. Bilateral pelvic phleboliths. No abdominal or pelvic adenopathy localized. Reproductive: Mild prostatic enlargement with central coarse calcifications. Other: No ascites.  No free air. Musculoskeletal: Posterior surgical fixation hardware T11-L1, intact without surrounding lucency. Degenerative disc disease L5-S1. Previous median sternotomy. Right hip arthroplasty components without fracture or dislocation. No worrisome bone lesion. IMPRESSION: 1. No acute findings. 2. Sigmoid diverticula. 3. Coronary and aortoiliac   atherosclerosis (ICD10-170.0) 4. Postop changes as above. Electronically Signed   By: Corlis Leak M.D.   On: 08/28/2017 15:06     CBC Recent Labs  Lab 08/28/17 1408 08/28/17 1417 08/29/17 0545 08/30/17 0607 09/03/17 0621  WBC 8.0  --  6.8 6.5 6.6  HGB 14.4 15.3 12.5* 12.6* 12.6*  HCT 44.9 45.0 39.0 38.6* 38.8*  PLT 320  --  281 329 316  MCV 87.5  --  85.5 85.2 86.6  MCH 28.1  --  27.4 27.8 28.1  MCHC 32.1  --  32.1 32.6 32.5  RDW 16.3*  --  15.9* 15.6* 15.5  LYMPHSABS 1.3  --   --   --   --   MONOABS 0.5  --   --   --   --  EOSABS 0.2  --   --   --   --   BASOSABS 0.0  --   --   --   --     Chemistries  Recent Labs  Lab 08/28/17 1408 08/28/17 1417 08/29/17 0545 08/30/17 0607 09/03/17 0621  NA 144 143 143 138 139  K 4.2 4.9 3.9 3.9 4.4  CL 107 106 109 107 104  CO2 27  --  26 24 25   GLUCOSE 112* 108* 93 99 107*  BUN 16 19 13 11 13   CREATININE 1.06 1.10 0.89 0.79 0.84  CALCIUM 8.9  --  8.6* 8.5* 8.3*  AST 19  --   --   --   --   ALT 19  --   --   --   --   ALKPHOS 109  --   --   --   --   BILITOT 0.8  --   --   --   --    ------------------------------------------------------------------------------------------------------------------ No results for input(s): CHOL, HDL, LDLCALC,  TRIG, CHOLHDL, LDLDIRECT in the last 72 hours.  No results found for: HGBA1C ------------------------------------------------------------------------------------------------------------------ No results for input(s): TSH, T4TOTAL, T3FREE, THYROIDAB in the last 72 hours.  Invalid input(s): FREET3 ------------------------------------------------------------------------------------------------------------------ No results for input(s): VITAMINB12, FOLATE, FERRITIN, TIBC, IRON, RETICCTPCT in the last 72 hours.  Coagulation profile No results for input(s): INR, PROTIME in the last 168 hours.  No results for input(s): DDIMER in the last 72 hours.  Cardiac Enzymes No results for input(s): CKMB, TROPONINI, MYOGLOBIN in the last 168 hours.  Invalid input(s): CK ------------------------------------------------------------------------------------------------------------------ No results found for: BNP  Shon Hale M.D on 09/03/2017 at 6:31 PM  Between 7am to 7pm - Pager - [phone]  After 7pm go to [email or web address] - password [email or web address]  [facility] -  Office  [phone]   Voice Recognition Reubin Milan dictation system was used to create this note, attempts have been made to correct errors. Please contact the author with questions and/or clarifications.

## 2017-09-03 NOTE — Progress Notes (Signed)
Chart reviewed and patient assessment completed. Patient baseline dementia. No family at bedside. Spoke with daughter, [name] via telephone. GOC meeting set for tomorrow, 09/04/17 at 2pm.   NO CHARGE  Vennie HomansMegan Rodel Glaspy, FNP-C Palliative Medicine Team  Phone: [phone] Fax: [phone]

## 2017-09-04 ENCOUNTER — Other Ambulatory Visit: Payer: Self-pay

## 2017-09-04 DIAGNOSIS — F0281 Dementia in other diseases classified elsewhere with behavioral disturbance: Secondary | ICD-10-CM

## 2017-09-04 DIAGNOSIS — Z515 Encounter for palliative care: Secondary | ICD-10-CM

## 2017-09-04 DIAGNOSIS — G309 Alzheimer's disease, unspecified: Secondary | ICD-10-CM

## 2017-09-04 DIAGNOSIS — R1310 Dysphagia, unspecified: Secondary | ICD-10-CM

## 2017-09-04 DIAGNOSIS — N390 Urinary tract infection, site not specified: Secondary | ICD-10-CM

## 2017-09-04 DIAGNOSIS — R627 Adult failure to thrive: Secondary | ICD-10-CM

## 2017-09-04 LAB — BASIC METABOLIC PANEL
Anion gap: 7 (ref 5–15)
BUN: 11 mg/dL (ref 6–20)
CALCIUM: 8.1 mg/dL — AB (ref 8.9–10.3)
CO2: 24 mmol/L (ref 22–32)
CREATININE: 0.7 mg/dL (ref 0.61–1.24)
Chloride: 103 mmol/L (ref 101–111)
Glucose, Bld: 102 mg/dL — ABNORMAL HIGH (ref 65–99)
Potassium: 4 mmol/L (ref 3.5–5.1)
Sodium: 134 mmol/L — ABNORMAL LOW (ref 135–145)

## 2017-09-04 LAB — CBC
HEMATOCRIT: 35.8 % — AB (ref 39.0–52.0)
Hemoglobin: 11.6 g/dL — ABNORMAL LOW (ref 13.0–17.0)
MCH: 27.5 pg (ref 26.0–34.0)
MCHC: 32.4 g/dL (ref 30.0–36.0)
MCV: 84.8 fL (ref 78.0–100.0)
PLATELETS: 278 10*3/uL (ref 150–400)
RBC: 4.22 MIL/uL (ref 4.22–5.81)
RDW: 15.3 % (ref 11.5–15.5)
WBC: 5.7 10*3/uL (ref 4.0–10.5)

## 2017-09-04 MED ORDER — CIPROFLOXACIN HCL 250 MG PO TABS
250.0000 mg | ORAL_TABLET | Freq: Two times a day (BID) | ORAL | Status: DC
  Administered 2017-09-04 – 2017-09-05 (×2): 250 mg via ORAL
  Filled 2017-09-04 (×3): qty 1

## 2017-09-04 MED ORDER — HYDRALAZINE HCL 20 MG/ML IJ SOLN: 10.0000 mg | INTRAMUSCULAR | Status: DC | PRN
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
## 2017-09-04 NOTE — Progress Notes (Signed)
Patient Demographics:    Travis Wallace, is a 84 y.o. male, DOB - August 19, 1930, SWF:[id number]  Admit date - 08/28/2017   Admitting Physician Travis Morton, MD  Outpatient Primary MD for the patient is System, Pcp Not In  LOS - 7  Chief Complaint  Patient presents with  . Altered Mental Status        Subjective:    Travis Wallace today has no fevers, no emesis,  No chest pain,  sleepy  Assessment  & Plan :    Principal Problem:   Complicated UTI (urinary tract infection) Active Problems:   Alzheimer's dementia   Prolonged Q-T interval on ECG   Dysphagia   Right sided weakness   Brief summary   84 yo male with severe Alzheimer severe dementia, non verbal, HTN, CAD h/o CABG, OSA, presented with acute change in mental status. Per record review, pt is non verbal at baseline and is wheel chair bound. Family also reports noticing more lethargy, pt now able to swallow pills, right sided weakness.  Palliative care consult appreciated  Assessment/Plan:  1)Acute metabolic encephalopathy on top of severe dementia-  due to acute Pseudomonas and enterococcus UTI supermposed on baseline severe Alzheimer's disease. passed swallow eval, dys I diet recommended, pt so far tolerating well, pt does need assistance with feeding CT head: no acute infarcts. Cont to treat UTI. Monitor , palliative care team met with patient's daughter Travis Wallace for [name] meeting on  09/04/17 , plan is to have patient return to facility and have further hospice/palliative consult at facility   2) Pseudomonas and enterococcus UTI, related to urinary cath-  urine cultures pseudomonas and enterococcus. Received Ceftazidime, + ampicillin. Blood cultures 1/2 coag neg -likely contaminant.  repeat blood cultures negative,.    previously d/w Dr. Johnnye Wallace, recommend a quinolone.  Stopped IV vancomycin, STOP  Cefepime, switch to p.o. Cipro 250 twice daily for 5  to 7 days  3)urinary retention. Cont foley Cont cardura. ,  Add Flomax  4)Alzheimer's dementia. continue to provide supportive care, treat UTI as noted abvoe  5)Dysphagia, Right sided weakness. passed swallow eval as noted above. MRI braincancelled, Dr. Doyle Wallace have discussed this with daughter and she has agreed to continue supportive care as management would not change based on MRI. no sings of acute stroke on CT head   6)HTN.- hydralazine as needed  Prognosis is guarded with severe likely end stage dementia, bed bound, contracture, infections, and related complications. palliative care team met with patient's daughter Travis Wallace for [name] meeting on  09/04/17 , plan is to have patient return to facility and have further hospice/palliative consult at facility   Code Status: DNR Family Communication: Travis Wallace Disposition Plan:  palliative care team met with patient's daughter Travis Wallace for [place] meeting on  09/04/17 , plan is to have patient return to facility and have further hospice/palliative consult at facility   Consultants: palliative Procedures:  none  Antibiotics:  Rocephin 03/01 -->03/03  Ceftazidime 03/03 -->  DVT Prophylaxis  :  Lovenox  Lab Results  Component Value Date   PLT 278 09/04/2017    Inpatient Medications  Scheduled Meds: . aspirin  81 mg Oral Daily  . chlorhexidine  15 mL Mouth Rinse BID  . ciprofloxacin  250  mg Oral BID  . divalproex  125 mg Oral Daily   And  . divalproex  250 mg Oral QHS  . doxazosin  4 mg Oral Daily  . enoxaparin (LOVENOX) injection  40 mg Subcutaneous Q24H  . mouth rinse  15 mL Mouth Rinse q12n4p  . mupirocin ointment   Nasal BID  . pantoprazole sodium  40 mg Per Tube Daily  . psyllium  1 packet Oral Daily  . rOPINIRole  1 mg Oral QHS   Continuous Infusions:  PRN Meds:.acetaminophen, albuterol, hydrALAZINE, iopamidol    Anti-infectives (From admission, onward)   Start     Dose/Rate Route Frequency Ordered Stop   09/04/17  1630  ciprofloxacin (CIPRO) tablet 250 mg     250 mg Oral 2 times daily 09/04/17 1622     09/03/17 1200  vancomycin (VANCOCIN) IVPB 1000 mg/200 mL premix  Status:  Discontinued     1,000 mg 200 mL/hr over 60 Minutes Intravenous Every 24 hours 09/02/17 1122 09/03/17 1831   09/02/17 1200  vancomycin (VANCOCIN) 1,250 mg in sodium chloride 0.9 % 250 mL IVPB     1,250 mg 166.7 mL/hr over 90 Minutes Intravenous  Once 09/02/17 1122 09/02/17 1447   09/02/17 1130  ceFEPIme (MAXIPIME) 1 g in sodium chloride 0.9 % 100 mL IVPB  Status:  Discontinued     1 g 200 mL/hr over 30 Minutes Intravenous Every 12 hours 09/02/17 1050 09/04/17 1623   09/01/17 1400  levofloxacin (LEVAQUIN) IVPB 500 mg  Status:  Discontinued     500 mg 100 mL/hr over 60 Minutes Intravenous Every 24 hours 09/01/17 1151 09/02/17 1050   08/31/17 1800  ampicillin (OMNIPEN) 1 g in sodium chloride 0.9 % 100 mL IVPB  Status:  Discontinued     1 g 300 mL/hr over 20 Minutes Intravenous Every 6 hours 08/31/17 1400 09/01/17 1139   08/30/17 1600  cefTAZidime (FORTAZ) 1 g in sodium chloride 0.9 % 100 mL IVPB  Status:  Discontinued     1 g 200 mL/hr over 30 Minutes Intravenous Every 8 hours 08/30/17 1421 09/01/17 1139   08/29/17 0600  cefTRIAXone (ROCEPHIN) 2 g in sodium chloride 0.9 % 100 mL IVPB  Status:  Discontinued     2 g 200 mL/hr over 30 Minutes Intravenous  Once 08/28/17 1955 08/28/17 2008   08/28/17 2200  cefTRIAXone (ROCEPHIN) 1 g in sodium chloride 0.9 % 100 mL IVPB  Status:  Discontinued     1 g 200 mL/hr over 30 Minutes Intravenous Every 24 hours 08/28/17 2008 08/30/17 1412   08/28/17 1545  piperacillin-tazobactam (ZOSYN) IVPB 3.375 g     3.375 g 100 mL/hr over 30 Minutes Intravenous  Once 08/28/17 1542 08/28/17 1624        Objective:   Vitals:   09/03/17 1855 09/03/17 2130 09/04/17 0536 09/04/17 1358  BP: (!) 145/53 (!) 142/69 (!) 104/55 (!) 129/44  Pulse: 86 68 77 74  Resp:  18 20 19  Temp:  98.4 F (36.9 C) 98.1 F  (36.7 C) 100 F (37.8 C)  TempSrc:  Oral Oral Oral  SpO2:  100% 95%   Weight:      Height:        Wt Readings from Last 3 Encounters:  08/28/17 68.5 kg (151 lb)  01/13/17 77.6 kg (171 lb)  12/26/16 80.3 kg (177 lb)     Intake/Output Summary (Last 24 hours) at 09/04/2017 1624 Last data filed at 09/04/2017 1222 Gross per 24 hour    Intake 180 ml  Output 750 ml  Net -570 ml     Physical Exam  Gen:-resting comfortably HEENT:- [place].AT, No sclera icterus Neck-Supple Neck,No JVD,.  Lungs-no wheezing, diminished in bases CV- S1, S2 normal Abd-  +ve B.Sounds, Abd Soft, No tenderness,    Extremity/Skin:- No  edema,    Neuro/Psych-affect is flat, with severe cognitive deficits and generalized weakness GU-Foley catheter -to be removed with voiding trial   Data Review:   Micro Results Recent Results (from the past 240 hour(s))  Blood culture (routine x 2)     Status: None   Collection Time: 08/28/17  2:09 PM  Result Value Ref Range Status   Specimen Description   Final    BLOOD RIGHT WRIST Performed at [facility], [address] [place], [place] [address]    Special Requests   Final    BOTTLES DRAWN AEROBIC AND ANAEROBIC Blood Culture adequate volume Performed at [facility], [address] [place], [place] [address]    Culture   Final    NO GROWTH 5 DAYS Performed at [facility], [address] [place], [place] [address]    Report Status 09/02/2017 FINAL  Final  Blood culture (routine x 2)     Status: Abnormal   Collection Time: 08/28/17  2:10 PM  Result Value Ref Range Status   Specimen Description   Final    BLOOD LEFT WRIST Performed at [facility], [address] [place], [place] [address]    Special Requests   Final    BOTTLES DRAWN AEROBIC AND ANAEROBIC Blood Culture adequate volume Performed at [facility], [address] [place], [place] [address]    Culture  Setup Time    Final    AEROBIC BOTTLE ONLY GRAM POSITIVE COCCI IN CLUSTERS CRITICAL RESULT CALLED TO, READ BACK BY AND VERIFIED WITH: D WOFFORD,PHARMD AT 0703 BY L BENFIELD    Culture (A)  Final    STAPHYLOCOCCUS SPECIES (COAGULASE NEGATIVE) THE SIGNIFICANCE OF ISOLATING THIS ORGANISM FROM A SINGLE SET OF BLOOD CULTURES WHEN MULTIPLE SETS ARE DRAWN IS UNCERTAIN. PLEASE NOTIFY THE MICROBIOLOGY DEPARTMENT WITHIN ONE WEEK IF SPECIATION AND SENSITIVITIES ARE REQUIRED. Performed at [facility], [address] [place], [place] [address]    Report Status 09/01/2017 FINAL  Final  Blood Culture ID Panel (Reflexed)     Status: None   Collection Time: 08/28/17  2:10 PM  Result Value Ref Range Status   Enterococcus species NOT DETECTED NOT DETECTED Final   Listeria monocytogenes NOT DETECTED NOT DETECTED Final   Staphylococcus species NOT DETECTED NOT DETECTED Final   Staphylococcus aureus NOT DETECTED NOT DETECTED Final   Streptococcus species NOT DETECTED NOT DETECTED Final   Streptococcus agalactiae NOT DETECTED NOT DETECTED Final   Streptococcus pneumoniae NOT DETECTED NOT DETECTED Final   Streptococcus pyogenes NOT DETECTED NOT DETECTED Final   Acinetobacter baumannii NOT DETECTED NOT DETECTED Final   Enterobacteriaceae species NOT DETECTED NOT DETECTED Final   Enterobacter cloacae complex NOT DETECTED NOT DETECTED Final   Escherichia coli NOT DETECTED NOT DETECTED Final   Klebsiella oxytoca NOT DETECTED NOT DETECTED Final   Klebsiella pneumoniae NOT DETECTED NOT DETECTED Final   Proteus species NOT DETECTED NOT DETECTED Final   Serratia marcescens NOT DETECTED NOT DETECTED Final   Haemophilus influenzae NOT DETECTED NOT DETECTED Final   Neisseria meningitidis NOT DETECTED NOT DETECTED Final   Pseudomonas aeruginosa NOT DETECTED NOT   DETECTED Final   Candida albicans NOT DETECTED NOT DETECTED Final   Candida glabrata NOT DETECTED NOT DETECTED Final   Candida krusei NOT DETECTED NOT DETECTED  Final   Candida parapsilosis NOT DETECTED NOT DETECTED Final   Candida tropicalis NOT DETECTED NOT DETECTED Final    Comment: Performed at [facility], [place] [address] [place], [place] [address]  Urine culture     Status: Abnormal   Collection Time: 08/28/17  2:18 PM  Result Value Ref Range Status   Specimen Description   Final    URINE, RANDOM Performed at [facility] [address] [place], [place] [address]    Special Requests   Final    NONE Performed at [facility], [place] [address], [place], [place] [address]    Culture (A)  Final    >=100,000 COLONIES/mL PSEUDOMONAS AERUGINOSA >=100,000 COLONIES/mL ENTEROCOCCUS FAECALIS    Report Status 08/31/2017 FINAL  Final   Organism ID, Bacteria PSEUDOMONAS AERUGINOSA (A)  Final   Organism ID, Bacteria ENTEROCOCCUS FAECALIS (A)  Final      Susceptibility   Enterococcus faecalis - MIC*    AMPICILLIN <=2 SENSITIVE Sensitive     LEVOFLOXACIN 0.5 SENSITIVE Sensitive     NITROFURANTOIN <=16 SENSITIVE Sensitive     VANCOMYCIN 1 SENSITIVE Sensitive     * >=100,000 COLONIES/mL ENTEROCOCCUS FAECALIS   Pseudomonas aeruginosa - MIC*    CEFTAZIDIME 4 SENSITIVE Sensitive     CIPROFLOXACIN <=0.25 SENSITIVE Sensitive     GENTAMICIN <=1 SENSITIVE Sensitive     IMIPENEM 2 SENSITIVE Sensitive     PIP/TAZO 8 SENSITIVE Sensitive     CEFEPIME 2 SENSITIVE Sensitive     * >=100,000 COLONIES/mL PSEUDOMONAS AERUGINOSA  MRSA PCR Screening     Status: Abnormal   Collection Time: 08/29/17  5:14 AM  Result Value Ref Range Status   MRSA by PCR POSITIVE (A) NEGATIVE Final    Comment:        The GeneXpert MRSA Assay (FDA approved for NASAL specimens only), is one component of a comprehensive MRSA colonization surveillance program. It is not intended to diagnose MRSA infection nor to guide or monitor treatment for MRSA infections. CRITICAL RESULT CALLED TO, READ BACK BY AND VERIFIED WITH: [facility] RN  08/29/2017 1107 JR Performed at [facility], [place] [address] [place], [place] [address]   Culture, blood (routine x 2)     Status: None (Preliminary result)   Collection Time: 08/31/17  2:38 PM  Result Value Ref Range Status   Specimen Description   Final    BLOOD LEFT ARM Performed at [facility] [address], [place], [place] [address]    Special Requests   Final    BAA BCAV Performed at [facility] [address], [place], [place] [address]    Culture   Final    NO GROWTH 4 DAYS Performed at [facility], [place] [address] [place], [place] [address]    Report Status PENDING  Incomplete  Culture, blood (routine x 2)     Status: None (Preliminary result)   Collection Time: 08/31/17  2:40 PM  Result Value Ref Range Status   Specimen Description   Final    BLOOD RIGHT ARM Performed at [facility] [address] [place], [place] [address]    Special Requests   Final    BAA BCAV Performed at [facility]  [address], [place], [place] [address]    Culture   Final    NO GROWTH 4 DAYS Performed at [facility], [place] [address], [place], [place] [address]    Report Status PENDING  Incomplete    Radiology Reports Dg Chest 2 View  Result Date: 08/28/2017 CLINICAL DATA:  UTI EXAM: CHEST  2 VIEW COMPARISON:  02/24/2017 FINDINGS: Post sternotomy changes. Low lung volumes. Mild cardiomegaly. No focal pulmonary infiltrate or effusion. Aortic atherosclerosis. No pneumothorax. Surgical hardware in the lower spine. Surgical clips in the right upper quadrant. IMPRESSION: No active cardiopulmonary disease. Low lung volumes. Mild cardiomegaly. Electronically Signed   By: Donavan Foil M.D.   On: 08/28/2017 15:06   Ct Head Wo Contrast  Result Date: 08/28/2017 CLINICAL DATA:  84 year old male with altered mental status. EXAM: CT HEAD WITHOUT CONTRAST TECHNIQUE: Contiguous axial  images were obtained from the base of the skull through the vertex without intravenous contrast. COMPARISON:  Head CT 12/17/2016. FINDINGS: Brain: Stable cerebral volume since 2018. No midline shift, ventriculomegaly, mass effect, evidence of mass lesion, intracranial hemorrhage or evidence of cortically based acute infarction. Patchy bilateral white matter hypodensity is stable. Chronic lacunar infarcts of the bilateral basal ganglia appear stable. No definite cortical encephalomalacia. Vascular: Calcified atherosclerosis at the skull base. No suspicious intracranial vascular hyperdensity. Skull: Stable and negative. Sinuses/Orbits: chronic sinus mucosal thickening and opacification, mildly progressed since 2018. Tympanic cavities and mastoids remain clear. Other: No acute orbit or scalp soft tissue findings. IMPRESSION: 1. No acute intracranial abnormality. Moderately advanced chronic small vessel disease appears stable since 2018. 2. Chronic paranasal sinus disease has mildly progressed. Electronically Signed   By: Genevie Ann M.D.   On: 08/28/2017 18:02   Ct Abdomen Pelvis W Contrast  Result Date: 08/28/2017 CLINICAL DATA:  foley for UTI currently prescribed PO cipro however is not able to take pills." EXAM: CT ABDOMEN AND PELVIS WITH CONTRAST TECHNIQUE: Multidetector CT imaging of the abdomen and pelvis was performed using the standard protocol following bolus administration of intravenous contrast. CONTRAST:  ISOVUE-300 IOPAMIDOL (ISOVUE-300) INJECTION 61% COMPARISON:  12/17/2016 FINDINGS: Lower chest: Coronary calcifications. No pleural or pericardial effusion. Previous median sternotomy. Visualized lung bases clear. Hepatobiliary: Stable benign-appearing hepatic cysts in segments 2 and 8. no new liver lesion. No intrahepatic biliary ductal dilatation. Cholecystectomy clips. Prominent CBD to the ampullary as before. Pancreas: Unremarkable. No pancreatic ductal dilatation or surrounding inflammatory changes.  Spleen: Normal in size without focal abnormality. Adrenals/Urinary Tract: Adrenal glands and kidneys unremarkable. No hydronephrosis. Foley catheter decompresses urinary bladder. Stomach/Bowel: Stomach is nondistended. Small bowel decompressed. Staple line near the base of the cecum. Appendix not identified. Moderate colonic fecal material without dilatation. Scattered sigmoid diverticula without adjacent inflammatory/edematous change or abscess. Fecal distention of the rectum. Vascular/Lymphatic: Moderate aortoiliac atheromatous calcifications without aneurysm. Portal vein patent. Bilateral pelvic phleboliths. No abdominal or pelvic adenopathy localized. Reproductive: Mild prostatic enlargement with central coarse calcifications. Other: No ascites.  No free air. Musculoskeletal: Posterior surgical fixation hardware T11-L1, intact without surrounding lucency. Degenerative disc disease L5-S1. Previous median sternotomy. Right hip arthroplasty components without fracture or dislocation. No worrisome bone lesion. IMPRESSION: 1. No acute findings. 2. Sigmoid diverticula. 3. Coronary and aortoiliac   atherosclerosis (ICD10-170.0) 4. Postop changes as above. Electronically Signed   By: Lucrezia Europe M.D.   On: 08/28/2017 15:06     CBC Recent Labs  Lab 08/29/17 0545 08/30/17 0607 09/03/17 0621 09/04/17 0630  WBC 6.8 6.5 6.6 5.7  HGB  12.5* 12.6* 12.6* 11.6*  HCT 39.0 38.6* 38.8* 35.8*  PLT 281 329 316 278  MCV 85.5 85.2 86.6 84.8  MCH 27.4 27.8 28.1 27.5  MCHC 32.1 32.6 32.5 32.4  RDW 15.9* 15.6* 15.5 15.3    Chemistries  Recent Labs  Lab 08/29/17 0545 08/30/17 0607 09/03/17 0621 09/04/17 0630  NA 143 138 139 134*  K 3.9 3.9 4.4 4.0  CL 109 107 104 103  CO2 26 24 25 24  GLUCOSE 93 99 107* 102*  BUN 13 11 13 11  CREATININE 0.89 0.79 0.84 0.70  CALCIUM 8.6* 8.5* 8.3* 8.1*   ------------------------------------------------------------------------------------------------------------------ No  results for input(s): CHOL, HDL, LDLCALC, TRIG, CHOLHDL, LDLDIRECT in the last 72 hours.  No results found for: HGBA1C ------------------------------------------------------------------------------------------------------------------ No results for input(s): TSH, T4TOTAL, T3FREE, THYROIDAB in the last 72 hours.  Invalid input(s): FREET3 ------------------------------------------------------------------------------------------------------------------ No results for input(s): VITAMINB12, FOLATE, FERRITIN, TIBC, IRON, RETICCTPCT in the last 72 hours.  Coagulation profile No results for input(s): INR, PROTIME in the last 168 hours.  No results for input(s): DDIMER in the last 72 hours.  Cardiac Enzymes No results for input(s): CKMB, TROPONINI, MYOGLOBIN in the last 168 hours.  Invalid input(s): CK ------------------------------------------------------------------------------------------------------------------ No results found for: BNP    M.D on 09/04/2017 at 4:24 PM  Between 7am to 7pm - Pager - [phone]  After 7pm go to [email or web address] - password [email or web address]  [facility] -  Office  [phone]   Voice Recognition /Dragon dictation system was used to create this note, attempts have been made to correct errors. Please contact the author with questions and/or clarifications.    

## 2017-09-04 NOTE — Progress Notes (Signed)
LCSW following for [name] placement.   Patient from [facility].   Patient assessed on 3/4. LCSW awaiting palliative note to determine disposition.   LCSW will continue to follow.   Travis GandyBernette Nikia [place], LSCW [facility] CSW [phone]

## 2017-09-05 LAB — CULTURE, BLOOD (ROUTINE X 2)
CULTURE: NO GROWTH
Culture: NO GROWTH

## 2017-09-05 MED ORDER — TAMSULOSIN HCL 0.4 MG PO CAPS: 0.4000 mg | ORAL_CAPSULE | Freq: Every day | ORAL | 1 refills | Status: DC

## 2017-09-05 MED ORDER — LORAZEPAM 0.5 MG PO TABS: 0.5000 mg | ORAL_TABLET | Freq: Three times a day (TID) | ORAL | 0 refills | Status: AC | PRN

## 2017-09-05 MED ORDER — CIPROFLOXACIN HCL 250 MG PO TABS: 250.0000 mg | ORAL_TABLET | Freq: Two times a day (BID) | ORAL | 0 refills | Status: DC

## 2017-09-05 NOTE — Progress Notes (Addendum)
LCSW following for facility placement.  [facility].   LCSW confirmed return with facility.   LCSW faxed dc docs to facility.  LCSW notified family of transfer.   Patient will transport by EMS. RN will call EMS when patient is ready.   RN report number: [phone]  Coralyn HellingBernette Nyashia Raney, Norberta KeensLSCW [place] CSW [phone]

## 2017-09-05 NOTE — Discharge Summary (Signed)
Travis Wallace, is a 84 y.o. male  DOB 06/22/1931  MRN [id number].  Admission date:  08/28/2017  Admitting Physician  Norval Morton, MD  Discharge Date:  09/05/2017   Primary MD  System, Pcp Not In  Recommendations for primary care physician for things to follow:   1)Palliative/Hospice Consult at [name] 2) comfort care measures with limitations to treatment please see MOST form   Admission Diagnosis  Aortic atherosclerosis ([place]) [I70.0] Altered mental status, unspecified altered mental status type [R41.82] Urinary tract infection associated with indwelling urethral catheter, initial encounter ([place]) [Y04.599H, N39.0]   Discharge Diagnosis  Aortic atherosclerosis ([place]) [I70.0] Altered mental status, unspecified altered mental status type [R41.82] Urinary tract infection associated with indwelling urethral catheter, initial encounter ([place]) [F41.423T, N39.0]    Principal Problem:   Complicated UTI (urinary tract infection) Active Problems:   Alzheimer's dementia   Prolonged Q-T interval on ECG   Dysphagia   Right sided weakness   Adult failure to thrive   Palliative care by specialist   Terminal care      Past Medical History:  Diagnosis Date  . Acute gangrenous cholecystitis 12/19/2016   s/p lap cholecystectomy 12/19/16  . Acute kidney injury ([place])   . Alzheimer's dementia   . Alzheimer's dementia   . CAD (coronary artery disease) 12/17/2016  . Coronary artery disease   . Dementia 12/17/2016  . Depression   . Dyslipidemia   . GERD (gastroesophageal reflux disease)   . Hyperbilirubinemia   . Hypertension   . Insomnia   . OSA (obstructive sleep apnea) 12/17/2016  . Pericholecystic abscess 12/17/2016   s/p OR drainage 12/19/16  . Restless leg   . Severe sepsis ([place]) 12/17/2016  . Sinus bradycardia   . Sleep apnea     Past Surgical History:  Procedure Laterality Date  . BACK SURGERY     x2    . CARDIAC CATHETERIZATION    . CATARACT EXTRACTION, BILATERAL    . CHOLECYSTECTOMY N/A 12/19/2016   Procedure: LAPAROSCOPIC CHOLECYSTECTOMY WITH INTRAOPERATIVE CHOLANGIOGRAM;  Surgeon: Alphonsa Overall, MD;  Location: [facility];  Service: General;  Laterality: N/A;  . CORONARY ARTERY BYPASS GRAFT     X 3 VESSELS  . EYE SURGERY         HPI  from the history and physical done on the day of admission:      HPI: Travis Wallace is a 84 y.o. male with medical history significant of severe Alzheimer dementia with behavioral disturbance, HTN, CAD, OSA; who presents after being more altered.  Patient is nonverbal at baseline and his daughter provides history.  He is wheelchair-bound and has some difficulty with fine motor skills, but previously was able to eat a regular diet.  He reportedly had been diagnosed with a urinary tract infection in the middle of February completed a course of ciprofloxacin.  No previous history of recurrent UTIs noted.  However, in the last week the patient has had a rapid decline.  The daughter normally comes to visit him 3-4  times a week, but left for vacation and returned 2 days ago.  Over that time at some point a urinary catheter had been placed because he was unable to void.  She reports that there appeared to be some right-sided facial drooping. In the last 24hours patient was more somnolent and was not noted to be safe to swallow pills.  Staff also noting increased right weakness.  Patient's dementia is severe and he has had aggressive behaviors previously she reports as he did not tolerate physical therapy prior.    ED Course: Upon admission into the emergency department patient was seen to be afebrile pulse 46-83, respirations 13-29, and all other vital signs maintained. Labs including CBC, CMP, and troponin were unremarkable.  CT scan of the brain was negative for any acute abnormalities.  Urinalysis was positive for many bacteria, nitrites, moderate leukocytes, RBCs, and  TNTC WBCs.  Patient was given 1 dose of Zosyn empirically, 1 L normal saline IV fluids, and blood/urine cultures obtained.  [facility] called to admit.      Hospital Course:     Brief summary   84 yo male with severe Alzheimer severe dementia, non verbal, HTN, CAD h/o CABG, OSA, presented with acute change in mental status. Per record review, pt is non verbal at baseline and is wheel chair bound. Family also reports noticing more lethargy, pt now able to swallow pills, right sided weakness.  Palliative care consult appreciated  Assessment/Plan:  1)Acute Metabolic Encephalopathy superimposed on advanced dementia-  due to acute Pseudomonas and enterococcus UTI supermposed on baseline severe Alzheimer's disease. passed swallow eval, dys I diet recommended, pt so far tolerating well, pt does need assistance with feeding CT head: no acute infarcts. Cont to treat UTI. Monitor , Palliative care team met with patient's daughter [name] for [place] meeting on  09/04/17 , plan is to have patient return to facility and have further hospice/palliative consult at facility, see attached MOST Form for limitations of treatment as outlined   2) Pseudomonas and enterococcus UTI, related to urinary cath-  urine cultures pseudomonas and enterococcus. Received Ceftazidime,+ampicillin. Blood cultures 1/2 coag neg -likely contaminant.  repeat blood cultures negative,.    previously d/w Dr. Johnnye Sima, recommend a quinolone.  Stopped IV vancomycin, STOP  Cefepime, switch to p.o. Cipro 250 twice daily   3)urinary retention-patient voiding well after Foley removal, continue Cardura and Flomax   4)Alzheimer's dementia. continue to provide supportive care, very advanced dementia with significant cognitive deficits  5)Dysphagia, Right sided weakness. passed swallow eval as noted above. MRI braincancelled, Dr. Doyle Askew have discussed this with daughter and she has agreed to continue supportive care as management would not change  based on MRI. no sings of acute stroke on CT head , see attached MOST Form for limitations of treatment as outlined  Prognosis is guarded with severe likely end stage dementia, bed bound, contracture, infections, and related complications. palliative care team met with patient's daughter [name] for [place] meeting on  09/04/17 , plan is to have patient return to facility and have further hospice/palliative consult at facility, see attached MOST Form for limitations of treatment as outlined    Code Status:DNR Family Communication:[name] Disposition Plan: palliative care team met with patient's daughter [name] for [employer] meeting on  09/04/17 , plan is to have patient return to facility and have further hospice/palliative consult at facility, see attached MOST Form for limitations of treatment as outlined    Consultants: palliative Procedures:  none  Antibiotics:  Rocephin 03/01 -->03/03  Ceftazidime 03/03 --> Cipro 09/04/17   Discharge Condition:  Poor Prognosis Overall  Follow UP-palliative and hospice services at skilled nursing facility   Diet and Activity recommendation:  As advised  Discharge Instructions     Discharge Instructions    Activity as tolerated - No restrictions   Complete by:  As directed    Call MD for:   Complete by:  As directed    1)Palliative/Hospice Consult at [name] 2) comfort care measures with limitations to treatment please see MOST form   1)Palliative/Hospice Consult at [name] 2) comfort care measures with limitations to treatment please see MOST form   Call MD for:  difficulty breathing, headache or visual disturbances   Complete by:  As directed    Call MD for:  persistant dizziness or light-headedness   Complete by:  As directed    Call MD for:  persistant nausea and vomiting   Complete by:  As directed    Call MD for:  severe uncontrolled pain   Complete by:  As directed    Call MD for:  temperature >100.4   Complete by:  As directed    Diet  general   Complete by:  As directed    Discharge instructions   Complete by:  As directed    1)Palliative/Hospice Consult at [name] 2) comfort care measures with limitations to treatment please see MOST form        Discharge Medications     Allergies as of 09/05/2017      Reactions   Memantine Other (See Comments)   Reaction:  Bradycardia  Other reaction(s): Other (See Comments) Other reaction(s): Other (See Comments) Reaction:Bradycardia  Reaction:  Bradycardia       Medication List    TAKE these medications   acetaminophen 325 MG tablet Commonly known as:  TYLENOL Take 2 tablets (650 mg total) by mouth every 6 (six) hours as needed for mild pain, moderate pain or fever.   aspirin EC 81 MG tablet Take 81 mg by mouth daily.   Baclofen 5 MG Tabs Take 5 mg by mouth 3 (three) times daily.   ciprofloxacin 250 MG tablet Commonly known as:  CIPRO Take 1 tablet (250 mg total) by mouth 2 (two) times daily. What changed:  when to take this   divalproex 125 MG capsule Commonly known as:  DEPAKOTE SPRINKLE Take 125-250 mg by mouth See admin instructions. Take 125 mg once during the day for mood stabilization, and take 250 mg by mouth at bedtime for dementia   doxazosin 4 MG tablet Commonly known as:  CARDURA Take 4 mg by mouth daily.   lactulose 10 GM/15ML solution Commonly known as:  CHRONULAC Take 30 mLs by mouth daily as needed for mild constipation or moderate constipation.   LORazepam 0.5 MG tablet Commonly known as:  ATIVAN Take 1 tablet (0.5 mg total) by mouth every 8 (eight) hours as needed for anxiety, seizure or sleep.   omeprazole 20 MG capsule Commonly known as:  PRILOSEC Take 20 mg by mouth daily.   psyllium 95 % Pack Commonly known as:  HYDROCIL/METAMUCIL Take 1 packet by mouth daily.   QUEtiapine 25 MG tablet Commonly known as:  SEROQUEL Take 1 tablet (25 mg total) by mouth 2 (two) times daily. What changed:    how much to take  when to take  this  Another medication with the same name was removed. Continue taking this medication, and follow the directions you see here.   rOPINIRole  1 MG tablet Commonly known as:  REQUIP Take 1 mg by mouth at bedtime.   tamsulosin 0.4 MG Caps capsule Commonly known as:  FLOMAX Take 1 capsule (0.4 mg total) by mouth daily after supper.       Major procedures and Radiology Reports - PLEASE review detailed and final reports for all details, in brief -    Dg Chest 2 View  Result Date: 08/28/2017 CLINICAL DATA:  UTI EXAM: CHEST  2 VIEW COMPARISON:  02/24/2017 FINDINGS: Post sternotomy changes. Low lung volumes. Mild cardiomegaly. No focal pulmonary infiltrate or effusion. Aortic atherosclerosis. No pneumothorax. Surgical hardware in the lower spine. Surgical clips in the right upper quadrant. IMPRESSION: No active cardiopulmonary disease. Low lung volumes. Mild cardiomegaly. Electronically Signed   By: Donavan Foil M.D.   On: 08/28/2017 15:06   Ct Head Wo Contrast  Result Date: 08/28/2017 CLINICAL DATA:  84 year old male with altered mental status. EXAM: CT HEAD WITHOUT CONTRAST TECHNIQUE: Contiguous axial images were obtained from the base of the skull through the vertex without intravenous contrast. COMPARISON:  Head CT 12/17/2016. FINDINGS: Brain: Stable cerebral volume since 2018. No midline shift, ventriculomegaly, mass effect, evidence of mass lesion, intracranial hemorrhage or evidence of cortically based acute infarction. Patchy bilateral white matter hypodensity is stable. Chronic lacunar infarcts of the bilateral basal ganglia appear stable. No definite cortical encephalomalacia. Vascular: Calcified atherosclerosis at the skull base. No suspicious intracranial vascular hyperdensity. Skull: Stable and negative. Sinuses/Orbits: chronic sinus mucosal thickening and opacification, mildly progressed since 2018. Tympanic cavities and mastoids remain clear. Other: No acute orbit or scalp soft tissue  findings. IMPRESSION: 1. No acute intracranial abnormality. Moderately advanced chronic small vessel disease appears stable since 2018. 2. Chronic paranasal sinus disease has mildly progressed. Electronically Signed   By: Genevie Ann M.D.   On: 08/28/2017 18:02   Ct Abdomen Pelvis W Contrast  Result Date: 08/28/2017 CLINICAL DATA:  foley for UTI currently prescribed PO cipro however is not able to take pills." EXAM: CT ABDOMEN AND PELVIS WITH CONTRAST TECHNIQUE: Multidetector CT imaging of the abdomen and pelvis was performed using the standard protocol following bolus administration of intravenous contrast. CONTRAST:  ISOVUE-300 IOPAMIDOL (ISOVUE-300) INJECTION 61% COMPARISON:  12/17/2016 FINDINGS: Lower chest: Coronary calcifications. No pleural or pericardial effusion. Previous median sternotomy. Visualized lung bases clear. Hepatobiliary: Stable benign-appearing hepatic cysts in segments 2 and 8. no new liver lesion. No intrahepatic biliary ductal dilatation. Cholecystectomy clips. Prominent CBD to the ampullary as before. Pancreas: Unremarkable. No pancreatic ductal dilatation or surrounding inflammatory changes. Spleen: Normal in size without focal abnormality. Adrenals/Urinary Tract: Adrenal glands and kidneys unremarkable. No hydronephrosis. Foley catheter decompresses urinary bladder. Stomach/Bowel: Stomach is nondistended. Small bowel decompressed. Staple Wallace near the base of the cecum. Appendix not identified. Moderate colonic fecal material without dilatation. Scattered sigmoid diverticula without adjacent inflammatory/edematous change or abscess. Fecal distention of the rectum. Vascular/Lymphatic: Moderate aortoiliac atheromatous calcifications without aneurysm. Portal vein patent. Bilateral pelvic phleboliths. No abdominal or pelvic adenopathy localized. Reproductive: Mild prostatic enlargement with central coarse calcifications. Other: No ascites.  No free air. Musculoskeletal: Posterior surgical  fixation hardware T11-L1, intact without surrounding lucency. Degenerative disc disease L5-S1. Previous median sternotomy. Right hip arthroplasty components without fracture or dislocation. No worrisome bone lesion. IMPRESSION: 1. No acute findings. 2. Sigmoid diverticula. 3. Coronary and aortoiliac   atherosclerosis (ICD10-170.0) 4. Postop changes as above. Electronically Signed   By: Lucrezia Europe M.D.   On: 08/28/2017 15:06    Micro Results  Recent Results (from the past 240 hour(s))  Blood culture (routine x 2)     Status: None   Collection Time: 08/28/17  2:09 PM  Result Value Ref Range Status   Specimen Description   Final    BLOOD RIGHT WRIST Performed at [facility] [address] [place], [place] [address]    Special Requests   Final    BOTTLES DRAWN AEROBIC AND ANAEROBIC Blood Culture adequate volume Performed at [facility] [address] [place], [place] [address]    Culture   Final    NO GROWTH 5 DAYS Performed at [facility], [place] [address] [place], [place] [address]    Report Status 09/02/2017 FINAL  Final  Blood culture (routine x 2)     Status: Abnormal   Collection Time: 08/28/17  2:10 PM  Result Value Ref Range Status   Specimen Description   Final    BLOOD LEFT WRIST Performed at [facility] [address], [place], [place] [address]    Special Requests   Final    BOTTLES DRAWN AEROBIC AND ANAEROBIC Blood Culture adequate volume Performed at [facility] [address], [place], [place] [address]    Culture  Setup Time   Final    AEROBIC BOTTLE ONLY GRAM POSITIVE COCCI IN CLUSTERS CRITICAL RESULT CALLED TO, READ BACK BY AND VERIFIED WITH: D WOFFORD,PHARMD AT 0703 BY L BENFIELD    Culture (A)  Final    STAPHYLOCOCCUS SPECIES (COAGULASE NEGATIVE) THE SIGNIFICANCE OF ISOLATING THIS ORGANISM FROM A SINGLE SET OF BLOOD CULTURES WHEN MULTIPLE SETS ARE DRAWN IS UNCERTAIN.  PLEASE NOTIFY THE MICROBIOLOGY DEPARTMENT WITHIN ONE WEEK IF SPECIATION AND SENSITIVITIES ARE REQUIRED. Performed at [facility], [place] [address], [place], [place] [address]    Report Status 09/01/2017 FINAL  Final  Blood Culture ID Panel (Reflexed)     Status: None   Collection Time: 08/28/17  2:10 PM  Result Value Ref Range Status   Enterococcus species NOT DETECTED NOT DETECTED Final   Listeria monocytogenes NOT DETECTED NOT DETECTED Final   Staphylococcus species NOT DETECTED NOT DETECTED Final   Staphylococcus aureus NOT DETECTED NOT DETECTED Final   Streptococcus species NOT DETECTED NOT DETECTED Final   Streptococcus agalactiae NOT DETECTED NOT DETECTED Final   Streptococcus pneumoniae NOT DETECTED NOT DETECTED Final   Streptococcus pyogenes NOT DETECTED NOT DETECTED Final   Acinetobacter baumannii NOT DETECTED NOT DETECTED Final   Enterobacteriaceae species NOT DETECTED NOT DETECTED Final   Enterobacter cloacae complex NOT DETECTED NOT DETECTED Final   Escherichia coli NOT DETECTED NOT DETECTED Final   Klebsiella oxytoca NOT DETECTED NOT DETECTED Final   Klebsiella pneumoniae NOT DETECTED NOT DETECTED Final   Proteus species NOT DETECTED NOT DETECTED Final   Serratia marcescens NOT DETECTED NOT DETECTED Final   Haemophilus influenzae NOT DETECTED NOT DETECTED Final   Neisseria meningitidis NOT DETECTED NOT DETECTED Final   Pseudomonas aeruginosa NOT DETECTED NOT DETECTED Final   Candida albicans NOT DETECTED NOT DETECTED Final   Candida glabrata NOT DETECTED NOT DETECTED Final   Candida krusei NOT DETECTED NOT DETECTED Final   Candida parapsilosis NOT DETECTED NOT DETECTED Final   Candida tropicalis NOT DETECTED NOT DETECTED Final    Comment: Performed at [facility], [place] [address], [place],  [address]  Urine culture     Status: Abnormal   Collection Time: 08/28/17  2:18 PM  Result Value Ref Range Status   Specimen Description   Final    URINE,  RANDOM Performed at [facility] [address], [place], [place] [address]    Special Requests   Final    NONE Performed at [facility], [place] [address], [place], [place] [address]    Culture (A)  Final    >=100,000 COLONIES/mL PSEUDOMONAS AERUGINOSA >=100,000 COLONIES/mL ENTEROCOCCUS FAECALIS    Report Status 08/31/2017 FINAL  Final   Organism ID, Bacteria PSEUDOMONAS AERUGINOSA (A)  Final   Organism ID, Bacteria ENTEROCOCCUS FAECALIS (A)  Final      Susceptibility   Enterococcus faecalis - MIC*    AMPICILLIN <=2 SENSITIVE Sensitive     LEVOFLOXACIN 0.5 SENSITIVE Sensitive     NITROFURANTOIN <=16 SENSITIVE Sensitive     VANCOMYCIN 1 SENSITIVE Sensitive     * >=100,000 COLONIES/mL ENTEROCOCCUS FAECALIS   Pseudomonas aeruginosa - MIC*    CEFTAZIDIME 4 SENSITIVE Sensitive     CIPROFLOXACIN <=0.25 SENSITIVE Sensitive     GENTAMICIN <=1 SENSITIVE Sensitive     IMIPENEM 2 SENSITIVE Sensitive     PIP/TAZO 8 SENSITIVE Sensitive     CEFEPIME 2 SENSITIVE Sensitive     * >=100,000 COLONIES/mL PSEUDOMONAS AERUGINOSA  MRSA PCR Screening     Status: Abnormal   Collection Time: 08/29/17  5:14 AM  Result Value Ref Range Status   MRSA by PCR POSITIVE (A) NEGATIVE Final    Comment:        The GeneXpert MRSA Assay (FDA approved for NASAL specimens only), is one component of a comprehensive MRSA colonization surveillance program. It is not intended to diagnose MRSA infection nor to guide or monitor treatment for MRSA infections. CRITICAL RESULT CALLED TO, READ BACK BY AND VERIFIED WITH: [facility] RN 08/29/2017 1107 JR Performed at [facility], [place] [address] [place], [place] [address]   Culture, blood (routine x 2)     Status: None   Collection Time: 08/31/17  2:38 PM  Result Value Ref Range Status   Specimen Description   Final    BLOOD LEFT ARM Performed at [facility] [address],  [place], [place] [address]    Special Requests   Final    BAA BCAV Performed at [facility] [address], [place], [place] [address]    Culture   Final    NO GROWTH 5 DAYS Performed at [facility], [place] [address], [place], [place] [address]    Report Status 09/05/2017 FINAL  Final  Culture, blood (routine x 2)     Status: None   Collection Time: 08/31/17  2:40 PM  Result Value Ref Range Status   Specimen Description   Final    BLOOD RIGHT ARM Performed at [facility] [address] [place], [place] [address]    Special Requests   Final    BAA BCAV Performed at [facility] [address] [place], [place] [address]    Culture   Final    NO GROWTH 5 DAYS Performed at [facility], [place] [address] [place], [place] [address]    Report Status 09/05/2017 FINAL  Final    Today   Subjective    Travis Wallace today has no new complaints, no fevers, oral intake is fair, no emesis          Patient has been seen and examined prior to discharge  Objective   Blood pressure (!) 145/61, pulse 61, temperature 98.2 F (36.8 C), temperature source Axillary, resp. rate 17, height 5' 4"  (1.626 m), weight 68.5 kg (151 lb), SpO2 97 %.   Intake/Output Summary (Last 24 hours) at 09/05/2017 1328 Last data filed at 09/05/2017 1025 Gross per 24 hour  Intake 470 ml  Output 425 ml  Net 45 ml   Exam Gen:-resting comfortably HEENT:- [place].AT, No sclera icterus Neck-Supple Neck,No JVD,.  Lungs-no wheezing, diminished in bases CV- S1, S2 normal Abd-  +ve B.Sounds, Abd Soft, No tenderness,    Extremity/Skin:- No  edema, Rt leg flexed underneath Buttocks Neuro/Psych-affect is flat, with severe cognitive deficits and generalized weakness   Data Review   CBC w Diff:  Lab Results  Component Value Date   WBC 5.7 09/04/2017   HGB 11.6 (L) 09/04/2017   HCT 35.8 (L) 09/04/2017   PLT 278 09/04/2017   LYMPHOPCT 17 08/28/2017    MONOPCT 7 08/28/2017   EOSPCT 2 08/28/2017   BASOPCT 0 08/28/2017    CMP:  Lab Results  Component Value Date   NA 134 (L) 09/04/2017   K 4.0 09/04/2017   CL 103 09/04/2017   CO2 24 09/04/2017   BUN 11 09/04/2017   CREATININE 0.70 09/04/2017   PROT 7.7 08/28/2017   ALBUMIN 3.2 (L) 08/28/2017   BILITOT 0.8 08/28/2017   ALKPHOS 109 08/28/2017   AST 19 08/28/2017   ALT 19 08/28/2017   Total Discharge time is about 33 minutes  Roxan Hockey M.D on 09/05/2017 at 1:28 PM  [facility]   Office  [phone]  Voice Recognition Viviann Spare dictation system was used to create this note, attempts have been made to correct errors. Please contact the author with questions and/or clarifications.

## 2017-09-05 NOTE — Discharge Instructions (Signed)
1)Palliative/Hospice Consult at [name] 2) comfort care measures with limitations to treatment please see MOST form

## 2017-09-05 NOTE — Progress Notes (Signed)
Pt legal guardian, Hollie BeachDeena Kersting called and informed that [facility]. Pt IV access removed 3/8. Report called to [facility], Transportation set up. Justin Mendaudle, Kalief Kattner H, RN

## 2017-11-02 ENCOUNTER — Non-Acute Institutional Stay: Payer: Medicare Other | Admitting: Internal Medicine

## 2017-11-02 DIAGNOSIS — F0391 Unspecified dementia with behavioral disturbance: Secondary | ICD-10-CM

## 2017-11-02 DIAGNOSIS — F03918 Unspecified dementia, unspecified severity, with other behavioral disturbance: Secondary | ICD-10-CM

## 2017-11-02 DIAGNOSIS — Z515 Encounter for palliative care: Secondary | ICD-10-CM

## 2017-11-03 DIAGNOSIS — Z515 Encounter for palliative care: Secondary | ICD-10-CM | POA: Insufficient documentation

## 2017-11-03 NOTE — Progress Notes (Signed)
PALLIATIVE CARE CONSULT VISIT   PATIENT NAME: Travis Wallace DOB: [birth year] MRN: [id number]  PRIMARY CARE PROVIDER: System, Pcp Not In  REFERRING PROVIDER: Dr. Irena Reichmann  RESPONSIBLE PARTY:   [name](daughter) [phone]   RECOMMENDATIONS and PLAN:  1.  Dementia with aggressive behavior F03.91:  Stable mood on Depakote and Seroquel. FAST 7d but continues to have a good appetite. He will need total care for remainder of lifespan.  Supportive care.  2.  Palliative care patient:  Previously established comfort care status and transition to Hospice care when pt declines further. He continues to have good nutritional intake.  OOB to a reclining chair as tolerated.  Palliative care will continue to follow and advise.  I spent 15 minutes providing this consultation,  from 2:20pm to 2:35pm at [facility]. More than 50% of the time in this consultation was spent coordinating communication with facility staff.   HISTORY OF PRESENT ILLNESS:  Follow-up with Montrelle Eddings finds that he remains in a state of decline as related to advanced dementia. Wound care nurse reports healing of wounds of the R lateral foot with current treatment.   CODE STATUS: DNR/DNI  PPS: 30%  In chair for meals HOSPICE ELIGIBILITY/DIAGNOSIS: TBD  PAST MEDICAL HISTORY:  Past Medical History:  Diagnosis Date  . Acute gangrenous cholecystitis 12/19/2016   s/p lap cholecystectomy 12/19/16  . Acute kidney injury (HCC)   . Alzheimer's dementia   . Alzheimer's dementia   . CAD (coronary artery disease) 12/17/2016  . Coronary artery disease   . Dementia 12/17/2016  . Depression   . Dyslipidemia   . GERD (gastroesophageal reflux disease)   . Hyperbilirubinemia   . Hypertension   . Insomnia   . OSA (obstructive sleep apnea) 12/17/2016  . Pericholecystic abscess 12/17/2016   s/p OR drainage 12/19/16  . Restless leg   . Severe sepsis (HCC) 12/17/2016  . Sinus bradycardia   . Sleep apnea     SOCIAL HX:  Social  History   Tobacco Use  . Smoking status: Never Smoker  . Smokeless tobacco: Never Used  Substance Use Topics  . Alcohol use: No    ALLERGIES:  Allergies  Allergen Reactions  . Memantine Other (See Comments)    Reaction:  Bradycardia  Other reaction(s): Other (See Comments) Other reaction(s): Other (See Comments) Reaction:Bradycardia  Reaction:  Bradycardia       PERTINENT MEDICATIONS:  Outpatient Encounter Medications as of 11/02/2017  Medication Sig  . acetaminophen (TYLENOL) 325 MG tablet Take 2 tablets (650 mg total) by mouth every 6 (six) hours as needed for mild pain, moderate pain or fever.  Marland Kitchen aspirin EC 81 MG tablet Take 81 mg by mouth daily.  . Baclofen 5 MG TABS Take 5 mg by mouth 3 (three) times daily.  . ciprofloxacin (CIPRO) 250 MG tablet Take 1 tablet (250 mg total) by mouth 2 (two) times daily.  . divalproex (DEPAKOTE SPRINKLE) 125 MG capsule Take 125-250 mg by mouth See admin instructions. Take 125 mg once during the day for mood stabilization, and take 250 mg by mouth at bedtime for dementia  . doxazosin (CARDURA) 4 MG tablet Take 4 mg by mouth daily.  Marland Kitchen lactulose (CHRONULAC) 10 GM/15ML solution Take 30 mLs by mouth daily as needed for mild constipation or moderate constipation.   Marland Kitchen LORazepam (ATIVAN) 0.5 MG tablet Take 1 tablet (0.5 mg total) by mouth every 8 (eight) hours as needed for anxiety, seizure or sleep.  Marland Kitchen omeprazole (PRILOSEC) 20  MG capsule Take 20 mg by mouth daily.  . psyllium (HYDROCIL/METAMUCIL) 95 % PACK Take 1 packet by mouth daily.  . QUEtiapine (SEROQUEL) 25 MG tablet Take 1 tablet (25 mg total) by mouth 2 (two) times daily. (Patient taking differently: Take 50 mg by mouth at bedtime. )  . rOPINIRole (REQUIP) 1 MG tablet Take 1 mg by mouth at bedtime.  . tamsulosin (FLOMAX) 0.4 MG CAPS capsule Take 1 capsule (0.4 mg total) by mouth daily after supper.   No facility-administered encounter medications on file as of 11/02/2017.     PHYSICAL EXAM:    General: Fatigued and frail appearing elderly male resting with eyes closed in geri chair Cardiovascular: regular rate and rhythm. Systolic murmur Pulmonary: clear ant fields Abdomen: soft, nontender, + bowel sounds Extremities: 1+ edema RLE/foot.  Legs are dependent. Skin:Reports of tissue injury R lateral foot but not visualized due to bandage Neurological: Somnolent, arouses easily and returns to sleep easily.  Nonverbal and does not follow commands.  Generalized weakness. Psych:  Calm and cooperative.  Flat affect  Margaretha Sheffield, NP-C

## 2018-01-11 ENCOUNTER — Non-Acute Institutional Stay: Payer: Medicare Other | Admitting: Internal Medicine

## 2018-01-11 DIAGNOSIS — Z515 Encounter for palliative care: Secondary | ICD-10-CM

## 2018-01-11 DIAGNOSIS — F03918 Unspecified dementia, unspecified severity, with other behavioral disturbance: Secondary | ICD-10-CM

## 2018-01-11 DIAGNOSIS — F0391 Unspecified dementia with behavioral disturbance: Secondary | ICD-10-CM

## 2018-01-14 NOTE — Progress Notes (Signed)
PALLIATIVE CARE CONSULT VISIT   PATIENT NAME: Travis Wallace DOB: 01-Nov-1930 MRN: [id number]  PRIMARY CARE PROVIDER: System, Pcp Not In  REFERRING PROVIDER: Dr. Irena Reichmann  RESPONSIBLE PARTY:   [name](daughter) [phone]   RECOMMENDATIONS and PLAN:  1.  Dementia with aggressive behavior F03.91:  FAST stage 7d with good nutritional intake.  No recent acute decline.  Hospice appropriate when additional decline occurs.  Will continue to monitor nutritional/ functional and cognitive status.  2.  Palliative care patient:  DNR with comfort care planned.  Pt. Is in a steady state of advanced dementia. OOB to a reclining chair as tolerated.  Palliative care will continue to follow and advise.  I spent 15 minutes providing this consultation,  from 2:30pm to 2:45pm at [facility]. More than 50% of the time in this consultation was spent coordinating communication with facility staff.   HISTORY OF PRESENT ILLNESS:  Follow-up with Travis Wallace. He remains in a state of decline as related to advanced dementia. Reports of recent fall without injury.  Reports that he continues to have a good appetite but remains total care. CODE STATUS: DNR/DNI  PPS: 30%  In chair for meals HOSPICE ELIGIBILITY/DIAGNOSIS: TBD  PAST MEDICAL HISTORY:  Past Medical History:  Diagnosis Date  . Acute gangrenous cholecystitis 12/19/2016   s/p lap cholecystectomy 12/19/16  . Acute kidney injury (HCC)   . Alzheimer's dementia   . Alzheimer's dementia   . CAD (coronary artery disease) 12/17/2016  . Coronary artery disease   . Dementia 12/17/2016  . Depression   . Dyslipidemia   . GERD (gastroesophageal reflux disease)   . Hyperbilirubinemia   . Hypertension   . Insomnia   . OSA (obstructive sleep apnea) 12/17/2016  . Pericholecystic abscess 12/17/2016   s/p OR drainage 12/19/16  . Restless leg   . Severe sepsis (HCC) 12/17/2016  . Sinus bradycardia   . Sleep apnea     SOCIAL HX:  Social History    Tobacco Use  . Smoking status: Never Smoker  . Smokeless tobacco: Never Used  Substance Use Topics  . Alcohol use: No    ALLERGIES:  Allergies  Allergen Reactions  . Memantine Other (See Comments)    Reaction:  Bradycardia  Other reaction(s): Other (See Comments) Other reaction(s): Other (See Comments) Reaction:Bradycardia  Reaction:  Bradycardia       PERTINENT MEDICATIONS:  Outpatient Encounter Medications as of 11/02/2017  Medication Sig  . acetaminophen (TYLENOL) 325 MG tablet Take 2 tablets (650 mg total) by mouth every 6 (six) hours as needed for mild pain, moderate pain or fever.  Marland Kitchen aspirin EC 81 MG tablet Take 81 mg by mouth daily.  . Baclofen 5 MG TABS Take 5 mg by mouth 3 (three) times daily.  . ciprofloxacin (CIPRO) 250 MG tablet Take 1 tablet (250 mg total) by mouth 2 (two) times daily.  . divalproex (DEPAKOTE SPRINKLE) 125 MG capsule Take 125-250 mg by mouth See admin instructions. Take 125 mg once during the day for mood stabilization, and take 250 mg by mouth at bedtime for dementia  . doxazosin (CARDURA) 4 MG tablet Take 4 mg by mouth daily.  Marland Kitchen lactulose (CHRONULAC) 10 GM/15ML solution Take 30 mLs by mouth daily as needed for mild constipation or moderate constipation.   Marland Kitchen LORazepam (ATIVAN) 0.5 MG tablet Take 1 tablet (0.5 mg total) by mouth every 8 (eight) hours as needed for anxiety, seizure or sleep.  Marland Kitchen omeprazole (PRILOSEC) 20 MG capsule Take 20  mg by mouth daily.  . psyllium (HYDROCIL/METAMUCIL) 95 % PACK Take 1 packet by mouth daily.  . QUEtiapine (SEROQUEL) 25 MG tablet Take 1 tablet (25 mg total) by mouth 2 (two) times daily. (Patient taking differently: Take 50 mg by mouth at bedtime. )  . rOPINIRole (REQUIP) 1 MG tablet Take 1 mg by mouth at bedtime.  . tamsulosin (FLOMAX) 0.4 MG CAPS capsule Take 1 capsule (0.4 mg total) by mouth daily after supper.   No facility-administered encounter medications on file as of 11/02/2017.     PHYSICAL EXAM:    General: Fatigued and frail appearing elderly male resting with eyes closed in geri chair Cardiovascular: regular rate and rhythm. Systolic murmur Pulmonary: clear ant fields Abdomen: soft, nontender, + bowel sounds Extremities: Trace edema RLE/foot.  Compression socks in use Skin:Reports of wound R foot, not visualized Neurological: Somnolent, arouses easily and returns to sleep easily.  Nonverbal and does not follow commands.  Generalized weakness. Psych:  Calm and cooperative.  Flat affect  Margaretha SheffieldShirley Wynema Garoutte, NP-C

## 2018-03-15 ENCOUNTER — Non-Acute Institutional Stay: Payer: Medicare Other | Admitting: Internal Medicine

## 2018-03-15 DIAGNOSIS — F0391 Unspecified dementia with behavioral disturbance: Secondary | ICD-10-CM

## 2018-03-15 DIAGNOSIS — Z515 Encounter for palliative care: Secondary | ICD-10-CM

## 2018-03-15 DIAGNOSIS — F03918 Unspecified dementia, unspecified severity, with other behavioral disturbance: Secondary | ICD-10-CM

## 2018-03-25 NOTE — Progress Notes (Signed)
PALLIATIVE CARE CONSULT VISIT   PATIENT NAME: Travis Wallace DOB: 12-Jan-1931 MRN: [id number]  PRIMARY CARE PROVIDER:  Dr Bernette Redbird  REFERRING PROVIDER: Dr. Bernette Redbird  RESPONSIBLE PARTY:   [name](daughter) [phone]   RECOMMENDATIONS and PLAN:  1.  Dementia with  behavior F03.91:   FAST stage 7d with increased functional decline and  good nutritional intake.  Hospice appropriate when additional decline occurs.  Will continue to monitor nutritional/ functional and cognitive status.  2.  Palliative care patient:  DNR with comfort care planned.  Pt. Is in a steady state of advanced dementia. Promote saft environment and close observation.  No plans for returning to hospital. OOB to a reclining chair as tolerated.  Palliative care will continue to follow and advise.  I spent 15 minutes providing this consultation,  from 4:00pm to 4:15pm at [facility]. More than 50% of the time in this consultation was spent coordinating communication with facility staff.   HISTORY OF PRESENT ILLNESS:  Follow-up with Travis Wallace. Staff reports that patient intermittently climbs out of bed or recliner.  No reported injuries with falls.  Reports that he continues to have a good appetite but remains total care.  No acute illnesses or return to hospital.  CODE STATUS: DNR/DNI  PPS: 30%  In chair for meals HOSPICE ELIGIBILITY/DIAGNOSIS: TBD  PAST MEDICAL HISTORY:  Past Medical History:  Diagnosis Date  . Acute gangrenous cholecystitis 12/19/2016   s/p lap cholecystectomy 12/19/16  . Acute kidney injury (HCC)   . Alzheimer's dementia   . Alzheimer's dementia   . CAD (coronary artery disease) 12/17/2016  . Coronary artery disease   . Dementia 12/17/2016  . Depression   . Dyslipidemia   . GERD (gastroesophageal reflux disease)   . Hyperbilirubinemia   . Hypertension   . Insomnia   . OSA (obstructive sleep apnea) 12/17/2016  . Pericholecystic abscess 12/17/2016   s/p OR drainage 12/19/16  .  Restless leg   . Severe sepsis (HCC) 12/17/2016  . Sinus bradycardia   . Sleep apnea     SOCIAL HX:  Social History   Tobacco Use  . Smoking status: Never Smoker  . Smokeless tobacco: Never Used  Substance Use Topics  . Alcohol use: No    ALLERGIES:  Allergies  Allergen Reactions  . Memantine Other (See Comments)    Reaction:  Bradycardia  Other reaction(s): Other (See Comments) Other reaction(s): Other (See Comments) Reaction:Bradycardia  Reaction:  Bradycardia       PERTINENT MEDICATIONS:  Outpatient Encounter Medications as of 11/02/2017  Medication Sig  . acetaminophen (TYLENOL) 325 MG tablet Take 2 tablets (650 mg total) by mouth every 6 (six) hours as needed for mild pain, moderate pain or fever.  Marland Kitchen aspirin EC 81 MG tablet Take 81 mg by mouth daily.  . Baclofen 5 MG TABS Take 5 mg by mouth 3 (three) times daily.  . ciprofloxacin (CIPRO) 250 MG tablet Take 1 tablet (250 mg total) by mouth 2 (two) times daily.  . divalproex (DEPAKOTE SPRINKLE) 125 MG capsule Take 125-250 mg by mouth See admin instructions. Take 125 mg once during the day for mood stabilization, and take 250 mg by mouth at bedtime for dementia  . doxazosin (CARDURA) 4 MG tablet Take 4 mg by mouth daily.  Marland Kitchen lactulose (CHRONULAC) 10 GM/15ML solution Take 30 mLs by mouth daily as needed for mild constipation or moderate constipation.   Marland Kitchen LORazepam (ATIVAN) 0.5 MG tablet Take 1 tablet (0.5 mg total)  by mouth every 8 (eight) hours as needed for anxiety, seizure or sleep.  Marland Kitchen omeprazole (PRILOSEC) 20 MG capsule Take 20 mg by mouth daily.  . psyllium (HYDROCIL/METAMUCIL) 95 % PACK Take 1 packet by mouth daily.  . QUEtiapine (SEROQUEL) 25 MG tablet Take 1 tablet (25 mg total) by mouth 2 (two) times daily. (Patient taking differently: Take 50 mg by mouth at bedtime. )  . rOPINIRole (REQUIP) 1 MG tablet Take 1 mg by mouth at bedtime.  . tamsulosin (FLOMAX) 0.4 MG CAPS capsule Take 1 capsule (0.4 mg total) by mouth  daily after supper.   No facility-administered encounter medications on file as of 11/02/2017.     PHYSICAL EXAM:   General: Chronically ill and frail appearing elderly male resting in recliner.  In NAD. Cardiovascular: regular rate and rhythm. Systolic murmur Pulmonary: clear ant fields Abdomen: soft, nontender, + bowel sounds Extremities: Trace edema BLE Skin:Reports of wound R foot, not visualized Neurological: Alert. Unable to determine orientation due to cognitive decline.  Does not follow commands.  Speaks in 2-3 words. Generalized weakness. Psych:  Fidgety but cooperative     Margaretha Sheffield, NP-C

## 2018-05-19 ENCOUNTER — Non-Acute Institutional Stay: Payer: Medicare Other | Admitting: Internal Medicine

## 2019-07-22 IMAGING — CT CT ABD-PELV W/ CM
2 of 5 series · 16 of 46 positions shown, 18 images · IV contrast (ISOVUE)
Comparison: 12/17/2016

CLINICAL DATA: foley for UTI currently prescribed PO cipro however
is not able to take pills."

EXAM:
CT ABDOMEN AND PELVIS WITH CONTRAST
TECHNIQUE: Multidetector CT imaging of the abdomen and pelvis was performed
using the standard protocol following bolus administration of
intravenous contrast.
CONTRAST:  IVVU4E-MCC IOPAMIDOL (IVVU4E-MCC) INJECTION 61%

[Series 2: axial st · axial · 0.89mm/px · z∈[+1033,+1463]mm · 13 of 100 slices shown, 15 images]
[im 7/100  soft-tissue]
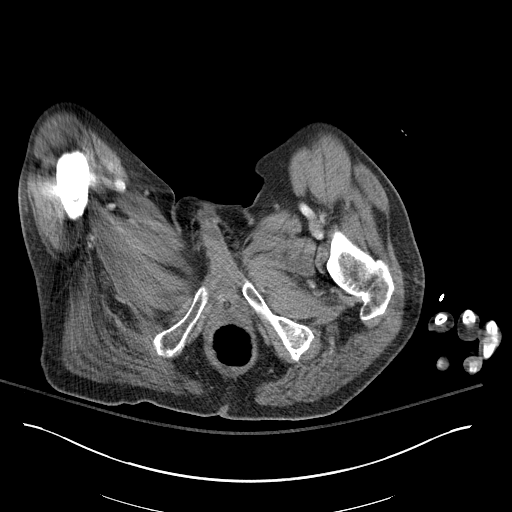
[im 7/100  bone]
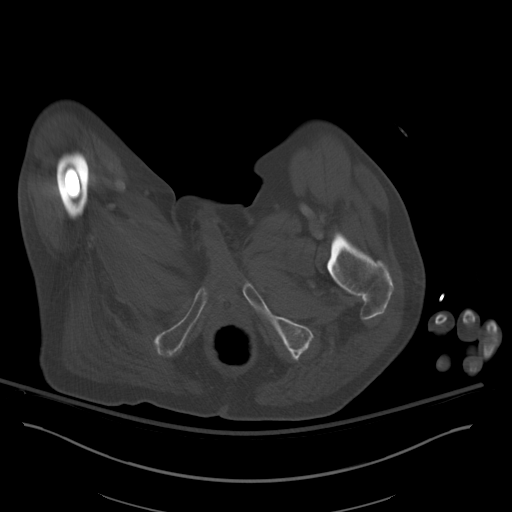
[im 14/100  soft-tissue]
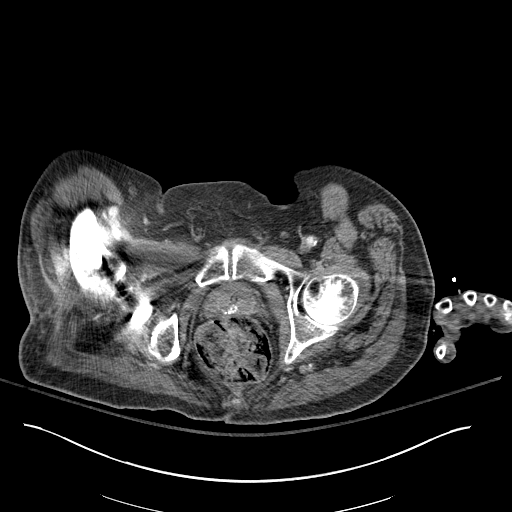
[im 20/100  soft-tissue]
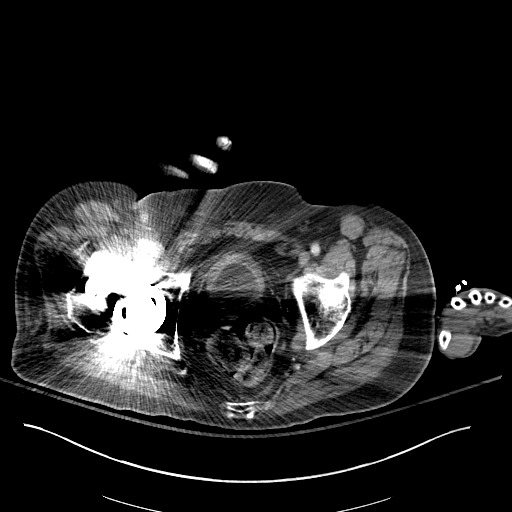
[im 27/100  soft-tissue]
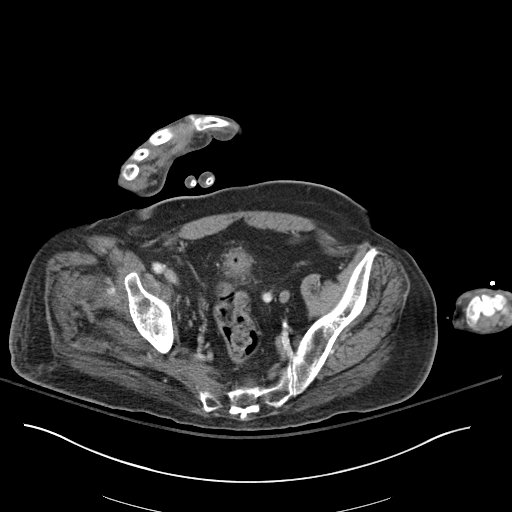
[im 34/100  soft-tissue]
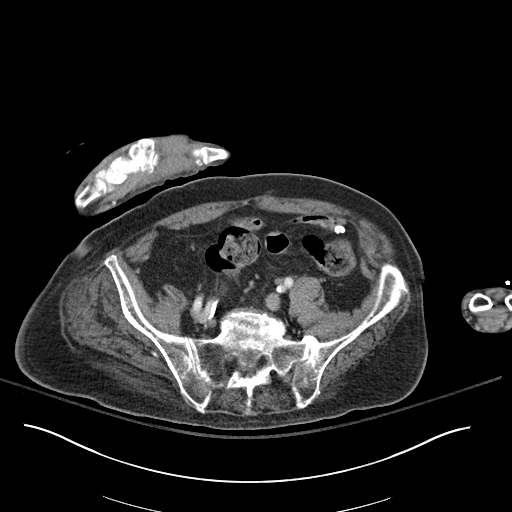
[im 40/100  soft-tissue]
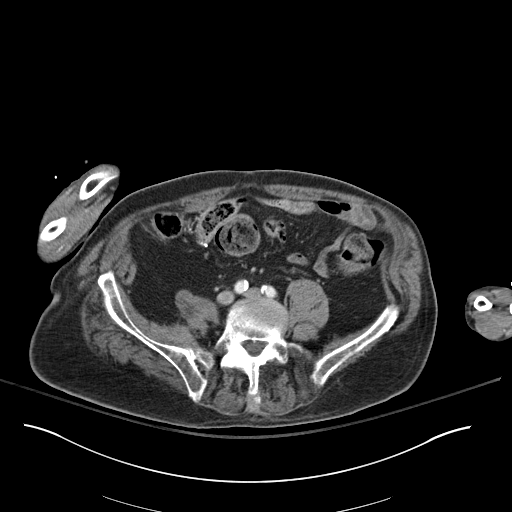
[im 53/100  soft-tissue]
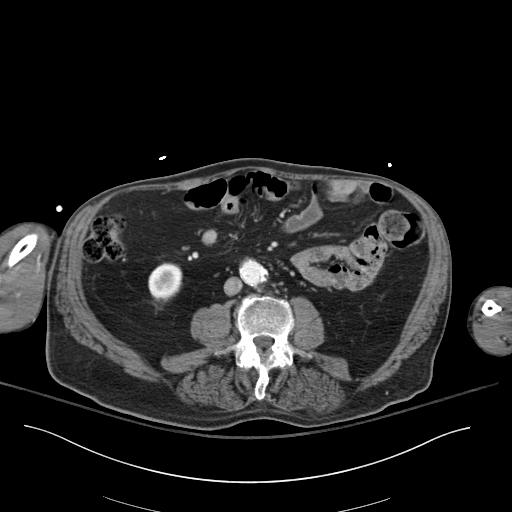
[im 60/100  soft-tissue]
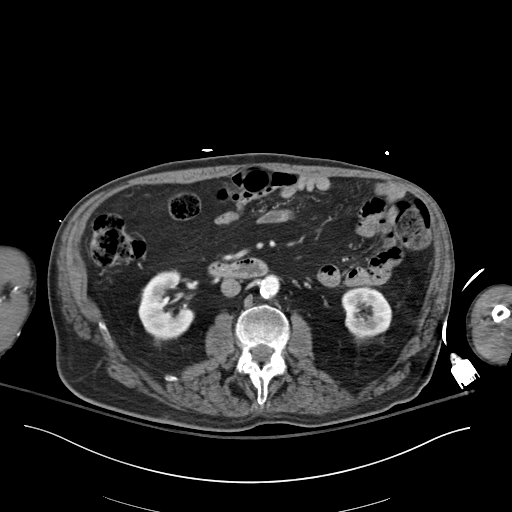
[im 67/100  soft-tissue]
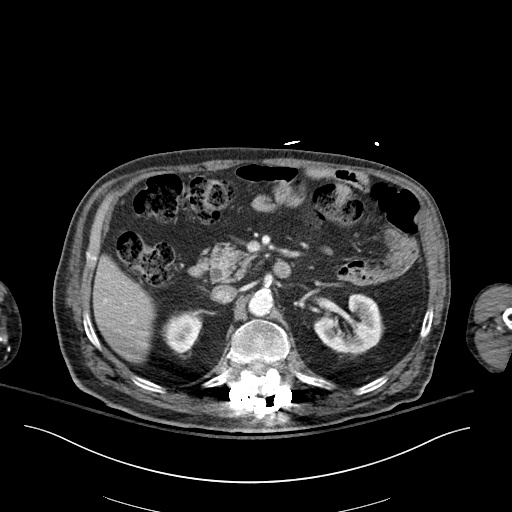
[im 67/100  bone]
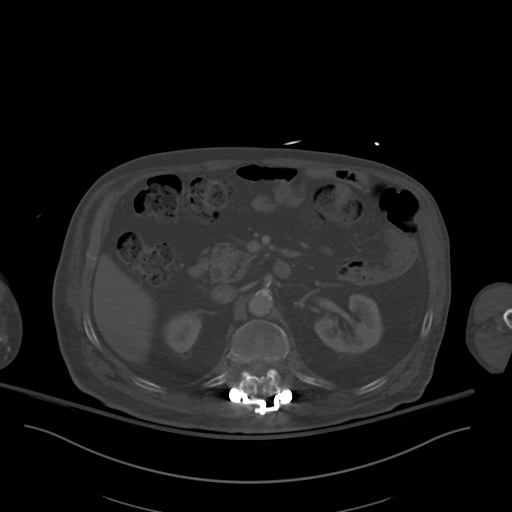
[im 73/100  soft-tissue]
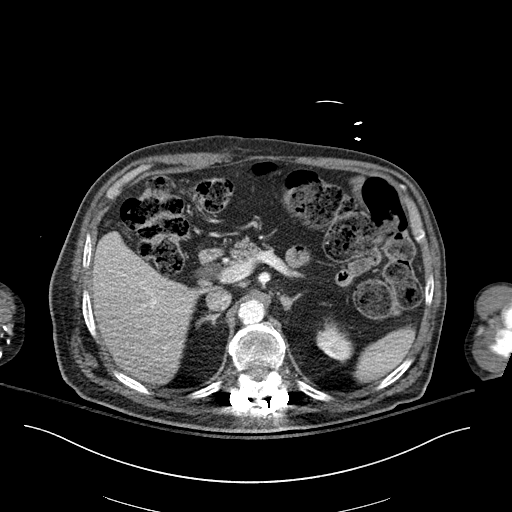
[im 80/100  soft-tissue]
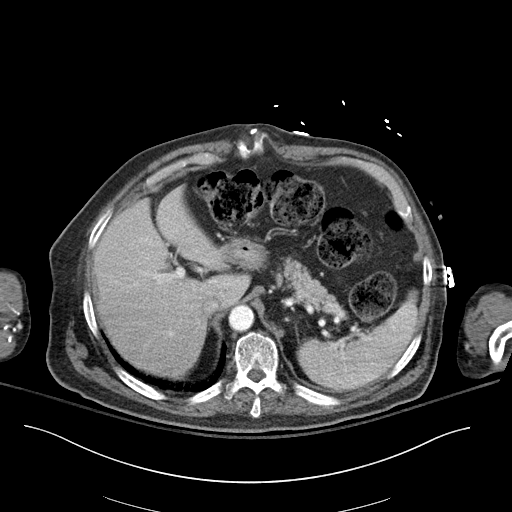
[im 86/100  soft-tissue]
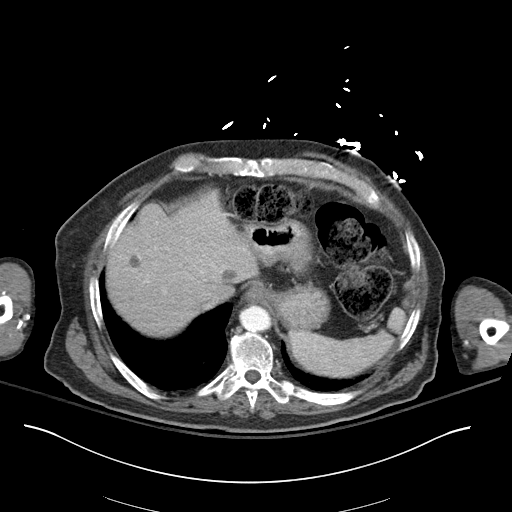
[im 93/100  soft-tissue]
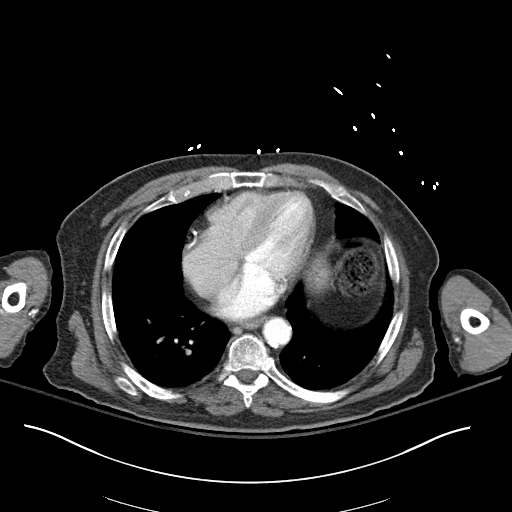

[Series 4: coronal st · coronal · 0.89mm/px · 3 of 82 slices shown]
[im 28/82  soft-tissue]
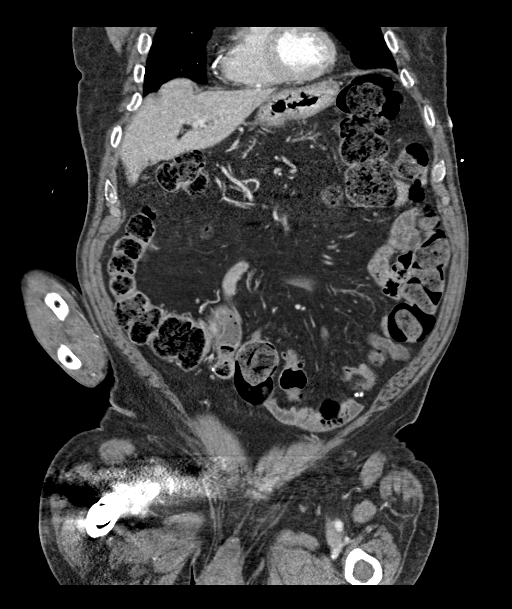
[im 37/82  soft-tissue]
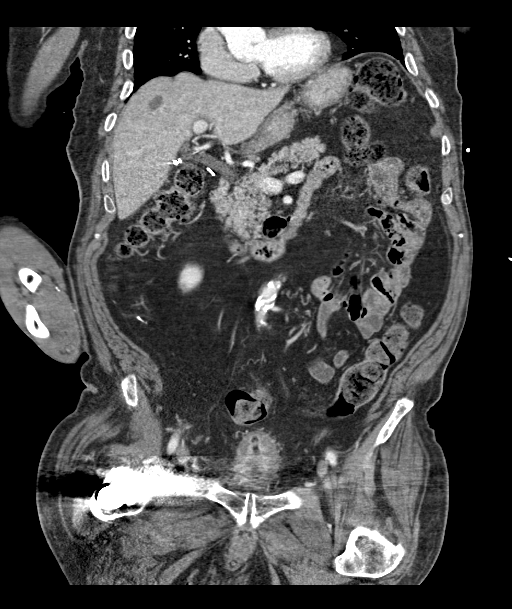
[im 46/82  soft-tissue]
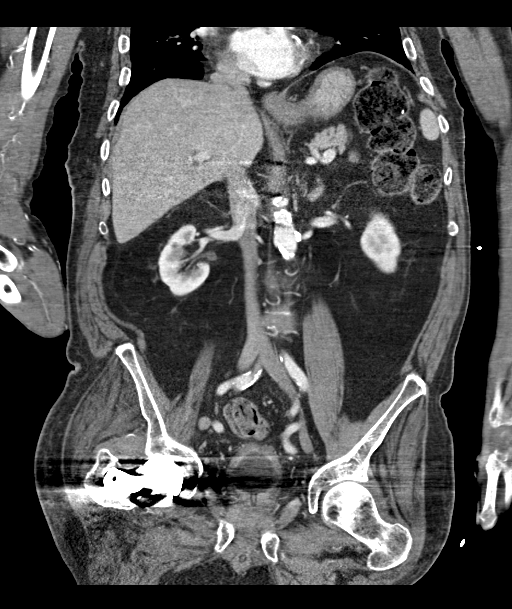

[16 of 46 positions shown; findings below may reference images not displayed]

FINDINGS: Lower chest: Coronary calcifications. No pleural or pericardial
effusion. Previous median sternotomy. Visualized lung bases clear.

Hepatobiliary: Stable benign-appearing hepatic cysts in segments 2
and 8. no new liver lesion. No intrahepatic biliary ductal
dilatation. Cholecystectomy clips. Prominent CBD to the ampullary as
before.

Pancreas: Unremarkable. No pancreatic ductal dilatation or
surrounding inflammatory changes.

Spleen: Normal in size without focal abnormality.

Adrenals/Urinary Tract: Adrenal glands and kidneys unremarkable. No
hydronephrosis. Foley catheter decompresses urinary bladder.

Stomach/Bowel: Stomach is nondistended. Small bowel decompressed.
Staple line near the base of the cecum. Appendix not identified.
Moderate colonic fecal material without dilatation. Scattered
sigmoid diverticula without adjacent inflammatory/edematous change
or abscess. Fecal distention of the rectum.

Vascular/Lymphatic: Moderate aortoiliac atheromatous calcifications
without aneurysm. Portal vein patent. Bilateral pelvic phleboliths.
No abdominal or pelvic adenopathy localized.

Reproductive: Mild prostatic enlargement with central coarse
calcifications.

Other: No ascites.  No free air.

Musculoskeletal: Posterior surgical fixation hardware T11-L1, intact
without surrounding lucency. Degenerative disc disease L5-S1.
Previous median sternotomy. Right hip arthroplasty components
without fracture or dislocation. No worrisome bone lesion.
IMPRESSION: 1. No acute findings.
2. Sigmoid diverticula.
3. Coronary and aortoiliac   atherosclerosis ([id number])
4. Postop changes as above.

## 2020-04-30 ENCOUNTER — Other Ambulatory Visit: Payer: Self-pay

## 2020-04-30 ENCOUNTER — Emergency Department (HOSPITAL_COMMUNITY): Payer: Medicare Other

## 2020-04-30 ENCOUNTER — Inpatient Hospital Stay (HOSPITAL_COMMUNITY)
Admission: EM | Admit: 2020-04-30 | Discharge: 2020-05-02 | DRG: 871 | Disposition: A | Discharge status: Hospice | Payer: Medicare Other | Source: Skilled Nursing Facility | Attending: Student in an Organized Health Care Education/Training Program | Admitting: Student in an Organized Health Care Education/Training Program

## 2020-04-30 DIAGNOSIS — Z87440 Personal history of urinary (tract) infections: Secondary | ICD-10-CM

## 2020-04-30 DIAGNOSIS — N179 Acute kidney failure, unspecified: Secondary | ICD-10-CM | POA: Diagnosis not present

## 2020-04-30 DIAGNOSIS — A419 Sepsis, unspecified organism: Principal | ICD-10-CM | POA: Diagnosis present

## 2020-04-30 DIAGNOSIS — Z888 Allergy status to other drugs, medicaments and biological substances status: Secondary | ICD-10-CM

## 2020-04-30 DIAGNOSIS — E870 Hyperosmolality and hypernatremia: Secondary | ICD-10-CM | POA: Diagnosis not present

## 2020-04-30 DIAGNOSIS — F0281 Dementia in other diseases classified elsewhere with behavioral disturbance: Secondary | ICD-10-CM | POA: Diagnosis not present

## 2020-04-30 DIAGNOSIS — N400 Enlarged prostate without lower urinary tract symptoms: Secondary | ICD-10-CM | POA: Diagnosis present

## 2020-04-30 DIAGNOSIS — Z8249 Family history of ischemic heart disease and other diseases of the circulatory system: Secondary | ICD-10-CM

## 2020-04-30 DIAGNOSIS — Z515 Encounter for palliative care: Secondary | ICD-10-CM | POA: Diagnosis not present

## 2020-04-30 DIAGNOSIS — G309 Alzheimer's disease, unspecified: Secondary | ICD-10-CM | POA: Diagnosis present

## 2020-04-30 DIAGNOSIS — I2510 Atherosclerotic heart disease of native coronary artery without angina pectoris: Secondary | ICD-10-CM | POA: Diagnosis present

## 2020-04-30 DIAGNOSIS — E861 Hypovolemia: Secondary | ICD-10-CM | POA: Diagnosis present

## 2020-04-30 DIAGNOSIS — I10 Essential (primary) hypertension: Secondary | ICD-10-CM | POA: Diagnosis present

## 2020-04-30 DIAGNOSIS — Z79899 Other long term (current) drug therapy: Secondary | ICD-10-CM

## 2020-04-30 DIAGNOSIS — E785 Hyperlipidemia, unspecified: Secondary | ICD-10-CM | POA: Diagnosis present

## 2020-04-30 DIAGNOSIS — R0603 Acute respiratory distress: Secondary | ICD-10-CM

## 2020-04-30 DIAGNOSIS — K219 Gastro-esophageal reflux disease without esophagitis: Secondary | ICD-10-CM | POA: Diagnosis present

## 2020-04-30 DIAGNOSIS — Z7189 Other specified counseling: Secondary | ICD-10-CM

## 2020-04-30 DIAGNOSIS — J9601 Acute respiratory failure with hypoxia: Secondary | ICD-10-CM | POA: Diagnosis not present

## 2020-04-30 DIAGNOSIS — Z9049 Acquired absence of other specified parts of digestive tract: Secondary | ICD-10-CM

## 2020-04-30 DIAGNOSIS — E860 Dehydration: Secondary | ICD-10-CM | POA: Diagnosis present

## 2020-04-30 DIAGNOSIS — F0280 Dementia in other diseases classified elsewhere without behavioral disturbance: Secondary | ICD-10-CM | POA: Diagnosis present

## 2020-04-30 DIAGNOSIS — E872 Acidosis: Secondary | ICD-10-CM | POA: Diagnosis not present

## 2020-04-30 DIAGNOSIS — G2581 Restless legs syndrome: Secondary | ICD-10-CM | POA: Diagnosis present

## 2020-04-30 DIAGNOSIS — R1310 Dysphagia, unspecified: Secondary | ICD-10-CM | POA: Diagnosis present

## 2020-04-30 DIAGNOSIS — J690 Pneumonitis due to inhalation of food and vomit: Secondary | ICD-10-CM | POA: Diagnosis not present

## 2020-04-30 DIAGNOSIS — Z20822 Contact with and (suspected) exposure to covid-19: Secondary | ICD-10-CM | POA: Diagnosis present

## 2020-04-30 DIAGNOSIS — F0391 Unspecified dementia with behavioral disturbance: Secondary | ICD-10-CM | POA: Diagnosis not present

## 2020-04-30 DIAGNOSIS — Z823 Family history of stroke: Secondary | ICD-10-CM

## 2020-04-30 DIAGNOSIS — R0600 Dyspnea, unspecified: Secondary | ICD-10-CM | POA: Diagnosis not present

## 2020-04-30 DIAGNOSIS — G4733 Obstructive sleep apnea (adult) (pediatric): Secondary | ICD-10-CM | POA: Diagnosis present

## 2020-04-30 DIAGNOSIS — J189 Pneumonia, unspecified organism: Secondary | ICD-10-CM | POA: Diagnosis not present

## 2020-04-30 DIAGNOSIS — L89521 Pressure ulcer of left ankle, stage 1: Secondary | ICD-10-CM | POA: Diagnosis not present

## 2020-04-30 DIAGNOSIS — E43 Unspecified severe protein-calorie malnutrition: Secondary | ICD-10-CM | POA: Diagnosis present

## 2020-04-30 DIAGNOSIS — Z66 Do not resuscitate: Secondary | ICD-10-CM | POA: Diagnosis present

## 2020-04-30 DIAGNOSIS — Z800 Family history of malignant neoplasm of digestive organs: Secondary | ICD-10-CM

## 2020-04-30 DIAGNOSIS — Z7982 Long term (current) use of aspirin: Secondary | ICD-10-CM

## 2020-04-30 DIAGNOSIS — Z951 Presence of aortocoronary bypass graft: Secondary | ICD-10-CM

## 2020-04-30 LAB — CBC WITH DIFFERENTIAL/PLATELET
Abs Immature Granulocytes: 0.04 10*3/uL (ref 0.00–0.07)
Basophils Absolute: 0 10*3/uL (ref 0.0–0.1)
Basophils Relative: 0 %
Eosinophils Absolute: 0 10*3/uL (ref 0.0–0.5)
Eosinophils Relative: 0 %
HCT: 55.7 % — ABNORMAL HIGH (ref 39.0–52.0)
Hemoglobin: 16.9 g/dL (ref 13.0–17.0)
Immature Granulocytes: 0 %
Lymphocytes Relative: 5 %
Lymphs Abs: 0.6 10*3/uL — ABNORMAL LOW (ref 0.7–4.0)
MCH: 30.1 pg (ref 26.0–34.0)
MCHC: 30.3 g/dL (ref 30.0–36.0)
MCV: 99.1 fL (ref 80.0–100.0)
Monocytes Absolute: 1.4 10*3/uL — ABNORMAL HIGH (ref 0.1–1.0)
Monocytes Relative: 12 %
Neutro Abs: 10.4 10*3/uL — ABNORMAL HIGH (ref 1.7–7.7)
Neutrophils Relative %: 83 %
Platelets: 198 10*3/uL (ref 150–400)
RBC: 5.62 MIL/uL (ref 4.22–5.81)
RDW: 14 % (ref 11.5–15.5)
WBC: 12.5 10*3/uL — ABNORMAL HIGH (ref 4.0–10.5)
nRBC: 0 % (ref 0.0–0.2)

## 2020-04-30 LAB — URINALYSIS, ROUTINE W REFLEX MICROSCOPIC
Bilirubin Urine: NEGATIVE
Glucose, UA: NEGATIVE mg/dL
Ketones, ur: NEGATIVE mg/dL
Nitrite: NEGATIVE
Protein, ur: 30 mg/dL — AB
RBC / HPF: >50 RBC/hpf — ABNORMAL HIGH (ref 0–5)
Specific Gravity, Urine: 1.025 (ref 1.005–1.030)
pH: 5 (ref 5.0–8.0)

## 2020-04-30 LAB — BLOOD GAS, VENOUS
Acid-base deficit: 3.1 mmol/L — ABNORMAL HIGH (ref 0.0–2.0)
Bicarbonate: 21.3 mmol/L (ref 20.0–28.0)
FIO2: 15
O2 Saturation: 93.7 %
Patient temperature: 37
pCO2, Ven: 37.2 mmHg — ABNORMAL LOW (ref 44.0–60.0)
pH, Ven: 7.375 (ref 7.250–7.430)
pO2, Ven: 72.1 mmHg — ABNORMAL HIGH (ref 32.0–45.0)

## 2020-04-30 LAB — BASIC METABOLIC PANEL
Anion gap: 16 — ABNORMAL HIGH (ref 5–15)
Anion gap: 9 (ref 5–15)
BUN: 38 mg/dL — ABNORMAL HIGH (ref 8–23)
BUN: 26 mg/dL — ABNORMAL HIGH (ref 8–23)
CO2: 20 mmol/L — ABNORMAL LOW (ref 22–32)
CO2: 24 mmol/L (ref 22–32)
Calcium: 8.6 mg/dL — ABNORMAL LOW (ref 8.9–10.3)
Calcium: 8.2 mg/dL — ABNORMAL LOW (ref 8.9–10.3)
Chloride: 119 mmol/L — ABNORMAL HIGH (ref 98–111)
Chloride: 118 mmol/L — ABNORMAL HIGH (ref 98–111)
Creatinine, Ser: 1.63 mg/dL — ABNORMAL HIGH (ref 0.61–1.24)
Creatinine, Ser: 1.1 mg/dL (ref 0.61–1.24)
GFR, Estimated: 41 mL/min — ABNORMAL LOW (ref >60)
GFR, Estimated: >60 mL/min (ref >60)
Glucose, Bld: 187 mg/dL — ABNORMAL HIGH (ref 70–99)
Glucose, Bld: 228 mg/dL — ABNORMAL HIGH (ref 70–99)
Potassium: 3.9 mmol/L (ref 3.5–5.1)
Potassium: 3.4 mmol/L — ABNORMAL LOW (ref 3.5–5.1)
Sodium: 155 mmol/L — ABNORMAL HIGH (ref 135–145)
Sodium: 151 mmol/L — ABNORMAL HIGH (ref 135–145)

## 2020-04-30 LAB — LACTIC ACID, PLASMA
Lactic Acid, Venous: 4.6 mmol/L (ref 0.5–1.9)
Lactic Acid, Venous: 5.6 mmol/L (ref 0.5–1.9)
Lactic Acid, Venous: 6 mmol/L (ref 0.5–1.9)
Lactic Acid, Venous: 3.8 mmol/L (ref 0.5–1.9)
Lactic Acid, Venous: 3.7 mmol/L (ref 0.5–1.9)

## 2020-04-30 LAB — PROCALCITONIN: Procalcitonin: 0.14 ng/mL

## 2020-04-30 LAB — TROPONIN I (HIGH SENSITIVITY)
Troponin I (High Sensitivity): 13 ng/L (ref <18)
Troponin I (High Sensitivity): 9 ng/L (ref <18)

## 2020-04-30 LAB — RESPIRATORY PANEL BY RT PCR (FLU A&B, COVID)
Influenza A by PCR: NEGATIVE
Influenza B by PCR: NEGATIVE
SARS Coronavirus 2 by RT PCR: NEGATIVE

## 2020-04-30 LAB — CBG MONITORING, ED: Glucose-Capillary: 198 mg/dL — ABNORMAL HIGH (ref 70–99)

## 2020-04-30 LAB — PROTIME-INR
INR: 1.2 (ref 0.8–1.2)
INR: 1.4 — ABNORMAL HIGH (ref 0.8–1.2)
Prothrombin Time: 15.1 seconds (ref 11.4–15.2)
Prothrombin Time: 17 seconds — ABNORMAL HIGH (ref 11.4–15.2)

## 2020-04-30 MED ORDER — VANCOMYCIN HCL IN DEXTROSE 1-5 GM/200ML-% IV SOLN
1000.0000 mg | Freq: Once | INTRAVENOUS | Status: DC
  Filled 2020-04-30: qty 200

## 2020-04-30 MED ORDER — LACTATED RINGERS IV BOLUS (SEPSIS)
1000.0000 mL | Freq: Once | INTRAVENOUS | Status: AC
  Administered 2020-04-30: 1000 mL via INTRAVENOUS

## 2020-04-30 MED ORDER — VANCOMYCIN HCL 1250 MG/250ML IV SOLN
1250.0000 mg | Freq: Once | INTRAVENOUS | Status: AC
  Administered 2020-04-30: 1250 mg via INTRAVENOUS
  Filled 2020-04-30: qty 250

## 2020-04-30 MED ORDER — PIPERACILLIN-TAZOBACTAM 4.5 G IVPB: 4.5000 g | Freq: Once | INTRAVENOUS | Status: DC

## 2020-04-30 MED ORDER — VANCOMYCIN HCL 750 MG/150ML IV SOLN
750.0000 mg | INTRAVENOUS | Status: DC
  Filled 2020-04-30: qty 150

## 2020-04-30 MED ORDER — ACETAMINOPHEN 325 MG PO TABS: 650.0000 mg | ORAL_TABLET | Freq: Four times a day (QID) | ORAL | Status: DC | PRN

## 2020-04-30 MED ORDER — LACTATED RINGERS IV BOLUS
1000.0000 mL | Freq: Once | INTRAVENOUS | Status: AC
  Administered 2020-04-30: 1000 mL via INTRAVENOUS

## 2020-04-30 MED ORDER — LACTATED RINGERS IV SOLN: INTRAVENOUS | Status: DC

## 2020-04-30 MED ORDER — DEXTROSE-NACL 5-0.45 % IV SOLN
INTRAVENOUS | Status: DC
Start: 2020-04-30 — End: 2020-04-30

## 2020-04-30 MED ORDER — ENOXAPARIN SODIUM 30 MG/0.3ML ~~LOC~~ SOLN
30.0000 mg | Freq: Every day | SUBCUTANEOUS | Status: DC
  Administered 2020-04-30: 30 mg via SUBCUTANEOUS
  Filled 2020-04-30: qty 0.3

## 2020-04-30 MED ORDER — ACETAMINOPHEN 650 MG RE SUPP
650.0000 mg | Freq: Four times a day (QID) | RECTAL | Status: DC | PRN
  Administered 2020-05-01 – 2020-05-02 (×2): 650 mg via RECTAL
  Filled 2020-04-30 (×2): qty 1

## 2020-04-30 MED ORDER — PIPERACILLIN-TAZOBACTAM 3.375 G IVPB 30 MIN
3.3750 g | Freq: Once | INTRAVENOUS | Status: AC
  Administered 2020-04-30: 3.375 g via INTRAVENOUS
  Filled 2020-04-30: qty 50

## 2020-04-30 MED ORDER — SODIUM CHLORIDE 0.9 % IV SOLN
2.0000 g | INTRAVENOUS | Status: DC
  Administered 2020-04-30: 2 g via INTRAVENOUS
  Filled 2020-04-30: qty 2

## 2020-04-30 MED ORDER — DEXTROSE-NACL 5-0.45 % IV SOLN: INTRAVENOUS | Status: DC

## 2020-04-30 MED ORDER — LACTATED RINGERS IV BOLUS (SEPSIS)
250.0000 mL | Freq: Once | INTRAVENOUS | Status: AC
  Administered 2020-04-30: 250 mL via INTRAVENOUS

## 2020-04-30 MED ORDER — DEXTROSE 5 % IV SOLN: INTRAVENOUS | Status: DC

## 2020-04-30 NOTE — Consult Note (Addendum)
WOC Nurse Consult Note: Reason for Consult: Consult requested for BLE.  Pt is emeciated and legs are contracted. Wound type: Right heel with pink dry scar tissue from a previous wound which has healed; in the center is a dark purple deep tissue pressure injury, .3X.3cm intact skin. Left heel with intact skin.   Left outer foot with dry yellow sloughtly raised callous; .5X.5cm, no open wound or drainage requiring topical treatment.  Left outer ankle with red, nonblanchable stage 1 pressure injury; .3X.3cm Pressure Injury POA: yes Dressing procedure/placement/frequency: Topical treatment orders provided for bedside nurses to perform as follows: Foam dressings to BLE, change Q 3 days or PRN soiling.  Float heels to reduce pressure. Please re-consult if further assistance is needed.  Thank-you,  Cammie Mcgee MSN, RN, CWOCN, [place], CNS [phone]

## 2020-04-30 NOTE — H&P (Addendum)
Date: 04/30/2020               Patient Name:  Travis Wallace MRN: [id number]  DOB: 05-22-1931 Age / Sex: 86 y.o., male   PCP: Karna Dupes, MD         Medical Service: Internal Medicine Teaching Service         Attending Physician: Dr. Oswaldo Done, Marquita Palms, *    First Contact: Dr. Marijo Conception Pager: [phone]  Second Contact: Dr. Mcarthur Rossetti Pager: [phone]       After Hours (After 5p/  First Contact Pager: [phone]  weekends / holidays): Second Contact Pager: [phone]   Chief Complaint: Shortness of breath  History of Present Illness:  Travis Wallace is an 86 yo M w/ Severe advanced dementia, CAD s/p bypass, BPH presenting to [facility] from [facility] due to respiratory distress. Travis Wallace is minimally responsive and is non-verbal. History obtained via daughter, [name], at bedside. She states that she visited him last Friday and was noted to be at baseline, which is non-verbal, minimally responsive, and sleeping most of the time. She received a call this morning from [facility] that due to respiratory distress he will be transitioned to [facility].  She mentions that Travis Wallace has been at [facility] for years due to his progressive dementia. Prior to the lock-down from COVID he was able to show some recognition when she visited and was able to verbalize non-comprehensible sounds but after not being able to visit during the pandemic and reduction in social interactions due to quarantine procedures at the nursing home, his dementia appears to have progressed and he is asleep most of the time. She mentions that he had a prior admission for sepsis from urinary source couple years ago where they had discussed transitioned to hospice but he recovered and he has not been sick or re-admitted since then.  Attempted to speak with [facility] staff but nurse familiar with him not available at this time.  Meds:  Current Meds  Medication Sig  . acetaminophen (TYLENOL) 325 MG tablet Take 2 tablets (650  mg total) by mouth every 6 (six) hours as needed for mild pain, moderate pain or fever.  Marland Kitchen acetaminophen (TYLENOL) 325 MG tablet Take 650 mg by mouth daily.  . Amino Acids-Protein Hydrolys (FEEDING SUPPLEMENT, PRO-STAT SUGAR FREE 64,) LIQD Take 30 mLs by mouth in the morning and at bedtime.  Marland Kitchen aspirin EC 81 MG tablet Take 81 mg by mouth daily.  . calcium carbonate (TUMS - DOSED IN MG ELEMENTAL CALCIUM) 500 MG chewable tablet Chew 1 tablet by mouth daily.  . divalproex (DEPAKOTE SPRINKLE) 125 MG capsule Take 125 mg by mouth at bedtime.   Marland Kitchen lactulose (CHRONULAC) 10 GM/15ML solution Take 30 mLs by mouth daily as needed for mild constipation or moderate constipation.   . psyllium (HYDROCIL/METAMUCIL) 95 % PACK Take 1 packet by mouth daily.  Marland Kitchen rOPINIRole (REQUIP) 1 MG tablet Take 1 mg by mouth at bedtime.  . senna (SENOKOT) 8.6 MG TABS tablet Take 1 tablet by mouth at bedtime.  . tamsulosin (FLOMAX) 0.4 MG CAPS capsule Take 1 capsule (0.4 mg total) by mouth daily after supper.    Allergies: Allergies as of 04/30/2020 - Review Complete 04/30/2020  Allergen Reaction Noted  . Memantine Other (See Comments) 12/21/2016   Past Medical History:  Diagnosis Date  . Acute gangrenous cholecystitis 12/19/2016   s/p lap cholecystectomy 12/19/16  . Acute kidney injury (HCC)   . Alzheimer's dementia   .  Alzheimer's dementia   . CAD (coronary artery disease) 12/17/2016  . Coronary artery disease   . Dementia 12/17/2016  . Depression   . Dyslipidemia   . GERD (gastroesophageal reflux disease)   . Hyperbilirubinemia   . Hypertension   . Insomnia   . OSA (obstructive sleep apnea) 12/17/2016  . Pericholecystic abscess 12/17/2016   s/p OR drainage 12/19/16  . Restless leg   . Severe sepsis (HCC) 12/17/2016  . Sinus bradycardia   . Sleep apnea    Family History:  Father passed from heart attack in the 94s. No other clinically relevant family history  Social History: Used to live in [place]. 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 HCPOA is daughter. Wife also has been having worsening dementia symptoms and is currently in [facility]. Has been in [facility] for ~3 years. Need total care at baseline.   Review of Systems: Unable to provide  Physical Exam: Blood pressure 138/67, pulse (!) 53, temperature 97.8 F (36.6 C), temperature source Rectal, resp. rate (!) 25, height 5\' 4"  (1.626 m), weight 68.5 kg, SpO2 93 %.  Gen: Chronically ill-appearing, Somnolent HEENT: NCAT head, Dry mucous membranes, PERRL Neck: supple, ROM intact, no JVD CV: Irregular rhythm, S1, S2 normal, No rubs, no murmurs, no gallops Pulm: CTAB, no rales, no wheezes Abd: Soft, BS+, NTND, No rebound, no guarding Extm: Significantly deconditioned bilateral lower extremities, well healed pressure ulcers on bilateral heels Skin: Dry, Warm, poor turgor, pressure ulcers pictured below Neuro: Minimally responsive. GCS 6  EKG: personally reviewed my interpretation is prolonged QT, significant wander, no obvious ischemic changes but need repeat EKG  CXR: personally reviewed my interpretation is rotated film, poor inspiratory film, no obvious lobar opacity  Assessment & Plan by Problem: Active Problems:   Sepsis (HCC)  Travis Wallace is an 86 yo M w/ advanced dementia admit for respiratory distress likely due to sepsis form urinary or pulmonary source.  Goals of Care Conversation Prolonged conversation with daughter at bedside who is HCPOA. Mentions that she would like to follow most recent MOST form recommendations signed by family which were reviewed and confirmed together indicating wish for trial of fluids and antibiotics in setting of acute illness. She mentions that her priority is lack of suffering for Travis Wallace but he has survived previous sepsis from urinary source and would like to hold off on transition to comfort care at this time  until his prognosis worsens. She understands that he is at high risk for decompensation. Palliative care consulted in ED - C/w fluids, antibiotics - Low threshold to transition to comfort care in setting of worsening clinical status (worsening tachycardia, hypotension, respiratory distress) - Appreciate palliative care team input - DNR/DNI  Acute hypoxic respiratory distress Sepsis likely due to pulmonary source Present with tachycardiac, tachypnea with increased oxygen requirement to 15L. Has hx of dysphagia not further worked up due to poor prognosis. Possibly had aspiration event. X-ray without obvious lobar opacity but poor quality. Also has previous hx of urinary sepsis which may be another source. Prior culture with pseudomonas. No UA available. Wbc 12.5. Lactate 5.6. Received 2.25 fluid bolus in ED and vanc / zosyn - D/c zosyn, replace with cefepime - F/u blood / urine cultures - C/w vanc - Monitor vitals, c/w pulse ox  Severe Dementia Currently minimally responsive at GCS 6 which is baseline per family. 'Just sleeps all the time' Mentions eating pureed diet at  [place] and wakes up intermittently. Would not want PEG. - Delirium precautions - Appreciate palliative care team input  Acute Kidney Injury Hypernatremia (likely acute on chronic) Admit Na 155, Cl 119, Bun 38, Creatinine 1.63. Baseline creatinine 0.7. No hx of CKD. Likely due to dehydration in setting of current infection, worsening dementia, poor oral intake. Calculated free water deficit 3.7L. Received 2.5L of LR in ED. Also received vanc/zosyn in ED. - Switch LR to D5W - Trend bmp - Avoid nephrotoxic meds when able.  DVT prophx: Lovenox Diet: NPO Bowel: Senokot Code: DNR/DNI  Prior to Admission Living Arrangement: Nursing home Anticipated Discharge Location: Nursing home Barriers to Discharge: Medical tx  Dispo: Admit patient to Inpatient with expected length of stay greater than 2 midnights.  Signed: Theotis Barrio, MD 04/30/2020, 12:47 PM  Pager: [phone]

## 2020-04-30 NOTE — Progress Notes (Addendum)
Pharmacy Antibiotic Note  Travis Wallace is a 86 y.o. male admitted on 04/30/2020 with pneumonia.  Pharmacy has been consulted for vancomycin dosing. She has also been placed on Zosyn. SCr 1.63, LA 4.6, WBC 12.5  Plan: -Vancomycin 1250 mg IV once, then start vancomycin 750 mg IV Q 24 hours  -F/u maintenance zosyn dosing -Monitor CBC, renal fx, cultures and clinical progress -VT at SS   Height: 5\' 4"  (162.6 cm) Weight: 68.5 kg (151 lb 0.2 oz) IBW/kg (Calculated) : 59.2  Temp (24hrs), Avg:97.8 F (36.6 C), Min:97.8 F (36.6 C), Max:97.8 F (36.6 C)  No results for input(s): WBC, CREATININE, LATICACIDVEN, VANCOTROUGH, VANCOPEAK, VANCORANDOM, GENTTROUGH, GENTPEAK, GENTRANDOM, TOBRATROUGH, TOBRAPEAK, TOBRARND, AMIKACINPEAK, AMIKACINTROU, AMIKACIN in the last 168 hours.  CrCl cannot be calculated (Patient's most recent lab result is older than the maximum 21 days allowed.).    Allergies  Allergen Reactions  . Memantine Other (See Comments)    Reaction:  Bradycardia  Other reaction(s): Other (See Comments) Other reaction(s): Other (See Comments) Reaction:Bradycardia  Reaction:  Bradycardia      Antimicrobials this admission: Vanc 11/1 >>  Zosyn 11/1 >>   Dose adjustments this admission:  Microbiology results: 11/1 BCx:  11/1 UCx:    Thank you for allowing pharmacy to be a part of this patient's care.  13/1, PharmD., BCPS, BCCCP Clinical Pharmacist Please refer to [facility] for unit-specific pharmacist   Addendum: Now adding cefepime for maintenance gram negative coverage. Will start Cefepime 2 gm IV Q 24 hours.   [facility], PharmD., BCPS, BCCCP Clinical Pharmacist Please refer to [facility] for unit-specific pharmacist

## 2020-04-30 NOTE — ED Triage Notes (Signed)
Pt from [facility]; staff called EMS to report respiratory distress.  Pt in distress on arrival; ED doc at bedside.

## 2020-04-30 NOTE — ED Notes (Signed)
Wound care at bedside.

## 2020-04-30 NOTE — Progress Notes (Signed)
Patient arrived the unit form the ED on a stretcher , assessment completed see flow sheet, placed on tele ccmd notified, Amonsah MD notified of MEWS score 3, patient responsive to pain, no sigbn of distress noted, SR with PAC on tele, bed in lowest position call bell within reach, bed alarm activated will continue to monitor.

## 2020-04-30 NOTE — Consult Note (Signed)
Consultation Note Date: 04/30/2020   Patient Name: Travis Wallace  DOB: 06/13/1931  MRN: [id number]  Age / Sex: 86 y.o., male  PCP: Raymondo Band, MD Referring Physician: Elnora Morrison, MD  Reason for Consultation: Establishing goals of care  HPI/Patient Profile: 86 y.o. male  with past medical history of Alzheimer's dementia, HTN, CAD, and OSA. He presented to [facility] emergency department on 04/30/2020 with respiratory distress.  ED Course: Patient initially placed on 15L NRB. Lactic acid 4.6-->5.6, creatinine 1.63  Primary decision maker: Roswell Miners (daughter and HCPOA) [phone]  Clinical Assessment and Goals of Care: I have reviewed medical records including EPIC notes, labs and imaging, examined the patient and met at bedside with daughter [name])  to discuss diagnosis, prognosis, GOC, EOL wishes, disposition, and options.  Patient is minimally responsive. Current oxygen requirement is 5L via nasal cannula.   I introduced Palliative Medicine as specialized medical care for people living with serious illness. It focuses on providing relief from the symptoms and stress of a serious illness.   We discussed a brief life review of the patient. He has been diagnosed with dementia for about 10 years. His wife is still living - she also has dementia and now lives at [facility]. There are 2 adult children. [name] is HCPOA and most involved with her parent's care. She describes a decline in her father's cognitive and functional status after a fall requiring hip replacement in 2018. He has resided at [facility] since then. At baseline, he is minimally verbal, non-ambulatory with contractures of both lower extremities, and requires total care. [name] reports that he mostly sleeps all day. However his appetite has remained fair and that staff has told her in the past "he loves to eat". She does mention that staffing  has been increasingly inconsistent and she has a difficult time obtaining report on how he is doing.   We discussed his current illness and what it means in the larger context of his ongoing co-morbidities.  Detailed discussion was had regarding the diagnosis of dementia and its natural trajectory. This includes decreased ability to communicate, ambulate, swallow, and maintain continence. [name] has a good understanding of the trajectory of dementia.  I provided education on patient's current medical condition - he is currently being treated for sepsis with source of urinary versus respiratory infection. Discussed that he is at high risk for decompensation due to his multiple underlying conditions. [name] has a good understanding of his current condition and is aware that he may not recover from this acute illness.   Advanced directives, concepts specific to code status, artifical feeding and hydration, and rehospitalization were considered and discussed. [name] has completed MOST forms in the past and several are at bedside with other paperwork from the [name]. I reviewed these with [name] and she confirms DNR/DNI, time trial of antibiotics and IV fluids, and no feeding tube.   I reviewed the concept of a comfort path with [name] and discussed at what point she would want to focus purely on  comfort and dignity rather than prolonging life. Discussed that a time trial of 24 hours for antibiotics and IV fluids is reasonable. Introduced hospice philosophy and information on home vs residential hospice services - answered all questions.   Questions and concerns were addressed. PMT contact info/card was provided.    SUMMARY OF RECOMMENDATIONS   - DNR/DNI  - continue current supportive medical care - Discussed time trial of 24 hours for antibiotics and IV fluids - [name] is open to transition to comfort care if there is no improvement or in the event of further decline - [name] is open to hospice care upon return  to [facility] will follow-up tomorrow  Code Status/Advance Care Planning:  DNR (present on admission)  Symptom Management:   Per primary team  Palliative Prophylaxis:   Aspiration, Oral Care and Turn Reposition  Additional Recommendations (Limitations, Scope, Preferences):  No Artificial Feeding  Psycho-social/Spiritual:   Created space and opportunity for patient and family to express thoughts and feelings regarding patient's current medical situation.   Emotional support provided   Prognosis:   poor  Discharge Planning: To Be Determined      Primary Diagnoses: Present on Admission: **None**   I have reviewed the medical record, interviewed the patient and family, and examined the patient. The following aspects are pertinent.  Past Medical History:  Diagnosis Date  . Acute gangrenous cholecystitis 12/19/2016   s/p lap cholecystectomy 12/19/16  . Acute kidney injury ([place])   . Alzheimer's dementia   . Alzheimer's dementia   . CAD (coronary artery disease) 12/17/2016  . Coronary artery disease   . Dementia 12/17/2016  . Depression   . Dyslipidemia   . GERD (gastroesophageal reflux disease)   . Hyperbilirubinemia   . Hypertension   . Insomnia   . OSA (obstructive sleep apnea) 12/17/2016  . Pericholecystic abscess 12/17/2016   s/p OR drainage 12/19/16  . Restless leg   . Severe sepsis ([place]) 12/17/2016  . Sinus bradycardia   . Sleep apnea      Family History  Problem Relation Age of Onset  . Pancreatic cancer Mother   . Hypertension Father   . CAD Father   . Stroke Father   . CAD Sister   . Diabetes Neg Hx    Scheduled Meds: Continuous Infusions: . lactated ringers 150 mL/hr at 04/30/20 0954  . [START ON 05/01/2020] vancomycin     PRN Meds:. Medications Prior to Admission:  Prior to Admission medications   Medication Sig Start Date End Date Taking? Authorizing Provider  acetaminophen (TYLENOL) 325 MG tablet Take 2 tablets (650 mg total)  by mouth every 6 (six) hours as needed for mild pain, moderate pain or fever. 12/25/16   Hongalgi, Lenis Dickinson, MD  aspirin EC 81 MG tablet Take 81 mg by mouth daily.    [provider]  Baclofen 5 MG TABS Take 5 mg by mouth 3 (three) times daily. 08/20/17   [provider]  ciprofloxacin (CIPRO) 250 MG tablet Take 1 tablet (250 mg total) by mouth 2 (two) times daily. 09/05/17   Roxan Hockey, MD  divalproex (DEPAKOTE SPRINKLE) 125 MG capsule Take 125-250 mg by mouth See admin instructions. Take 125 mg once during the day for mood stabilization, and take 250 mg by mouth at bedtime for dementia 07/23/17   [provider]  doxazosin (CARDURA) 4 MG tablet Take 4 mg by mouth daily.    [provider]  lactulose (CHRONULAC) 10 GM/15ML solution Take  30 mLs by mouth daily as needed for mild constipation or moderate constipation.     [provider]  omeprazole (PRILOSEC) 20 MG capsule Take 20 mg by mouth daily. 08/05/17   [provider]  psyllium (HYDROCIL/METAMUCIL) 95 % PACK Take 1 packet by mouth daily. 12/26/16   Hongalgi, Lenis Dickinson, MD  QUEtiapine (SEROQUEL) 25 MG tablet Take 1 tablet (25 mg total) by mouth 2 (two) times daily. Patient taking differently: Take 50 mg by mouth at bedtime.  02/15/17   Ethelene Hal, NP  rOPINIRole (REQUIP) 1 MG tablet Take 1 mg by mouth at bedtime.    [provider]  tamsulosin (FLOMAX) 0.4 MG CAPS capsule Take 1 capsule (0.4 mg total) by mouth daily after supper. 09/05/17   Roxan Hockey, MD   Allergies  Allergen Reactions  . Memantine Other (See Comments)    Reaction:  Bradycardia  Other reaction(s): Other (See Comments) Other reaction(s): Other (See Comments) Reaction:Bradycardia  Reaction:  Bradycardia   Other reaction(s): Other (See Comments) Reaction: Bradycardia    Review of Systems  Unable to perform ROS: Dementia    Physical Exam Vitals reviewed.  Constitutional:      Appearance: He  is ill-appearing.     Comments: somnolent  Cardiovascular:     Rate and Rhythm: Normal rate. Rhythm irregular.  Pulmonary:     Effort: Pulmonary effort is normal.  Neurological:     Comments: Minimally responsive     Vital Signs: BP (!) 142/67   Pulse (!) 54   Temp 97.8 F (36.6 C) (Rectal)   Resp (!) 27   Ht 5' 4"  (1.626 m)   Wt 68.5 kg   SpO2 93%   BMI 25.92 kg/m          SpO2: SpO2: 93 % O2 Device:SpO2: 93 % O2 Flow Rate: .O2 Flow Rate (L/min): 6 L/min  IO: Intake/output summary:   Intake/Output Summary (Last 24 hours) at 04/30/2020 1132 Last data filed at 04/30/2020 1007 Gross per 24 hour  Intake 2500 ml  Output --  Net 2500 ml    LBM:   Baseline Weight: Weight: 68.5 kg Most recent weight: Weight: 68.5 kg      Palliative Assessment/Data: PPS 10%     Time In: 13:40 Time Out: 14:36 Time Total: 56 minutes Greater than 50%  of this time was spent counseling and coordinating care related to the above assessment and plan.  Signed by: Lavena Bullion, NP   Please contact Palliative Medicine Team phone at [phone] for questions and concerns.  For individual provider: See Shea Evans

## 2020-04-30 NOTE — ED Notes (Signed)
Date and time results received: 04/30/20 8:19  Test: lactic Critical Value: 4.6  Name of Provider Notified: Nunzio Cory, MD

## 2020-04-30 NOTE — ED Notes (Addendum)
Called to floor spoke with RN, awaiting floor to return call.

## 2020-04-30 NOTE — ED Provider Notes (Signed)
ATTENDING SUPERVISORY NOTE I have personally viewed the imaging studies performed. I have personally seen and examined the patient, and discussed the plan of care with the resident.  I have reviewed the documentation of the resident and agree.  No diagnosis found.  .Critical Care Performed by: Blane Ohara, MD Authorized by: Blane Ohara, MD   Critical care provider statement:    Critical care time (minutes):  35   Critical care start time:  04/30/2020 7:30 AM   Critical care end time:  04/30/2020 8:05 AM   Critical care time was exclusive of:  Separately billable procedures and treating other patients and teaching time   Critical care was necessary to treat or prevent imminent or life-threatening deterioration of the following conditions:  Respiratory failure and sepsis   Critical care was time spent personally by me on the following activities:  Evaluation of patient's response to treatment, examination of patient, ordering and performing treatments and interventions, ordering and review of laboratory studies, ordering and review of radiographic studies, pulse oximetry, re-evaluation of patient's condition, obtaining history from patient or surrogate and review of old charts      Blane Ohara, MD 05/02/20 1410

## 2020-04-30 NOTE — ED Notes (Signed)
Pt presented to ecd with respiratory distress on 15L NRB at 95%. Patient non responsive to painful or verbal stimuli. Patient coming from nursing facility and is DNR.

## 2020-04-30 NOTE — Progress Notes (Signed)
Notified provider of need to draw repeat lactic acid @ 1200, also made bedside RN aware.

## 2020-04-30 NOTE — Progress Notes (Signed)
RT provided oral care and suctioning.

## 2020-04-30 NOTE — ED Notes (Signed)
Lab called stating cbc and PT tubes are clotted and need clotted.

## 2020-04-30 NOTE — Progress Notes (Signed)
Following for code sepsis 

## 2020-04-30 NOTE — ED Provider Notes (Signed)
[facility] EMERGENCY DEPARTMENT Provider Note   CSN: [id number] Arrival date & time: 04/30/20  5009     History Chief Complaint  Patient presents with  . Respiratory Distress    Travis Wallace is a 86 y.o. male.  The history is provided by the nursing home.  Shortness of Breath Severity:  Severe Onset quality:  Unable to specify Timing:  Unable to specify Progression:  Worsening Chronicity:  New Relieved by:  None tried Ineffective treatments:  None tried Risk factors: prolonged immobilization   Risk factors: no obesity, no recent surgery and no tobacco use        Past Medical History:  Diagnosis Date  . Acute gangrenous cholecystitis 12/19/2016   s/p lap cholecystectomy 12/19/16  . Acute kidney injury (HCC)   . Alzheimer's dementia   . Alzheimer's dementia   . CAD (coronary artery disease) 12/17/2016  . Coronary artery disease   . Dementia 12/17/2016  . Depression   . Dyslipidemia   . GERD (gastroesophageal reflux disease)   . Hyperbilirubinemia   . Hypertension   . Insomnia   . OSA (obstructive sleep apnea) 12/17/2016  . Pericholecystic abscess 12/17/2016   s/p OR drainage 12/19/16  . Restless leg   . Severe sepsis (HCC) 12/17/2016  . Sinus bradycardia   . Sleep apnea     Patient Active Problem List   Diagnosis Date Noted  . Palliative care patient 11/03/2017  . Adult failure to thrive   . Palliative care by specialist   . Terminal care   . Complicated UTI (urinary tract infection) 08/28/2017  . Prolonged Q-T interval on ECG 08/28/2017  . Dysphagia 08/28/2017  . Right sided weakness 08/28/2017  . Lactic acidosis 12/27/2016  . HLD (hyperlipidemia) 12/27/2016  . Pericholecystic abscess s/p OR drainage 12/19/2016 12/21/2016  . Delirium 12/21/2016  . Alzheimer's dementia (HCC)   . Hypertension   . GERD (gastroesophageal reflux disease)   . Sleep apnea   . Restless leg   . Insomnia   . Sinus bradycardia 12/19/2016  .  Hyperbilirubinemia 12/19/2016  . AKI (acute kidney injury) (HCC) 12/19/2016  . Altered mental status   . Cholecystitis   . Acute gangrenous cholecystitis s/p lap cholecystectomy 12/19/2016 12/17/2016  . Cholangitis 12/17/2016  . Severe sepsis (HCC) 12/17/2016  . CAD (coronary artery disease) 12/17/2016  . HTN (hypertension) 12/17/2016  . OSA (obstructive sleep apnea) 12/17/2016  . Sepsis (HCC) 12/17/2016  . Dementia with aggressive behavior (HCC) 12/17/2016  . Pericholecystic abscess 12/17/2016    Past Surgical History:  Procedure Laterality Date  . BACK SURGERY     x2  . CARDIAC CATHETERIZATION    . CATARACT EXTRACTION, BILATERAL    . CHOLECYSTECTOMY N/A 12/19/2016   Procedure: LAPAROSCOPIC CHOLECYSTECTOMY WITH INTRAOPERATIVE CHOLANGIOGRAM;  Surgeon: Ovidio Kin, MD;  Location: [facility];  Service: General;  Laterality: N/A;  . CORONARY ARTERY BYPASS GRAFT     X 3 VESSELS  . EYE SURGERY         Family History  Problem Relation Age of Onset  . Pancreatic cancer Mother   . Hypertension Father   . CAD Father   . Stroke Father   . CAD Sister   . Diabetes Neg Hx     Social History   Tobacco Use  . Smoking status: Never Smoker  . Smokeless tobacco: Never Used  Vaping Use  . Vaping Use: Never used  Substance Use Topics  . Alcohol use: No  . Drug use: No  Home Medications Prior to Admission medications   Medication Sig Start Date End Date Taking? Authorizing Provider  acetaminophen (TYLENOL) 325 MG tablet Take 2 tablets (650 mg total) by mouth every 6 (six) hours as needed for mild pain, moderate pain or fever. 12/25/16  Yes Hongalgi, Maximino GreenlandAnand D, MD  acetaminophen (TYLENOL) 325 MG tablet Take 650 mg by mouth daily.   Yes [provider]  Amino Acids-Protein Hydrolys (FEEDING SUPPLEMENT, PRO-STAT SUGAR FREE 64,) LIQD Take 30 mLs by mouth in the morning and at bedtime.   Yes [provider]  aspirin EC 81 MG tablet Take 81 mg by mouth daily.   Yes  [provider]  calcium carbonate (TUMS - DOSED IN MG ELEMENTAL CALCIUM) 500 MG chewable tablet Chew 1 tablet by mouth daily.   Yes [provider]  divalproex (DEPAKOTE SPRINKLE) 125 MG capsule Take 125 mg by mouth at bedtime.  07/23/17  Yes [provider]  lactulose (CHRONULAC) 10 GM/15ML solution Take 30 mLs by mouth daily as needed for mild constipation or moderate constipation.    Yes [provider]  psyllium (HYDROCIL/METAMUCIL) 95 % PACK Take 1 packet by mouth daily. 12/26/16  Yes Hongalgi, Maximino GreenlandAnand D, MD  rOPINIRole (REQUIP) 1 MG tablet Take 1 mg by mouth at bedtime.   Yes [provider]  senna (SENOKOT) 8.6 MG TABS tablet Take 1 tablet by mouth at bedtime.   Yes [provider]  tamsulosin (FLOMAX) 0.4 MG CAPS capsule Take 1 capsule (0.4 mg total) by mouth daily after supper. 09/05/17  Yes Emokpae, Courage, MD  QUEtiapine (SEROQUEL) 25 MG tablet Take 1 tablet (25 mg total) by mouth 2 (two) times daily. Patient not taking: Reported on 04/30/2020 02/15/17   Laveda AbbeParks, Laurie Britton, NP    Allergies    Memantine  Review of Systems   Review of Systems  Unable to perform ROS: Dementia    Physical Exam Updated Vital Signs BP 138/67   Pulse (!) 53   Temp 97.8 F (36.6 C) (Rectal)   Resp (!) 25   Ht 5\' 4"  (1.626 m)   Wt 68.5 kg   SpO2 93%   BMI 25.92 kg/m   Physical Exam Vitals reviewed.  Constitutional:      General: He is in acute distress.     Appearance: He is ill-appearing.  HENT:     Head: Normocephalic and atraumatic.     Nose: Nose normal.  Eyes:     Conjunctiva/sclera: Conjunctivae normal.  Cardiovascular:     Rate and Rhythm: Tachycardia present.  Pulmonary:     Effort: Respiratory distress present.     Breath sounds: Wheezing and rhonchi present.  Abdominal:     General: Abdomen is flat.     Palpations: Abdomen is soft.  Musculoskeletal:     Right lower leg: No edema.     Left lower leg: No edema.    Neurological:     Mental Status: He is unresponsive.     Cranial Nerves: No facial asymmetry.     Motor: No tremor.  Psychiatric:        Speech: He is noncommunicative.     ED Results / Procedures / Treatments   Labs (all labs ordered are listed, but only abnormal results are displayed) Labs Reviewed  BLOOD GAS, VENOUS - Abnormal; Notable for the following components:      Result Value   pCO2, Ven 37.2 (*)    pO2, Ven 72.1 (*)    Acid-base  deficit 3.1 (*)    All other components within normal limits  LACTIC ACID, PLASMA - Abnormal; Notable for the following components:   Lactic Acid, Venous 4.6 (*)    All other components within normal limits  LACTIC ACID, PLASMA - Abnormal; Notable for the following components:   Lactic Acid, Venous 5.6 (*)    All other components within normal limits  BASIC METABOLIC PANEL - Abnormal; Notable for the following components:   Sodium 155 (*)    Chloride 119 (*)    CO2 20 (*)    Glucose, Bld 187 (*)    BUN 38 (*)    Creatinine, Ser 1.63 (*)    Calcium 8.6 (*)    GFR, Estimated 41 (*)    Anion gap 16 (*)    All other components within normal limits  CBC WITH DIFFERENTIAL/PLATELET - Abnormal; Notable for the following components:   WBC 12.5 (*)    HCT 55.7 (*)    Neutro Abs 10.4 (*)    Lymphs Abs 0.6 (*)    Monocytes Absolute 1.4 (*)    All other components within normal limits  PROTIME-INR - Abnormal; Notable for the following components:   Prothrombin Time 17.0 (*)    INR 1.4 (*)    All other components within normal limits  LACTIC ACID, PLASMA - Abnormal; Notable for the following components:   Lactic Acid, Venous 6.0 (*)    All other components within normal limits  CBG MONITORING, ED - Abnormal; Notable for the following components:   Glucose-Capillary 198 (*)    All other components within normal limits  RESPIRATORY PANEL BY RT PCR (FLU A&B, COVID)  CULTURE, BLOOD (ROUTINE X 2)  CULTURE, BLOOD (ROUTINE X 2)  URINE CULTURE   PROTIME-INR  CBC WITH DIFFERENTIAL/PLATELET  URINALYSIS, ROUTINE W REFLEX MICROSCOPIC  LACTIC ACID, PLASMA  PROCALCITONIN  TROPONIN I (HIGH SENSITIVITY)  TROPONIN I (HIGH SENSITIVITY)    EKG EKG Interpretation  Date/Time:  Monday April 30 2020 06:45:42 EDT Ventricular Rate:  142 PR Interval:    QRS Duration: 142 QT Interval:  341 QTC Calculation: 519 R Axis:   -55 Text Interpretation: Extreme tachycardia with wide complex, no further rhythm analysis attempted Baseline wander Confirmed by Blane Ohara [id number]) on 04/30/2020 7:14:03 AM   Radiology DG Chest Portable 1 View  Result Date: 04/30/2020 CLINICAL DATA:  Shortness of breath. EXAM: PORTABLE CHEST 1 VIEW COMPARISON:  August 28, 2017. FINDINGS: Stable cardiomegaly. No pneumothorax is noted. Hypoinflation of the lungs is noted with minimal bibasilar subsegmental atelectasis. Bony thorax is unremarkable. No effusion is noted. IMPRESSION: Hypoinflation of the lungs with minimal bibasilar subsegmental atelectasis. Electronically Signed   By: Lupita Raider M.D.   On: 04/30/2020 08:10    Procedures Procedures (including critical care time)  Medications Ordered in ED Medications  vancomycin (VANCOREADY) IVPB 750 mg/150 mL (has no administration in time range)  enoxaparin (LOVENOX) injection 30 mg (30 mg Subcutaneous Given 04/30/20 1305)  acetaminophen (TYLENOL) tablet 650 mg (has no administration in time range)    Or  acetaminophen (TYLENOL) suppository 650 mg (has no administration in time range)  ceFEPIme (MAXIPIME) 2 g in sodium chloride 0.9 % 100 mL IVPB (has no administration in time range)  dextrose 5 % solution ( Intravenous Rate/Dose Change 04/30/20 1315)  lactated ringers bolus 1,000 mL (0 mLs Intravenous Stopped 04/30/20 0831)    And  lactated ringers bolus 1,000 mL (0 mLs Intravenous Stopped 04/30/20 0946)  And  lactated ringers bolus 250 mL (0 mLs Intravenous Stopped 04/30/20 1007)  piperacillin-tazobactam  (ZOSYN) IVPB 3.375 g (0 g Intravenous Stopped 04/30/20 0808)  vancomycin (VANCOREADY) IVPB 1250 mg/250 mL (0 mg Intravenous Stopped 04/30/20 0914)    ED Course  I have reviewed the triage vital signs and the nursing notes.  Pertinent labs & imaging results that were available during my care of the patient were reviewed by me and considered in my medical decision making (see chart for details).    MDM Rules/Calculators/A&P                          Medical Decision Making: Leamon Palau is a 86 y.o. male who presented to the ED today with shortness of breath. Pt presented from [name] [facility]) by EMS with SOB, unclear duration or onset. Pt has severe dementia and is non communicative at baseline per EMS report.   Past medical history significant for Severe advanced dementia, CAD s/p bypass, BPH  Reviewed and confirmed nursing documentation for past medical history, family history, social history.  On my initial exam, the pt is in respiratory distress, with significant increased WOB, tachypnea, audible rhonchi.  Pt on 15L non-rebreather, sating 95%.  Pt came with MOST form, does not desire intubation. DNR/DNI.   Pt has no known COPD or CHF per chart review.    Pt is high risk aspiration given advanced dementia, and with current presentation of acute respiratory distress there is high concern for respiratory infection.  Elevated Lactic 4.6 --> 5.6 Code sepsis called, blood cultures obtained x2, fluids and empiric abx given for pneumonia.   Pt daughter and legal guardian [name] contacted at home, notified of the severity of pt condition, discussed and confirmed the content of pt MOST form, encouraged to come to the hospital as COVID negative.   Consults: Palliative care consulted  All radiology and laboratory studies reviewed independently and with my attending physician, agree with reading provided by radiologist unless otherwise noted.   Upon reassessing patient, patient was  breathing more comfortably, transitioned to [place] 6L with appropriate saturations ~93%.   Based on the above findings, I believe patient requires admission.  Dispo:  Admitted to Internal Medicine   The above care was discussed with and agreed upon by my attending physician. Emergency Department Medication Summary:  Medications  vancomycin (VANCOREADY) IVPB 750 mg/150 mL (has no administration in time range)  enoxaparin (LOVENOX) injection 30 mg (30 mg Subcutaneous Given 04/30/20 1305)  acetaminophen (TYLENOL) tablet 650 mg (has no administration in time range)    Or  acetaminophen (TYLENOL) suppository 650 mg (has no administration in time range)  ceFEPIme (MAXIPIME) 2 g in sodium chloride 0.9 % 100 mL IVPB (has no administration in time range)  dextrose 5 % solution ( Intravenous Rate/Dose Change 04/30/20 1315)  lactated ringers bolus 1,000 mL (0 mLs Intravenous Stopped 04/30/20 0831)    And  lactated ringers bolus 1,000 mL (0 mLs Intravenous Stopped 04/30/20 0946)    And  lactated ringers bolus 250 mL (0 mLs Intravenous Stopped 04/30/20 1007)  piperacillin-tazobactam (ZOSYN) IVPB 3.375 g (0 g Intravenous Stopped 04/30/20 0808)  vancomycin (VANCOREADY) IVPB 1250 mg/250 mL (0 mg Intravenous Stopped 04/30/20 0914)       Final Clinical Impression(s) / ED Diagnoses Final diagnoses:  Respiratory distress    Rx / DC Orders ED Discharge Orders    None       Brantley Fling,  MD 04/30/20 1337    Blane Ohara, MD 05/02/20 1410

## 2020-04-30 NOTE — ED Notes (Signed)
Palliative at bedside

## 2020-04-30 NOTE — Progress Notes (Signed)
Notified bedside nurse of need to draw repeat lactic acid. 

## 2020-05-01 DIAGNOSIS — F0391 Unspecified dementia with behavioral disturbance: Secondary | ICD-10-CM

## 2020-05-01 DIAGNOSIS — N179 Acute kidney failure, unspecified: Secondary | ICD-10-CM

## 2020-05-01 DIAGNOSIS — A419 Sepsis, unspecified organism: Principal | ICD-10-CM

## 2020-05-01 DIAGNOSIS — E870 Hyperosmolality and hypernatremia: Secondary | ICD-10-CM | POA: Diagnosis present

## 2020-05-01 LAB — CBC
HCT: 50.1 % (ref 39.0–52.0)
Hemoglobin: 15.5 g/dL (ref 13.0–17.0)
MCH: 30.6 pg (ref 26.0–34.0)
MCHC: 30.9 g/dL (ref 30.0–36.0)
MCV: 98.8 fL (ref 80.0–100.0)
Platelets: 151 10*3/uL (ref 150–400)
RBC: 5.07 MIL/uL (ref 4.22–5.81)
RDW: 14 % (ref 11.5–15.5)
WBC: 15.9 10*3/uL — ABNORMAL HIGH (ref 4.0–10.5)
nRBC: 0 % (ref 0.0–0.2)

## 2020-05-01 LAB — COMPREHENSIVE METABOLIC PANEL
ALT: 72 U/L — ABNORMAL HIGH (ref 0–44)
AST: 39 U/L (ref 15–41)
Albumin: 2.7 g/dL — ABNORMAL LOW (ref 3.5–5.0)
Alkaline Phosphatase: 69 U/L (ref 38–126)
Anion gap: 12 (ref 5–15)
BUN: 24 mg/dL — ABNORMAL HIGH (ref 8–23)
CO2: 23 mmol/L (ref 22–32)
Calcium: 8.6 mg/dL — ABNORMAL LOW (ref 8.9–10.3)
Chloride: 117 mmol/L — ABNORMAL HIGH (ref 98–111)
Creatinine, Ser: 1.03 mg/dL (ref 0.61–1.24)
GFR, Estimated: >60 mL/min (ref >60)
Glucose, Bld: 126 mg/dL — ABNORMAL HIGH (ref 70–99)
Potassium: 3.6 mmol/L (ref 3.5–5.1)
Sodium: 152 mmol/L — ABNORMAL HIGH (ref 135–145)
Total Bilirubin: 1.5 mg/dL — ABNORMAL HIGH (ref 0.3–1.2)
Total Protein: 6.4 g/dL — ABNORMAL LOW (ref 6.5–8.1)

## 2020-05-01 LAB — URINE CULTURE: Culture: NO GROWTH

## 2020-05-01 LAB — LACTIC ACID, PLASMA
Lactic Acid, Venous: 1.3 mmol/L (ref 0.5–1.9)
Lactic Acid, Venous: 1.3 mmol/L (ref 0.5–1.9)

## 2020-05-01 MED ORDER — GLYCOPYRROLATE 0.2 MG/ML IJ SOLN
0.2000 mg | INTRAMUSCULAR | Status: DC | PRN
  Filled 2020-05-01: qty 1

## 2020-05-01 MED ORDER — GLYCOPYRROLATE 0.2 MG/ML IJ SOLN
0.2000 mg | INTRAMUSCULAR | Status: DC | PRN
  Administered 2020-05-01 – 2020-05-02 (×4): 0.2 mg via INTRAVENOUS
  Filled 2020-05-01 (×3): qty 1

## 2020-05-01 MED ORDER — ENOXAPARIN SODIUM 40 MG/0.4ML ~~LOC~~ SOLN: 40.0000 mg | Freq: Every day | SUBCUTANEOUS | Status: DC

## 2020-05-01 MED ORDER — LORAZEPAM 1 MG PO TABS: 1.0000 mg | ORAL_TABLET | ORAL | Status: DC | PRN

## 2020-05-01 MED ORDER — GLYCOPYRROLATE 1 MG PO TABS
1.0000 mg | ORAL_TABLET | ORAL | Status: DC | PRN
  Filled 2020-05-01: qty 1

## 2020-05-01 MED ORDER — POLYVINYL ALCOHOL 1.4 % OP SOLN
1.0000 [drp] | Freq: Four times a day (QID) | OPHTHALMIC | Status: DC | PRN
  Filled 2020-05-01: qty 15

## 2020-05-01 MED ORDER — MORPHINE SULFATE (PF) 2 MG/ML IV SOLN
1.0000 mg | INTRAVENOUS | Status: DC | PRN
  Administered 2020-05-02 (×3): 1 mg via INTRAVENOUS
  Filled 2020-05-01 (×4): qty 1

## 2020-05-01 MED ORDER — LORAZEPAM 2 MG/ML IJ SOLN: 1.0000 mg | INTRAMUSCULAR | Status: DC | PRN

## 2020-05-01 MED ORDER — DEXTROSE-NACL 5-0.45 % IV SOLN: INTRAVENOUS | Status: DC

## 2020-05-01 MED ORDER — VANCOMYCIN HCL IN DEXTROSE 1-5 GM/200ML-% IV SOLN
1000.0000 mg | INTRAVENOUS | Status: DC
  Administered 2020-05-01: 1000 mg via INTRAVENOUS
  Filled 2020-05-01: qty 200

## 2020-05-01 MED ORDER — LORAZEPAM 2 MG/ML PO CONC: 1.0000 mg | ORAL | Status: DC | PRN

## 2020-05-01 MED ORDER — BIOTENE DRY MOUTH MT LIQD: 15.0000 mL | OROMUCOSAL | Status: DC | PRN

## 2020-05-01 NOTE — Progress Notes (Signed)
Patient MEWs score was a red 7. Patient's daughter at bedside. Patient was unresponsive to voice or touch.  See Flowsheet for v/s and MEWS intervention. Dr. Marijo Conception notified. No new orders. Tylenol supp. Given per order. When patient cleaned and suppository given patient did moan. Dr. Marijo Conception states he will be up to see patient and patient's daughter later today.

## 2020-05-01 NOTE — Progress Notes (Signed)
   Subjective:  Patient was evaluated at bedside this AM. Somnolent, minimally responsive. Per chart review, appears similar to baseline. No acute events overnight.   Objective:  Vital signs in last 24 hours: Vitals:   05/01/20 0300 05/01/20 0400 05/01/20 0500 05/01/20 0603  BP: 138/67 136/77 (!) 141/72 134/65  Pulse: 90 95 97 97  Resp: (!) 25 (!) 34 (!) 36 (!) 31  Temp: 98.6 F (37 C)   98.5 F (36.9 C)  TempSrc: Axillary   Oral  SpO2: 95% 93% 93% 93%  Weight:      Height:       Physical Exam: General: Chronically ill-appearing, diaphoretic CV: Tachycardic, regular rhythm. No m/r/g Pulm: Rales bilaterally Neuro: Somnolent, gag reflex present  Assessment/Plan: Travis Wallace is 86yo male with severe dementia, CAD s/p CABG, R hip fracture admitted 11/1 with sepsis and respiratory distress.  Active Problems:   Sepsis (HCC)  #Sepsis 2/2 pulmonary source #Acute hypoxic respiratory distress On arrival patient afebrile, tachycardic, tachypneic initially requiring 15L O2. Lactate 5.6>3.7>1.6. Poor chest x-ray, no obvious lobar opacity. Patient has hx of aspiration events without further work-up given other co-morbidities.  Patient received 2.25L IVF in ED and started on vanc/zosyn, which has been replaced with vanc/cefepime. No events overnight, patient increasingly tachypneic to mid-30's, sating well on 5L [place]. Blood pressure at goal. 1/4 BCX GPR, likely contamination. Will continue abx and IVF. - C/w vanc, cefepime - Adequately perfusing, continue to monitor vitals  - F/u BCx, UCx  #Goals of Care #Severe dementia Admitting MD yesterday discussed case with daughter, who is HCPOA. At baseline patient is minimally responsive at GCS 6. Lives at [facility] currently. Yesterday it that time it was decided to treat Mr. Travis Wallace for 1-2 days and re-assess clinical picture. Lactic acidosis has resolved, but patient febrile and tachypneic to 37. Will have discussion with daughter this afternoon  regarding next steps. Appreciate palliative consult for their assistance - Pending discussion with daughter - Low threshold to transition to comfort care given worsening clinical status - DNR/DNI  #Acute Kidney Injury On arrival Cr 1.63, BL Cr 0.7. Cr improving with fluids, 1.03 today. Most likely AKI pre-renal in setting of dehydration, worsening po intake. - C/w IVF - F/u BMP  #Hypernatremia On arrival Na 155 > 152 this AM. Originally received LR in ED 2/2 hypovolemia. FWD on arrival 3.5L. - Start 1/2NS 165mL/hr - Trend Na  DIET: NPO IVF: 1/2 NS DVT PPX: Lovenox BOWEL: n/a CODE: DNR/DNI  Prior to Admission Living Arrangement: [facility] Anticipated Discharge Location: Hospice vs hospital death Barriers to Discharge: medical management Dispo: Anticipated discharge in approximately 1-2 day(s).   Evlyn Kanner, MD 05/01/2020, 6:11 AM Pager: [phone] After 5pm on weekdays and 1pm on weekends: On Call pager [phone]

## 2020-05-01 NOTE — Progress Notes (Signed)
°   05/01/20 0603  Assess: MEWS Score  Temp 98.5 F (36.9 C)  BP 134/65  Pulse Rate 97  ECG Heart Rate 100  Resp (!) 31  Level of Consciousness Responds to Pain  SpO2 93 %  O2 Device Nasal Cannula  O2 Flow Rate (L/min) 5 L/min  Assess: MEWS Score  MEWS Temp 0  MEWS Systolic 0  MEWS Pulse 0  MEWS RR 2  MEWS LOC 2  MEWS Score 4  MEWS Score Color Red  Assess: if the MEWS score is Yellow or Red  Were vital signs taken at a resting state? Yes  Focused Assessment Change from prior assessment (see assessment flowsheet)  Early Detection of Sepsis Score *See Row Information* Medium  MEWS guidelines implemented *See Row Information* No, previously red, continue vital signs every 4 hours  Treat  MEWS Interventions Escalated (See documentation below) (MD notified)  Pain Scale Faces  Faces Pain Scale 0  Take Vital Signs  Increase Vital Sign Frequency  Red: Q 1hr X 4 then Q 4hr X 4, if remains red, continue Q 4hrs  Escalate  MEWS: Escalate Red: discuss with charge nurse/RN and provider, consider discussing with RRT  Notify: Charge Nurse/RN  Name of Charge Nurse/RN Notified sharon RN  Date Charge Nurse/RN Notified 05/01/20  Time Charge Nurse/RN Notified 0615  Notify: Provider  Provider Name/Title Amponsah MD  Date Provider Notified 05/01/20  Time Provider Notified [phone]  Notification Type Page  Notification Reason  (MEWS score 4)  Response No new orders  Date of Provider Response 05/01/20  Time of Provider Response [phone]  Notify: Rapid Response  Name of Rapid Response RN Notified  (RT no)

## 2020-05-01 NOTE — Progress Notes (Signed)
Pharmacy Antibiotic Note  Travis Wallace is a 86 y.o. male admitted on 04/30/2020 with pneumonia.  Pharmacy has been consulted for vancomycin and cefepime dosing. -SCr= 1.0, CrCl ~ 45 -WBC= 15.9, tmax= 100.4  Plan: -Change Vancomycin to 1000 mg IV Q 24 hours  -Continue Cefepime 2gm IV q24h -MRSA PCR -Monitor CBC, renal fx, cultures and clinical progress   Height: 5\' 4"  (162.6 cm) Weight: 65.1 kg (143 lb 8.3 oz) (pt. weighed on zeroed bed RN aware) IBW/kg (Calculated) : 59.2  Temp (24hrs), Avg:99 F (37.2 C), Min:98.5 F (36.9 C), Max:100.4 F (38 C)  Recent Labs  Lab 04/30/20 0656 04/30/20 0800 04/30/20 0955 04/30/20 1142 04/30/20 1452 04/30/20 1830 05/01/20 0059  WBC  --  12.5*  --   --   --   --  15.9*  CREATININE  --  1.63*  --   --   --  1.10 1.03  LATICACIDVEN 4.6*  --  5.6* 6.0* 3.8* 3.7*  --     Estimated Creatinine Clearance: 42.3 mL/min (by C-G formula based on SCr of 1.03 mg/dL).    Allergies  Allergen Reactions  . Memantine Other (See Comments)    Reaction:  Bradycardia  Other reaction(s): Other (See Comments) Other reaction(s): Other (See Comments) Reaction:Bradycardia  Reaction:  Bradycardia   Other reaction(s): Other (See Comments) Reaction: Bradycardia     Antimicrobials this admission: Vanc 11/1 >>  cefepime 11/1 >>   Dose adjustments this admission:  Microbiology results: 11/1 BCx: ngtd 11/1 UCx:    Thank you for allowing pharmacy to be a part of this patient's care.  13/1, PharmD Clinical Pharmacist **Pharmacist phone directory can now be found on [email or web address] ([email or web address]).  Listed under [facility].

## 2020-05-01 NOTE — Evaluation (Signed)
Clinical/Bedside Swallow Evaluation Patient Details  Name: Leroy Pettway MRN: [id number] Date of Birth: 08/31/1930  Today's Date: 05/01/2020 Time: SLP Start Time (ACUTE ONLY): 1002 SLP Stop Time (ACUTE ONLY): 1010 SLP Time Calculation (min) (ACUTE ONLY): 8 min  Past Medical History:  Past Medical History:  Diagnosis Date  . Acute gangrenous cholecystitis 12/19/2016   s/p lap cholecystectomy 12/19/16  . Acute kidney injury (HCC)   . Alzheimer's dementia   . Alzheimer's dementia   . CAD (coronary artery disease) 12/17/2016  . Coronary artery disease   . Dementia 12/17/2016  . Depression   . Dyslipidemia   . GERD (gastroesophageal reflux disease)   . Hyperbilirubinemia   . Hypertension   . Insomnia   . OSA (obstructive sleep apnea) 12/17/2016  . Pericholecystic abscess 12/17/2016   s/p OR drainage 12/19/16  . Restless leg   . Severe sepsis (HCC) 12/17/2016  . Sinus bradycardia   . Sleep apnea    Past Surgical History:  Past Surgical History:  Procedure Laterality Date  . BACK SURGERY     x2  . CARDIAC CATHETERIZATION    . CATARACT EXTRACTION, BILATERAL    . CHOLECYSTECTOMY N/A 12/19/2016   Procedure: LAPAROSCOPIC CHOLECYSTECTOMY WITH INTRAOPERATIVE CHOLANGIOGRAM;  Surgeon: Ovidio Kin, MD;  Location: [facility];  Service: General;  Laterality: N/A;  . CORONARY ARTERY BYPASS GRAFT     X 3 VESSELS  . EYE SURGERY     HPI:  Mr.Browning is an 86 yo M w/ advanced dementia admit for respiratory distress likely due to sepsis form urinary or pulmonary source.  Consumes purees at baseline.    Assessment / Plan / Recommendation Clinical Impression  Though pt appears to wake up with verbal and tactiel stimuli from therapist, and repositioning, he never opened his eyes to respond to environment. If POs were place to his lips, pt opened mouth to accept them, but did no seal lips or orally manipulate in any way. Ice chip removed from mouth, water and puree introduced to mouth, but no response and  they were also removed. Pt is not cognitively aware of PO at this time. WIll f/u for needs.  SLP Visit Diagnosis: Dysphagia, unspecified (R13.10)    Aspiration Risk  Risk for inadequate nutrition/hydration    Diet Recommendation NPO        Other  Recommendations Oral Care Recommendations: Oral care QID   Follow up Recommendations Skilled Nursing facility      Frequency and Duration min 2x/week  2 weeks       Prognosis        Swallow Study   General HPI: Mr.Wilhide is an 86 yo M w/ advanced dementia admit for respiratory distress likely due to sepsis form urinary or pulmonary source.  Consumes purees at baseline.  Type of Study: Bedside Swallow Evaluation Previous Swallow Assessment: BSE in 2019, impulsive but able to masticate regualr solids Diet Prior to this Study: NPO Temperature Spikes Noted: Yes Respiratory Status: Nasal cannula History of Recent Intubation: No Behavior/Cognition: Lethargic/Drowsy Oral Cavity Assessment: Within Functional Limits Oral Care Completed by SLP: No Oral Cavity - Dentition: Adequate natural dentition Vision: Impaired for self-feeding Self-Feeding Abilities: Total assist Patient Positioning: Upright in bed Baseline Vocal Quality: Not observed Volitional Cough: Cognitively unable to elicit Volitional Swallow: Unable to elicit    Oral/Motor/Sensory Function Overall Oral Motor/Sensory Function: Other (comment) (unable to f/c)   Ice Chips Ice chips: Impaired Presentation: Spoon Oral Phase Impairments: Poor awareness of bolus   Thin Liquid Thin  Liquid: Impaired Presentation: Cup Oral Phase Impairments: Poor awareness of bolus    Nectar Thick Nectar Thick Liquid: Not tested   Honey Thick Honey Thick Liquid: Not tested   Puree Puree: Impaired Presentation: Spoon Oral Phase Impairments: Poor awareness of bolus   Solid            Domenick Quebedeaux, Riley Nearing 05/01/2020,10:40 AM

## 2020-05-01 NOTE — Progress Notes (Addendum)
   05/01/20 0300  Vitals  Temp 98.6 F (37 C)  Temp Source Axillary  BP 138/67  MAP (mmHg) 87  BP Location Left Arm  BP Method Automatic  Patient Position (if appropriate) Lying  Pulse Rate 90  ECG Heart Rate 89  Resp (!) 25  Level of Consciousness  Level of Consciousness Responds to Pain  MEWS COLOR  MEWS Score Color Yellow  Oxygen Therapy  SpO2 95 %  O2 Device Nasal Cannula  O2 Flow Rate (L/min) 5 L/min  MEWS Score  MEWS Temp 0  MEWS Systolic 0  MEWS Pulse 0  MEWS RR 1  MEWS LOC 2  MEWS Score 3  last lactic acid 3.7, Amponsah MD notified, no new order received will continue to monitor.

## 2020-05-01 NOTE — Progress Notes (Signed)
Patient noted with increased difficulty breathing.  Audible.  Daughter at bedside with increasing concern.  MD paged & notified.  No new orders, states he will be there soon

## 2020-05-01 NOTE — Progress Notes (Signed)
Daily Progress Note   Patient Name: Travis Wallace       Date: 05/01/2020 DOB: 02/11/1931  Age: 86 y.o. MRN#: [id number] Attending Physician: Tyson Alias, * Primary Care Physician: Karna Dupes, MD Admit Date: 04/30/2020  Reason for Follow-up: continued GOC discussion  Subjective: Patient on 5L oxygen via nasal cannula with spo2 92%. His breathing appears labored and he is tachypneic. He has also been febrile today and requiring prn tylenol.   I spoke with [name] at length regarding his current medical condition. He has been receiving supportive care now for more than 24 hours.  Discussed that the lactic acidosis and AKI has improved, but his respiratory status seems to have worsened and he is minimally responsive.   Discussed at length the trajectory of advanced dementia. Discussed that irreversible dysphagia is unfortunately very common in patients with advanced dementia. Focused on high probability of recurrent aspiration pneumonia. Explained that it is a terminal condition.  Daughter verbalizes understand and acceptance of patient's illness trajectory. Emotional support provided.  Recommended transitioning to comfort care at this point, and daughter agrees with this plan of care. Dicussed in detail what comfort care means--keeping him clean and dry, no labs, no artificial hydration or feeding, no antibiotics, minimizing of medications, and medication for pain and dyspnea.   Provided education and counseling on expectations at EOL. Provided daughter with a copy of "Gone from my sight".   Discussed possible transfer to residential hospice if patient remains stable overnight. Daughter lives in [place] and would prefer hospice facility in [facility] if possible, but she is open to  [facility] if they have a bed available sooner.    Primary decision maker: [name] (daughter and HCPOA) [phone]  Length of Stay: 1  Current Medications: Scheduled Meds:  . [START ON 05/02/2020] enoxaparin (LOVENOX) injection  40 mg Subcutaneous Daily    Continuous Infusions: . ceFEPime (MAXIPIME) IV Stopped (04/30/20 1453)  . dextrose 5 % and 0.45% NaCl 100 mL/hr at 05/01/20 1212  . vancomycin 1,000 mg (05/01/20 1210)    PRN Meds: acetaminophen **OR** acetaminophen, morphine injection  Physical Exam Vitals reviewed.  Constitutional:      Appearance: He is ill-appearing.  Cardiovascular:     Rate and Rhythm: Normal rate. Rhythm irregular.  Pulmonary:     Effort: Tachypnea  present.     Comments: Breathing mildly labored Neurological:     Comments: Minimally responsive             Vital Signs: BP (!) 156/71 (BP Location: Left Arm)   Pulse (!) 44   Temp 99.3 F (37.4 C) (Axillary)   Resp (!) 25   Ht 5\' 4"  (1.626 m)   Wt 65.1 kg Comment: pt. weighed on zeroed bed RN aware  SpO2 95%   BMI 24.64 kg/m  SpO2: SpO2: 95 % O2 Device: O2 Device: Nasal Cannula O2 Flow Rate: O2 Flow Rate (L/min): 5 L/min  Intake/output summary:   Intake/Output Summary (Last 24 hours) at 05/01/2020 1519 Last data filed at 05/01/2020 0603 Gross per 24 hour  Intake 100 ml  Output 200 ml  Net -100 ml   LBM:   Baseline Weight: Weight: 68.5 kg Most recent weight: Weight: 65.1 kg (pt. weighed on zeroed bed RN aware)       Palliative Assessment/Data: PPS 10%      Palliative Care Assessment & Plan   HPI/Patient Profile: 86 y.o. male  with past medical history of Alzheimer's dementia, HTN, CAD, and OSA. He presented to [facility] emergency department on 04/30/2020 with respiratory distress.  ED Course: Patient initially placed on 15L NRB. Lactic acid 4.6-->5.6, creatinine 1.63  Assessment: - advanced dementia - Sepsis and acute respiratory failure, likely secondary to aspiration  pneumonia - lactic acidosis - acute kidney injury - terminal care  Recommendations/Plan:  Full comfort measures initiated  DNR/DNI as previously documented  Transfer to residential hospice when bed becomes available - TOC consult placed - first preference is facility in [facility], 2nd preference is [facility]   Added orders for symptom management at EOL as well as discontinued orders not focused on comfort  Unrestricted visitation orders were placed per current [place] COVID-19 EOL visitation policy   Provide frequent assessments and administer PRN medications as clinically necessary to ensure EOL comfort  PMT will continue to follow holistically   Goals of Care and Additional Recommendations:  Limitations on Scope of Treatment: Full Comfort Care  Code Status:  DNR/DNI   Prognosis:   < 2 weeks  Discharge Planning:  Hospice facility  Care plan was discussed with Dr. [employer], primary RN  Thank you for allowing the Palliative Medicine Team to assist in the care of this patient.   Total Time 67 minutes Prolonged Time Billed  yes       Greater than 50%  of this time was spent counseling and coordinating care related to the above assessment and plan.  Marijo Conception, NP  Please contact Palliative Medicine Team phone at [phone] for questions and concerns.

## 2020-05-01 NOTE — Progress Notes (Signed)
PHARMACY - PHYSICIAN COMMUNICATION CRITICAL VALUE ALERT - BLOOD CULTURE IDENTIFICATION (BCID)  Travis Wallace is an 86 y.o. male who presented to [facility] on 04/30/2020 with a chief complaint of shortness of breath  Assessment:  One blood positive bottle positive for gram positive rods.  Suspect contamination  Name of physician (or Provider) Contacted: Dr. Marijo Conception paged  Current antibiotics: vancomycin + cefepime  Changes to prescribed antibiotics recommended: None   No results found for this or any previous visit.  [name], [name] 05/01/2020  10:16 AM

## 2020-05-02 DIAGNOSIS — F0281 Dementia in other diseases classified elsewhere with behavioral disturbance: Secondary | ICD-10-CM

## 2020-05-02 DIAGNOSIS — R1310 Dysphagia, unspecified: Secondary | ICD-10-CM

## 2020-05-02 DIAGNOSIS — R0600 Dyspnea, unspecified: Secondary | ICD-10-CM

## 2020-05-02 MED ORDER — GLYCOPYRROLATE 0.2 MG/ML IJ SOLN
0.3000 mg | INTRAMUSCULAR | Status: DC
  Administered 2020-05-02 (×2): 0.3 mg via INTRAVENOUS
  Filled 2020-05-02 (×2): qty 2

## 2020-05-02 MED ORDER — LORAZEPAM 2 MG/ML PO CONC: 1.0000 mg | ORAL | 0 refills | Status: AC | PRN

## 2020-05-02 MED ORDER — MORPHINE SULFATE (PF) 2 MG/ML IV SOLN: 1.0000 mg | INTRAVENOUS | 0 refills | Status: AC | PRN

## 2020-05-02 MED ORDER — LORAZEPAM 1 MG PO TABS: 1.0000 mg | ORAL_TABLET | ORAL | 0 refills | Status: AC | PRN

## 2020-05-02 MED ORDER — GLYCOPYRROLATE 0.2 MG/ML IJ SOLN: 0.3000 mg | INTRAMUSCULAR | 0 refills | Status: AC

## 2020-05-02 MED ORDER — BIOTENE DRY MOUTH MT LIQD: 15.0000 mL | OROMUCOSAL | Status: AC | PRN

## 2020-05-02 NOTE — TOC Transition Note (Signed)
Transition of Care (TOC) - CM/SW Discharge Note Donn Pierini RN, BSN Transitions of Care Unit 4E- RN Case Manager See Treatment Team for direct phone #    Patient Details  Name: Travis Wallace MRN: [id number] Date of Birth: August 12, 1930  Transition of Care [facility]) CM/SW Contact:  Darrold Span, RN Phone Number: 05/02/2020, 3:08 PM   Clinical Narrative:    CSW has been notified by Cheri with [facility] that pt has a bed available at their residential Hospice home for today. Pt will be transported via [facility] to [facility],  [facility] contacted for transport and transport scheduled for 1630 this afternoon as per CSW. Paperwork and GOLD DNR placed on shadow chart.    Final next level of care: Hospice Medical Facility Barriers to Discharge: No Barriers Identified   Patient Goals and CMS Choice Patient states their goals for this hospitalization and ongoing recovery are:: comfort CMS Medicare.gov Compare Post Acute Care list provided to:: Patient Represenative (must comment) (daughter) Choice offered to / list presented to : Adult Children  Discharge Placement               Hospice Home        Discharge Plan and Services In-house Referral: Clinical Social Work   Post Acute Care Choice: Hospice                      HH Agency: [facility] Date [facility] Agency Contacted: 05/02/20 Time HH Agency Contacted: 1100 Representative spoke with at [facility] Agency: Cheri  Social Determinants of Health (SDOH) Interventions     Readmission Risk Interventions Readmission Risk Prevention Plan 05/02/2020  Transportation Screening Complete  PCP or Specialist Appt within 5-7 Days (No Data)  Home Care Screening Complete  Medication Review (RN CM) Complete  Some recent data might be hidden

## 2020-05-02 NOTE — Social Work (Signed)
CSW consulted for residential hospice- referral was made by [place],NP to [facility].   CSW contacted Cheri/[facility] - They will follow up with CSW once they have an decision- possible admit today  CSW will continue to follow and assist with discharge planning.  Antony Blackbird, MSW, LCSWA Clinical Social Worker

## 2020-05-02 NOTE — Progress Notes (Signed)
   Subjective:  Patient evaluated at bedside this AM. Patient appears comfortable at this time. Daughter is at bedside. Endorses that he looks comfortable to her. Palliative care is in process of arranging for hospice bed.   Objective:  Vital signs in last 24 hours: Vitals:   05/01/20 2000 05/01/20 2300 05/01/20 2344 05/02/20 0400  BP: (!) 158/68 (!) 157/80  131/65  Pulse: 80 79 79   Resp: (!) 24 17 (!) 21 (!) 25  Temp: 98.1 F (36.7 C)  98.4 F (36.9 C) 98.5 F (36.9 C)  TempSrc: Axillary  Oral Oral  SpO2: 98% 95% 98% 95%  Weight:      Height:       Physical Exam: General: Chronically ill-appearing, no acute distress CV: Tachycardic. Pulm: Rhonchi, rales present bilaterally. Neuro: Does not open eyes or respond to commands.  Assessment/Plan: Travis Wallace is 86yo male with severe dementia, CAD s/p CABG, R hip fracture admitted 11/1 with sepsis and respiratory distress, now transitioned to comfort care given overall health status and low likelihood of meaningful recovery.  Principal Problem:   Sepsis (HCC) Active Problems:   Acute kidney injury (HCC)   Alzheimer's dementia (HCC)   Dysphagia   Hypernatremia  #Goals of care #Severe dementia Yesterday patient continued to be febrile, increased work of breathing, and appeared more uncomfortable throughout the day. We had a discussion with the daughter at bedside, as well as with Palliative. Travis Wallace' daughter agreed patient seemed very uncomfortable and mentioned patient would not want to be kept alive in this state. After discussing with her brother, family transitioned to comfort care. IVF, abx d/c'd. Plan to transfer to hospice facility, as Travis Wallace likely has a prognosis of less than two weeks. - IV morphine, ativan, robinul prn - Waiting for hospice bed  Prior to Admission Living Arrangement: Nursing home Anticipated Discharge Location: Hospice Barriers to Discharge: pending hospice bed Dispo: Anticipated discharge in  approximately 0-1 day(s).   Evlyn Kanner, MD 05/02/2020, 6:28 AM Pager: [phone] After 5pm on weekdays and 1pm on weekends: On Call pager [phone]

## 2020-05-02 NOTE — Discharge Summary (Addendum)
Name: Travis Wallace MRN: [id number] DOB: [birth year] 86 y.o. PCP: Karna Dupes, MD  Date of Admission: 04/30/2020  6:41 AM Date of Discharge:  05/02/2020 Attending Physician: Tyson Alias, *  Discharge Diagnosis: 1. Sepsis due to aspiration pneumonia 2. Severe dementia 3. Hypernatremia 4. Severe protein calorie malnutrition  Discharge Medications: Allergies as of 05/02/2020       Reactions   Memantine Other (See Comments)   Reaction:  Bradycardia  Other reaction(s): Other (See Comments) Other reaction(s): Other (See Comments) Reaction:  Bradycardia  Reaction:  Bradycardia  Other reaction(s): Other (See Comments) Reaction:  Bradycardia         Medication List     STOP taking these medications    acetaminophen 325 MG tablet Commonly known as: TYLENOL   aspirin EC 81 MG tablet   calcium carbonate 500 MG chewable tablet Commonly known as: TUMS - dosed in mg elemental calcium   divalproex 125 MG capsule Commonly known as: DEPAKOTE SPRINKLE   feeding supplement (PRO-STAT SUGAR FREE 64) Liqd   lactulose 10 GM/15ML solution Commonly known as: CHRONULAC   psyllium 95 % Pack Commonly known as: HYDROCIL/METAMUCIL   QUEtiapine 25 MG tablet Commonly known as: SEROQUEL   rOPINIRole 1 MG tablet Commonly known as: REQUIP   senna 8.6 MG Tabs tablet Commonly known as: SENOKOT   tamsulosin 0.4 MG Caps capsule Commonly known as: Flomax       TAKE these medications    antiseptic oral rinse Liqd Apply 15 mLs topically as needed for dry mouth.   glycopyrrolate 0.2 MG/ML injection Commonly known as: ROBINUL Inject 1.5 mLs (0.3 mg total) into the vein every 4 (four) hours.   LORazepam 1 MG tablet Commonly known as: ATIVAN Take 1 tablet (1 mg total) by mouth every 4 (four) hours as needed for anxiety.   LORazepam 2 MG/ML concentrated solution Commonly known as: ATIVAN Place 0.5 mLs (1 mg total) under the tongue every 4 (four) hours as needed for  anxiety.   morphine 2 MG/ML injection Inject 0.5 mLs (1 mg total) into the vein every 2 (two) hours as needed (or dyspnea).        Disposition and follow-up:   [name] was discharged from [facility] in Stable condition.  At the hospital follow up visit please address:  1.  Patient d/c to hospice.  2.  Labs / imaging needed at time of follow-up: n/a  3.  Pending labs/ test needing follow-up: n/a  Follow-up Appointments:  n/a  Hospital Course by problem list: 1. Sepsis due to aspiration pneumonia: Patient presented to the hospital with worsening functional state, acute hypoxic respiratory failure requiring 15 L of supplemental oxygen, lactic acidosis, and acute kidney injury.  On exam he had crackles at the bilateral bases consistent with most likely aspiration event.  He was treated with IV fluids and empiric antibiotics.  After one day of vancomycin and cefepime, patient appeared uncomfortable, tachypneic to mid-30's. Family decided to transition to comfort care and transfer to hospice.  Palliative care service consulted with the patient, he responded well to intravenous morphine with no further signs of discomfort or air hunger.  Prognosis is likely days, less than 2 weeks.  2. Severe dementia: Patient lives in nursing home, GCS 6 at baseline. After one day of treatment, family elected to stop treatment for Travis Wallace to transition to end of life care in comfort.   3.  Severe hypernatremia: Patient showed signs of dehydration and  free water deficit.  Initially treated with lactated Ringer's given his sepsis, then transition to D5 half-normal saline.  Had some improvement in his serum sodium.  Goals of care transition to comfort measures and IV fluids were discontinued.  Discharge Vitals:   BP (!) 165/80 (BP Location: Left Arm)    Pulse (!) 46    Temp 97.8 F (36.6 C) (Axillary)    Resp (!) 21    Ht 5\' 4"  (1.626 m)    Wt 65.1 kg Comment: pt. weighed on zeroed  bed RN aware   SpO2 95%    BMI 24.64 kg/m   Pertinent Labs, Studies, and Procedures:  CBC Latest Ref Rng & Units 05/01/2020 04/30/2020 09/04/2017  WBC 4.0 - 10.5 K/uL 15.9(H) 12.5(H) 5.7  Hemoglobin 13.0 - 17.0 g/dL 11/04/2017 29.7 11.6(L)  Hematocrit 39 - 52 % 50.1 55.7(H) 35.8(L)  Platelets 150 - 400 K/uL 151 198 278   BMP Latest Ref Rng & Units 05/01/2020 04/30/2020 04/30/2020  Glucose 70 - 99 mg/dL 13/07/2019) 211(H) 417(E)  BUN 8 - 23 mg/dL 081(K) 48(J) 85(U)  Creatinine 0.61 - 1.24 mg/dL 31(S 9.70 2.63)  Sodium 135 - 145 mmol/L 152(H) 151(H) 155(H)  Potassium 3.5 - 5.1 mmol/L 3.6 3.4(L) 3.9  Chloride 98 - 111 mmol/L 117(H) 118(H) 119(H)  CO2 22 - 32 mmol/L 23 24 20(L)  Calcium 8.9 - 10.3 mg/dL 7.85(Y) 8.5(O) 2.7(X)   Discharge Instructions:  Travis Wallace and family,  It was a pleasure meeting you all. As Travis Wallace is nearing the end of life, I am glad we were able to assist in making him feel more comfortable. This is never an easy time, but I hope the faculty and staff at [facility] helped make the transition to hospice as seamless as possible. I wish you all the best and will be keeping you in my thoughts in prayers during this time.   Thank you,  [facility], MD   Signed: Evlyn Kanner, MD 05/02/2020, 10:48 AM   Pager: [phone]

## 2020-05-02 NOTE — Social Work (Signed)
CSW received call- [facility] has confirmed bed availability for today. CSW confirmed discharge plan with [name].  Patient will DC to: [facility]  DC Date: 05/02/2020 Family Notified: [name],daughter Transport By: Sharin Mons @ 4:30pm  Per MD patient is ready for discharge. RN, patient, and facility notified of DC. Discharge Summary sent to facility. RN given number for [phone]. Ambulance transport requested for patient.   Clinical Social Worker signing off.  Antony Blackbird, MSW, LCSWA Clinical Social Worker

## 2020-05-02 NOTE — Progress Notes (Signed)
Daily Progress Note   Patient Name: Travis Wallace       Date: 05/02/2020 DOB: Nov 15, 1930  Age: 86 y.o. MRN#: [id number] Attending Physician: Tyson Alias, * Primary Care Physician: Karna Dupes, MD Admit Date: 04/30/2020  Reason for Follow-up: end of life care, symptom management  Subjective: Patient appears comfortable. Respirations are even and shallow. He continues to have copious respiratory secretions, despite prn robinul administered at 0256 and 0914.   Daughter and son are at bedside. They express appreciation for the nursing care patient has received on 4E and that he has been kept comfortable.   Length of Stay: 2  Current Medications: Scheduled Meds:  . glycopyrrolate  0.3 mg Intravenous Q4H     PRN Meds: acetaminophen **OR** acetaminophen, antiseptic oral rinse, LORazepam **OR** LORazepam **OR** LORazepam, morphine injection, polyvinyl alcohol  Physical Exam Vitals reviewed.  Cardiovascular:     Rate and Rhythm: Normal rate. Rhythm irregular.  Neurological:     Comments: Unresponsive to voice or light touch             Vital Signs: BP (!) 165/80 (BP Location: Left Arm)   Pulse (!) 46   Temp 97.8 F (36.6 C) (Axillary)   Resp (!) 21   Ht 5\' 4"  (1.626 m)   Wt 65.1 kg Comment: pt. weighed on zeroed bed RN aware  SpO2 95%   BMI 24.64 kg/m  SpO2: SpO2: 95 % O2 Device: O2 Device: Nasal Cannula O2 Flow Rate: O2 Flow Rate (L/min): 5 L/min  Intake/output summary: No intake or output data in the 24 hours ending 05/02/20 1048 LBM:   Baseline Weight: Weight: 68.5 kg Most recent weight: Weight: 65.1 kg (pt. weighed on zeroed bed RN aware)       Palliative Assessment/Data: PPS 10%        Palliative Care Assessment & Plan   HPI/Patient Profile:86  y.o.malewith past medical history of Alzheimer's dementia, HTN, CAD, and OSA. He [facility] department[facility]/2021with respiratory distress.In the ED, patient initially placed on 15L NRB.Lactic acid 4.6-->5.6, creatinine 1.63. Urine culture came back negative.   Assessment: - advanced dementia - Sepsis and acute respiratory failure, likely secondary to aspiration pneumonia - lactic acidosis - acute kidney injury - terminal care  Recommendations/Plan: - continue full comfort measures  - changed robinul  from prn to scheduled every 4 hours and increased dose to 0.3 mg - transfer to residential hospice when bed is available; patient is stable for transfer at this time  Goals of Care and Additional Recommendations:  Limitations on Scope of Treatment: Full Comfort Care  Code Status:  DNR/DNI  Prognosis:   days  Discharge Planning:  Hospice facility  Care plan was discussed with bedside RN and hospice liaison  Thank you for allowing the Palliative Medicine Team to assist in the care of this patient.   Total Time 15 minutes Prolonged Time Billed  no       Greater than 50%  of this time was spent counseling and coordinating care related to the above assessment and plan.  Merry Proud, NP  Please contact Palliative Medicine Team phone at [phone] for questions and concerns.

## 2020-05-03 LAB — CULTURE, BLOOD (ROUTINE X 2): Special Requests: ADEQUATE

## 2020-05-05 LAB — CULTURE, BLOOD (ROUTINE X 2): Culture: NO GROWTH

## 2020-05-09 DIAGNOSIS — Z515 Encounter for palliative care: Secondary | ICD-10-CM

## 2020-05-09 DIAGNOSIS — R0600 Dyspnea, unspecified: Secondary | ICD-10-CM

## 2020-05-30 DEATH — deceased

## 2022-03-24 IMAGING — DX DG CHEST 1V PORT
1 series · 1 of 1 positions shown · non-contrast
Comparison: August 28, 2017.

CLINICAL DATA: Shortness of breath.

EXAM:
PORTABLE CHEST 1 VIEW

[chest]
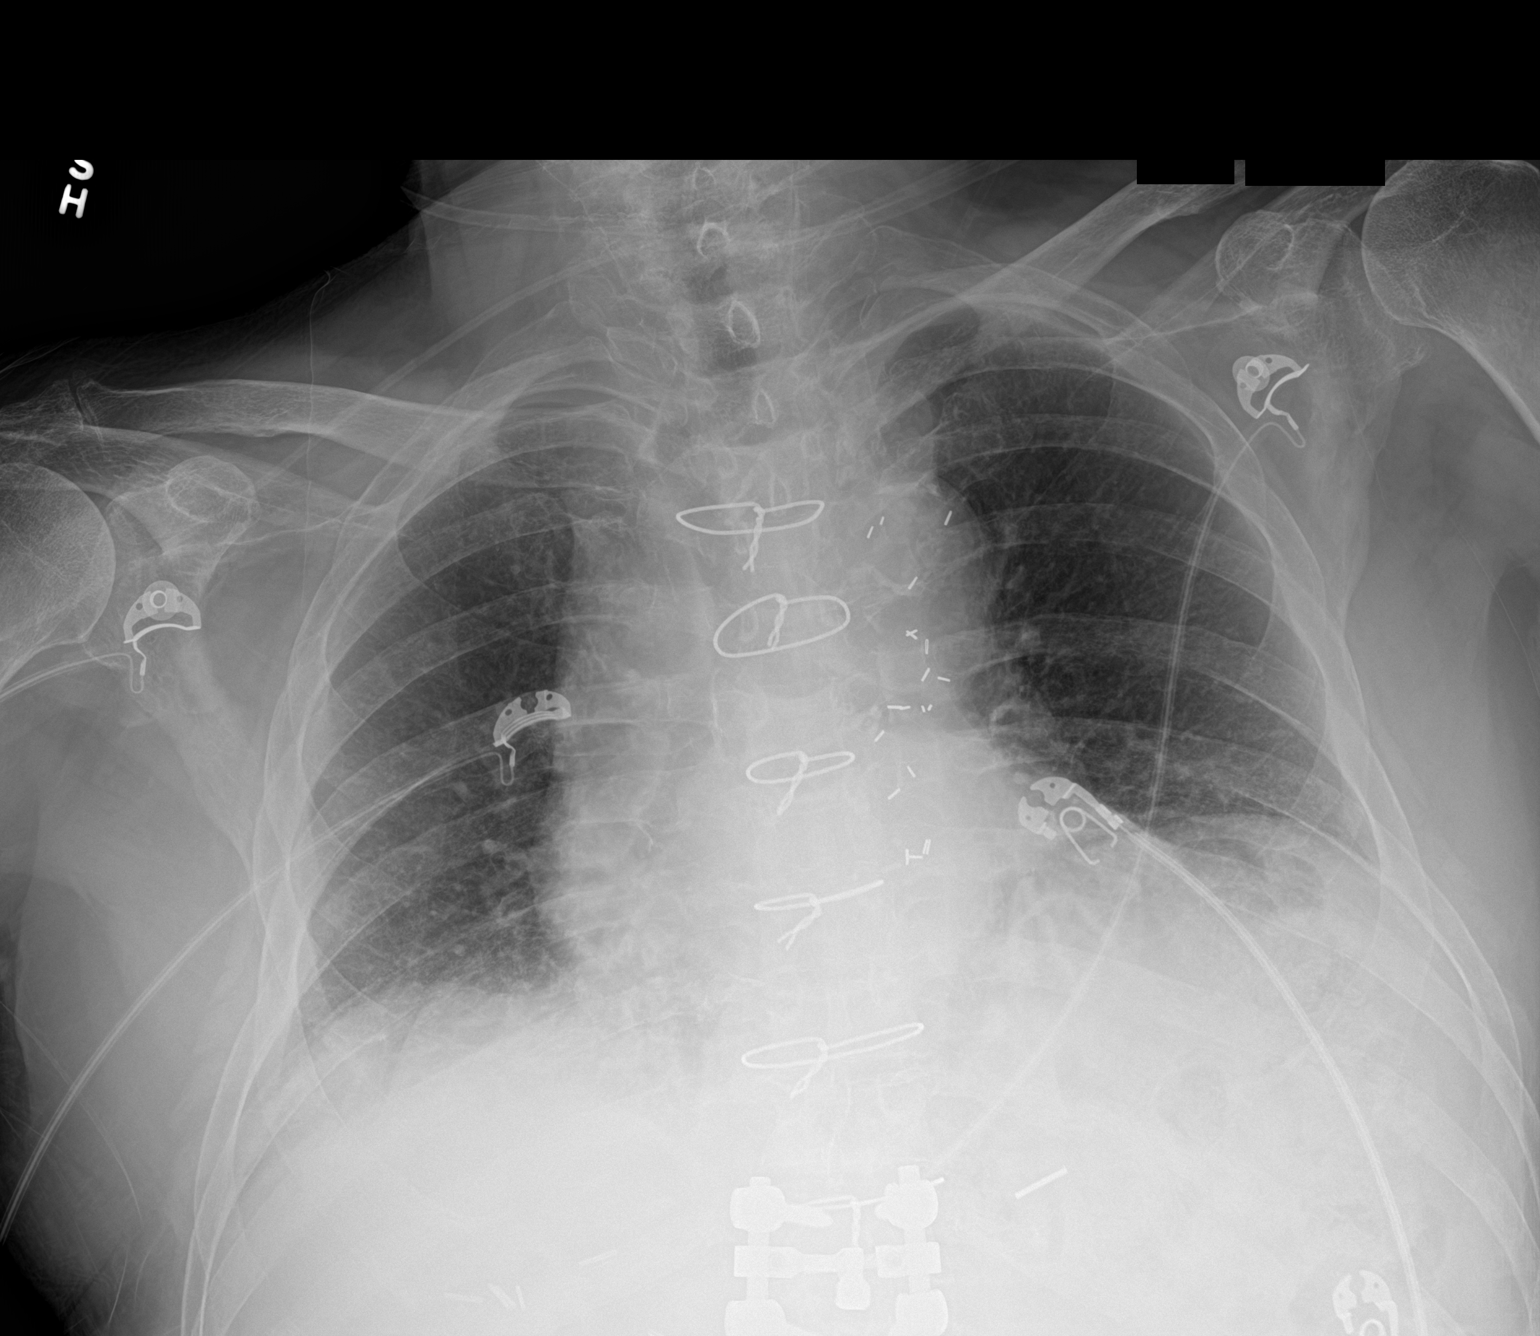

[1 of 1 positions shown; findings below may reference images not displayed]

FINDINGS: Stable cardiomegaly. No pneumothorax is noted. Hypoinflation of the
lungs is noted with minimal bibasilar subsegmental atelectasis. Bony
thorax is unremarkable. No effusion is noted.
IMPRESSION: Hypoinflation of the lungs with minimal bibasilar subsegmental
atelectasis.
